# Patient Record
Sex: Female | Born: 1960
Health system: Southern US, Community
[De-identification: ages and names within clinical notes are randomized; demographics above are authoritative.]

## PROBLEM LIST (undated history)

## (undated) DIAGNOSIS — Z923 Personal history of irradiation: Secondary | ICD-10-CM

## (undated) DIAGNOSIS — Z853 Personal history of malignant neoplasm of breast: Secondary | ICD-10-CM

## (undated) DIAGNOSIS — R350 Frequency of micturition: Secondary | ICD-10-CM

## (undated) DIAGNOSIS — Z973 Presence of spectacles and contact lenses: Secondary | ICD-10-CM

## (undated) DIAGNOSIS — C50919 Malignant neoplasm of unspecified site of unspecified female breast: Secondary | ICD-10-CM

## (undated) DIAGNOSIS — D649 Anemia, unspecified: Secondary | ICD-10-CM

## (undated) DIAGNOSIS — Z9221 Personal history of antineoplastic chemotherapy: Secondary | ICD-10-CM

## (undated) DIAGNOSIS — N3281 Overactive bladder: Secondary | ICD-10-CM

## (undated) DIAGNOSIS — R319 Hematuria, unspecified: Secondary | ICD-10-CM

## (undated) DIAGNOSIS — D494 Neoplasm of unspecified behavior of bladder: Secondary | ICD-10-CM

## (undated) HISTORY — PX: BREAST LUMPECTOMY WITH AXILLARY LYMPH NODE DISSECTION: SHX5756

---

## 1985-04-10 HISTORY — PX: APPENDECTOMY: SHX54

## 2008-06-08 HISTORY — PX: NASAL SINUS SURGERY: SHX719

## 2009-08-16 ENCOUNTER — Encounter: Admission: RE | Admit: 2009-08-16 | Discharge: 2009-08-16 | Payer: Self-pay | Admitting: Family Medicine

## 2010-04-10 DIAGNOSIS — C50919 Malignant neoplasm of unspecified site of unspecified female breast: Secondary | ICD-10-CM

## 2010-04-10 DIAGNOSIS — Z923 Personal history of irradiation: Secondary | ICD-10-CM

## 2010-04-10 HISTORY — DX: Personal history of irradiation: Z92.3

## 2010-04-10 HISTORY — DX: Malignant neoplasm of unspecified site of unspecified female breast: C50.919

## 2010-04-10 HISTORY — PX: BREAST LUMPECTOMY: SHX2

## 2010-08-11 ENCOUNTER — Other Ambulatory Visit: Payer: Self-pay | Admitting: Family Medicine

## 2010-08-11 DIAGNOSIS — Z1231 Encounter for screening mammogram for malignant neoplasm of breast: Secondary | ICD-10-CM

## 2010-08-22 ENCOUNTER — Ambulatory Visit
Admission: RE | Admit: 2010-08-22 | Discharge: 2010-08-22 | Disposition: A | Discharge status: Home / Self Care | Payer: BC Managed Care – PPO | Source: Ambulatory Visit | Attending: Family Medicine | Admitting: Family Medicine

## 2010-08-22 DIAGNOSIS — Z1231 Encounter for screening mammogram for malignant neoplasm of breast: Secondary | ICD-10-CM

## 2010-08-24 ENCOUNTER — Other Ambulatory Visit: Payer: Self-pay | Admitting: Family Medicine

## 2010-08-24 DIAGNOSIS — R928 Other abnormal and inconclusive findings on diagnostic imaging of breast: Secondary | ICD-10-CM

## 2010-09-07 ENCOUNTER — Other Ambulatory Visit: Payer: Self-pay | Admitting: Family Medicine

## 2010-09-07 ENCOUNTER — Ambulatory Visit
Admission: RE | Admit: 2010-09-07 | Discharge: 2010-09-07 | Disposition: A | Discharge status: Home / Self Care | Payer: BC Managed Care – PPO | Source: Ambulatory Visit | Attending: Family Medicine | Admitting: Family Medicine

## 2010-09-07 ENCOUNTER — Other Ambulatory Visit: Payer: Self-pay | Admitting: Diagnostic Radiology

## 2010-09-07 DIAGNOSIS — R928 Other abnormal and inconclusive findings on diagnostic imaging of breast: Secondary | ICD-10-CM

## 2010-09-08 ENCOUNTER — Ambulatory Visit
Admission: RE | Admit: 2010-09-08 | Discharge: 2010-09-08 | Disposition: A | Discharge status: Home / Self Care | Payer: BC Managed Care – PPO | Source: Ambulatory Visit | Attending: Family Medicine | Admitting: Family Medicine

## 2010-09-08 ENCOUNTER — Other Ambulatory Visit: Payer: Self-pay | Admitting: Family Medicine

## 2010-09-08 DIAGNOSIS — C50912 Malignant neoplasm of unspecified site of left female breast: Secondary | ICD-10-CM

## 2010-09-08 DIAGNOSIS — R928 Other abnormal and inconclusive findings on diagnostic imaging of breast: Secondary | ICD-10-CM

## 2010-09-12 ENCOUNTER — Ambulatory Visit
Admission: RE | Admit: 2010-09-12 | Discharge: 2010-09-12 | Disposition: A | Discharge status: Home / Self Care | Payer: BC Managed Care – PPO | Source: Ambulatory Visit | Attending: Family Medicine | Admitting: Family Medicine

## 2010-09-12 DIAGNOSIS — C50912 Malignant neoplasm of unspecified site of left female breast: Secondary | ICD-10-CM

## 2010-09-12 MED ORDER — GADOBENATE DIMEGLUMINE 529 MG/ML IV SOLN
15.0000 mL | Freq: Once | INTRAVENOUS | Status: AC | PRN
  Administered 2010-09-12: 15 mL via INTRAVENOUS

## 2010-09-14 ENCOUNTER — Other Ambulatory Visit: Payer: Self-pay | Admitting: Oncology

## 2010-09-14 ENCOUNTER — Other Ambulatory Visit (HOSPITAL_COMMUNITY): Payer: Self-pay | Admitting: Surgery

## 2010-09-14 ENCOUNTER — Encounter (HOSPITAL_BASED_OUTPATIENT_CLINIC_OR_DEPARTMENT_OTHER): Payer: BC Managed Care – PPO | Admitting: Oncology

## 2010-09-14 ENCOUNTER — Other Ambulatory Visit: Payer: Self-pay | Admitting: Surgery

## 2010-09-14 DIAGNOSIS — Z853 Personal history of malignant neoplasm of breast: Secondary | ICD-10-CM

## 2010-09-14 DIAGNOSIS — C50219 Malignant neoplasm of upper-inner quadrant of unspecified female breast: Secondary | ICD-10-CM

## 2010-09-14 DIAGNOSIS — C50912 Malignant neoplasm of unspecified site of left female breast: Secondary | ICD-10-CM

## 2010-09-14 LAB — COMPREHENSIVE METABOLIC PANEL
AST: 14 U/L (ref 0–37)
Albumin: 3.5 g/dL (ref 3.5–5.2)
Alkaline Phosphatase: 57 U/L (ref 39–117)
BUN: 22 mg/dL (ref 6–23)
CO2: 29 mEq/L (ref 19–32)
Creatinine, Ser: 0.98 mg/dL (ref 0.50–1.10)
Glucose, Bld: 84 mg/dL (ref 70–99)
Potassium: 4.4 mEq/L (ref 3.5–5.3)
Sodium: 141 mEq/L (ref 135–145)
Total Bilirubin: 1 mg/dL (ref 0.3–1.2)

## 2010-09-14 LAB — CBC WITH DIFFERENTIAL/PLATELET
Eosinophils Absolute: 0.1 10*3/uL (ref 0.0–0.5)
MCH: 33 pg (ref 25.1–34.0)
MCV: 95.5 fL (ref 79.5–101.0)
Platelets: 252 10*3/uL (ref 145–400)
RDW: 12.7 % (ref 11.2–14.5)

## 2010-09-19 ENCOUNTER — Other Ambulatory Visit: Payer: Self-pay | Admitting: Surgery

## 2010-09-19 ENCOUNTER — Encounter (HOSPITAL_BASED_OUTPATIENT_CLINIC_OR_DEPARTMENT_OTHER)
Admission: RE | Admit: 2010-09-19 | Discharge: 2010-09-19 | Disposition: A | Discharge status: Home / Self Care | Payer: BC Managed Care – PPO | Source: Ambulatory Visit | Attending: Surgery | Admitting: Surgery

## 2010-09-19 ENCOUNTER — Ambulatory Visit
Admission: RE | Admit: 2010-09-19 | Discharge: 2010-09-19 | Disposition: A | Discharge status: Home / Self Care | Payer: BC Managed Care – PPO | Source: Ambulatory Visit | Attending: Surgery | Admitting: Surgery

## 2010-09-19 DIAGNOSIS — Z01811 Encounter for preprocedural respiratory examination: Secondary | ICD-10-CM

## 2010-09-21 ENCOUNTER — Ambulatory Visit (HOSPITAL_COMMUNITY)
Admission: RE | Admit: 2010-09-21 | Discharge: 2010-09-21 | Disposition: A | Discharge status: Home / Self Care | Payer: BC Managed Care – PPO | Source: Ambulatory Visit | Attending: Surgery | Admitting: Surgery

## 2010-09-21 ENCOUNTER — Ambulatory Visit
Admission: RE | Admit: 2010-09-21 | Discharge: 2010-09-21 | Disposition: A | Discharge status: Home / Self Care | Payer: BC Managed Care – PPO | Source: Ambulatory Visit | Attending: Surgery | Admitting: Surgery

## 2010-09-21 ENCOUNTER — Other Ambulatory Visit (INDEPENDENT_AMBULATORY_CARE_PROVIDER_SITE_OTHER): Payer: Self-pay | Admitting: Surgery

## 2010-09-21 ENCOUNTER — Ambulatory Visit (HOSPITAL_BASED_OUTPATIENT_CLINIC_OR_DEPARTMENT_OTHER)
Admission: RE | Admit: 2010-09-21 | Discharge: 2010-09-21 | Disposition: A | Discharge status: Home / Self Care | Payer: BC Managed Care – PPO | Source: Ambulatory Visit | Attending: Surgery | Admitting: Surgery

## 2010-09-21 DIAGNOSIS — Z01812 Encounter for preprocedural laboratory examination: Secondary | ICD-10-CM | POA: Insufficient documentation

## 2010-09-21 DIAGNOSIS — C50919 Malignant neoplasm of unspecified site of unspecified female breast: Secondary | ICD-10-CM | POA: Insufficient documentation

## 2010-09-21 DIAGNOSIS — C50912 Malignant neoplasm of unspecified site of left female breast: Secondary | ICD-10-CM

## 2010-09-21 DIAGNOSIS — D0590 Unspecified type of carcinoma in situ of unspecified breast: Secondary | ICD-10-CM | POA: Insufficient documentation

## 2010-09-21 DIAGNOSIS — Z01810 Encounter for preprocedural cardiovascular examination: Secondary | ICD-10-CM | POA: Insufficient documentation

## 2010-09-21 DIAGNOSIS — Z01818 Encounter for other preprocedural examination: Secondary | ICD-10-CM | POA: Insufficient documentation

## 2010-09-21 LAB — POCT HEMOGLOBIN-HEMACUE: Hemoglobin: 13.3 g/dL (ref 12.0–15.0)

## 2010-09-21 MED ORDER — TECHNETIUM TC 99M SULFUR COLLOID FILTERED
1.0000 | Freq: Once | INTRAVENOUS | Status: AC | PRN
  Administered 2010-09-21: 1 via INTRADERMAL

## 2010-09-23 NOTE — Op Note (Addendum)
Julie Villa, Julie Villa                  ACCOUNT NO.:  [id number]  MEDICAL RECORD NO.:  [id number]  LOCATION:  [facility]                          FACILITY:  [facility]  PHYSICIAN:  Currie Paris, M.D.DATE OF BIRTH:  Nov 24, 1960  DATE OF PROCEDURE:  09/21/2010 DATE OF DISCHARGE:                              OPERATIVE REPORT   PREOPERATIVE DIAGNOSIS:  Carcinoma left breast central to upper inner quadrant, clinical stage I.  POSTOPERATIVE DIAGNOSIS:  Carcinoma left breast central to upper inner quadrant, clinical stage I.  PROCEDURE:  Left needle local lumpectomy with blue dye injection and axillary sentinel lymph node biopsy (two lymph nodes).  SURGEON:  Currie Paris, MD  ANESTHESIA:  General.  CLINICAL HISTORY:  This is a 50 year old lady recently found to have a small left breast mass, which seemed to be just into the upper inner quadrant, but somewhat centrally located.  Clinically, this was a stage I.  After consultation with medicine and radiation oncologist, we elected to proceed to a needle-guided lumpectomy and sentinel node evaluation.  DESCRIPTION OF PROCEDURE:  I saw the patient in the holding area and she had no further questions.  We confirmed the left side as the operative side and I initialed that.  I reviewed the localizing films and the guidewire entered in the 12 o'clock position on and on ML view appeared to track inferiorly.  With the patient supine, it appeared to track more laterally.  The patient was taken to the operating room and after satisfactory general anesthesia had been obtained, the time-out was done.  I injected about 5 mL of dilute methylene blue subareolarly on the left.  This was massaged in.  Full prep and drape was done.  I made a curvilinear incision just above the areolar margin and elevated a thin skin flap until I could manipulate the guidewire into the wound. I tracked the guidewire through the breast tissue as it appeared to  be several centimeters before we got to the actual lesion and then grasped the tract of the guidewire and the wire with an Allis clamp and elevated it.  I then took what appeared as a cylinder of tissue around the guidewire until I was beyond the tip.  I could palpate what I thought was the tumor, which was very small perhaps only a centimeter in the mass and it appeared to be along the more medial edge.  However, by palpation and visually I appeared to be around it.  The specimen was inked and a specimen mammogram was done showing the clip and what appeared to be a lesion in this tissue.  I went back and took some extra medial margin from the area that I thought was where the tumor was palpably close.  We then made sure everything was dry and I irrigated.  I put in 20 mL of 0.25% plain Marcaine to help with postop pain relief.  I put some clips and marked the margins of the excision.  I irrigated again and closed in layers with 3-0 Vicryl, 4-0 Monocryl, subcuticular, and Dermabond.  Attention was turned to the axilla and the Neoprobe identified a hot area.  I made a  transverse incision there, divided subcutaneous tissues, and entered the axillary fat pad.  Almost immediately I saw blue lymphatic and traced that superiorly and a little bit anteriorly and saw a large blue lymph node that appeared soft and this was excised.  It had counts of about 1200.  There was a second lymphatic that I had noticed and I traced that to a very small second node that was removed and was also blue and had counts about 400.  The second node removed, all counts that were in a background range of about 0 to 10.  There was no palpable adenopathy and I did not see any other blue lymph nodes or blue lymphatics.  Again I irrigated, put in Marcaine, and closed in layers with 3-0 Vicryl, 4-0 Monocryl, subcuticular plus Dermabond.  The patient tolerated the procedure well.  There were no complications. All  counts were correct.     Currie Paris, M.D.     CJS/MEDQ  D:  09/21/2010  T:  09/22/2010  Job:  [id number]  cc:   Molly Maduro L. Foy [place], M.D. Lurline Hare, M.D. Pierce Crane, M.D., F.R.C.P.C.  Electronically Signed by Cyndia Bent M.D. on 09/26/2010 05:51:22 PM

## 2010-09-30 ENCOUNTER — Encounter (INDEPENDENT_AMBULATORY_CARE_PROVIDER_SITE_OTHER): Payer: Self-pay | Admitting: Surgery

## 2010-09-30 DIAGNOSIS — C50919 Malignant neoplasm of unspecified site of unspecified female breast: Secondary | ICD-10-CM

## 2010-09-30 DIAGNOSIS — Z853 Personal history of malignant neoplasm of breast: Secondary | ICD-10-CM | POA: Insufficient documentation

## 2010-10-06 ENCOUNTER — Encounter (INDEPENDENT_AMBULATORY_CARE_PROVIDER_SITE_OTHER): Payer: Self-pay | Admitting: Surgery

## 2010-10-13 ENCOUNTER — Encounter (INDEPENDENT_AMBULATORY_CARE_PROVIDER_SITE_OTHER): Payer: Self-pay | Admitting: Surgery

## 2010-10-13 ENCOUNTER — Ambulatory Visit (INDEPENDENT_AMBULATORY_CARE_PROVIDER_SITE_OTHER): Payer: BC Managed Care – PPO | Admitting: Surgery

## 2010-10-13 VITALS — Temp 97.8°F | Ht 65.0 in | Wt 163.6 lb

## 2010-10-13 DIAGNOSIS — C50919 Malignant neoplasm of unspecified site of unspecified female breast: Secondary | ICD-10-CM

## 2010-10-13 NOTE — Patient Instructions (Signed)
I will need to see you in a month or so. You need to see Dr Michell Heinrich.

## 2010-10-13 NOTE — Progress Notes (Signed)
CC: Post left lumpectomy and sentinel node  HPI: This patient comes in for post op follow-up. She underwent needle guided lumpectomy and sentinel node evaluation of the left breast on 09/21/10. She feels that she is doing well.  PE: General: The patient appears to be healthy, NAD Breast: Both the lumpectomy site at the 12:00 position of the left breast and the axillary incision are healing nicely. There is no evidence of infection or other problems.   IMPRESSION: The patient is doing well S/P lumpectomy and sentinel node.  DATA REVIWED: Pathology report showed a 6 mm I. D. C. and a 3 mm area of ILC. There was some associated LCIS. Margins are negative nodes are negative. I reviewed the pathology report with the patient and gave her a copy.  PLAN: I will see her back in about a month. She needs to followup with Dr. Michell Heinrich to make plans for radiation therapy.

## 2010-10-31 ENCOUNTER — Ambulatory Visit
Admission: RE | Admit: 2010-10-31 | Discharge: 2010-10-31 | Disposition: A | Discharge status: Home / Self Care | Payer: BC Managed Care – PPO | Source: Ambulatory Visit | Attending: Oncology | Admitting: Oncology

## 2010-10-31 DIAGNOSIS — Z853 Personal history of malignant neoplasm of breast: Secondary | ICD-10-CM

## 2010-11-01 ENCOUNTER — Encounter (HOSPITAL_BASED_OUTPATIENT_CLINIC_OR_DEPARTMENT_OTHER): Payer: BC Managed Care – PPO | Admitting: Oncology

## 2010-11-01 DIAGNOSIS — C50219 Malignant neoplasm of upper-inner quadrant of unspecified female breast: Secondary | ICD-10-CM

## 2010-11-09 ENCOUNTER — Ambulatory Visit
Admission: RE | Admit: 2010-11-09 | Discharge: 2010-11-09 | Disposition: A | Discharge status: Home / Self Care | Payer: BC Managed Care – PPO | Source: Ambulatory Visit | Attending: Radiation Oncology | Admitting: Radiation Oncology

## 2010-11-09 DIAGNOSIS — N644 Mastodynia: Secondary | ICD-10-CM | POA: Insufficient documentation

## 2010-11-09 DIAGNOSIS — C50219 Malignant neoplasm of upper-inner quadrant of unspecified female breast: Secondary | ICD-10-CM | POA: Insufficient documentation

## 2010-11-09 DIAGNOSIS — Z510 Encounter for antineoplastic radiation therapy: Secondary | ICD-10-CM | POA: Insufficient documentation

## 2010-11-09 DIAGNOSIS — L538 Other specified erythematous conditions: Secondary | ICD-10-CM | POA: Insufficient documentation

## 2010-11-30 ENCOUNTER — Ambulatory Visit (INDEPENDENT_AMBULATORY_CARE_PROVIDER_SITE_OTHER): Payer: BC Managed Care – PPO | Admitting: Surgery

## 2010-11-30 ENCOUNTER — Encounter (INDEPENDENT_AMBULATORY_CARE_PROVIDER_SITE_OTHER): Payer: Self-pay | Admitting: Surgery

## 2010-11-30 VITALS — BP 122/74 | HR 80

## 2010-11-30 DIAGNOSIS — Z9889 Other specified postprocedural states: Secondary | ICD-10-CM

## 2010-11-30 DIAGNOSIS — C50919 Malignant neoplasm of unspecified site of unspecified female breast: Secondary | ICD-10-CM

## 2010-11-30 NOTE — Patient Instructions (Signed)
Come to see me two to four weeks after completion of radiation.

## 2010-11-30 NOTE — Progress Notes (Signed)
CC: Post left lumpectomy and sentinel node  HPI: This patient comes in for post op follow-up. She underwent needle guided lumpectomy and sentinel node evaluation of the left breast on 09/21/10. She feels that she is doing well. She has started radiation  PE: General: The patient appears to be healthy, NAD Breast: Both the lumpectomy site at the 12:00 position of the left breast and the axillary incision are healing nicely. There is no evidence of infection or other problems.   IMPRESSION: The patient is doing well S/P lumpectomy and sentinel node.  DATA REVIWED: No new data  PLAN: I will see her back in about two months, after radiation is done

## 2011-01-24 ENCOUNTER — Other Ambulatory Visit: Payer: Self-pay | Admitting: Oncology

## 2011-01-24 ENCOUNTER — Encounter (HOSPITAL_BASED_OUTPATIENT_CLINIC_OR_DEPARTMENT_OTHER): Payer: BC Managed Care – PPO | Admitting: Oncology

## 2011-01-24 DIAGNOSIS — C50219 Malignant neoplasm of upper-inner quadrant of unspecified female breast: Secondary | ICD-10-CM

## 2011-01-24 LAB — CBC WITH DIFFERENTIAL/PLATELET
BASO%: 1.2 % (ref 0.0–2.0)
Basophils Absolute: 0 10*3/uL (ref 0.0–0.1)
EOS%: 7.1 % — ABNORMAL HIGH (ref 0.0–7.0)
HGB: 12.9 g/dL (ref 11.6–15.9)
MCH: 33 pg (ref 25.1–34.0)
MCV: 94.9 fL (ref 79.5–101.0)
MONO%: 7.8 % (ref 0.0–14.0)
RBC: 3.93 10*6/uL (ref 3.70–5.45)
RDW: 12.4 % (ref 11.2–14.5)
lymph#: 0.8 10*3/uL — ABNORMAL LOW (ref 0.9–3.3)

## 2011-01-24 LAB — COMPREHENSIVE METABOLIC PANEL
ALT: 11 U/L (ref 0–35)
AST: 13 U/L (ref 0–37)
Albumin: 4.1 g/dL (ref 3.5–5.2)
Alkaline Phosphatase: 62 U/L (ref 39–117)
BUN: 18 mg/dL (ref 6–23)
Calcium: 9.4 mg/dL (ref 8.4–10.5)
Chloride: 105 mEq/L (ref 96–112)
Potassium: 4.4 mEq/L (ref 3.5–5.3)
Sodium: 140 mEq/L (ref 135–145)
Total Protein: 6.4 g/dL (ref 6.0–8.3)

## 2011-01-26 ENCOUNTER — Ambulatory Visit
Admission: RE | Admit: 2011-01-26 | Discharge: 2011-01-26 | Disposition: A | Discharge status: Home / Self Care | Payer: BC Managed Care – PPO | Source: Ambulatory Visit | Attending: Radiation Oncology | Admitting: Radiation Oncology

## 2011-01-26 ENCOUNTER — Other Ambulatory Visit: Payer: Self-pay | Admitting: Radiation Oncology

## 2011-01-26 DIAGNOSIS — C50919 Malignant neoplasm of unspecified site of unspecified female breast: Secondary | ICD-10-CM

## 2011-02-03 ENCOUNTER — Ambulatory Visit (INDEPENDENT_AMBULATORY_CARE_PROVIDER_SITE_OTHER): Payer: BC Managed Care – PPO | Admitting: Surgery

## 2011-02-03 ENCOUNTER — Encounter (INDEPENDENT_AMBULATORY_CARE_PROVIDER_SITE_OTHER): Payer: Self-pay | Admitting: Surgery

## 2011-02-03 VITALS — BP 116/68 | HR 71 | Temp 97.5°F | Ht 65.5 in | Wt 172.4 lb

## 2011-02-03 DIAGNOSIS — C50919 Malignant neoplasm of unspecified site of unspecified female breast: Secondary | ICD-10-CM

## 2011-02-03 NOTE — Progress Notes (Signed)
NAME: [name]       DOB: Dec 30, 1960           DATE: 02/03/2011       MRN: [id number]   Julie Villa is a 50 y.o.Marland Kitchenfemale who presents for routine followup of her Left breast cancerdiagnosed in mid 2012 and treated with lumpectomy, SLN and radiation follwed by Tamoxifen. She has no problems or concerns on either side.  PFSH: She has had no significant changes since the last visit here.  ROS: There have been no significant changes since the last visit here  EXAM: General: The patient is alert, oriented, generally healty appearing, NAD. Mood and affect are normal.  Breasts:  the left breast has some edema and increased pigmentation from the radiation and is mildly tender. No other problems noted  Lymphatics: She has no axillary or supraclavicular adenopathy on either side.  Extremities: Full ROM of the surgical side with no lymphedema noted.  Data Reviewed: Notes from oncologists  Impression: Doing well, with no evidence of recurrent cancer or new cancer  Plan: Will continue to follow up on an annual basis here, next visit in 9months

## 2011-03-15 ENCOUNTER — Telehealth: Payer: Self-pay | Admitting: *Deleted

## 2011-03-15 NOTE — Telephone Encounter (Signed)
patient confirmed over the phone the new date and time on 05-16-2011 starting at 9:30am

## 2011-04-17 ENCOUNTER — Ambulatory Visit
Admission: RE | Admit: 2011-04-17 | Discharge: 2011-04-17 | Disposition: A | Discharge status: Home / Self Care | Payer: BC Managed Care – PPO | Source: Ambulatory Visit | Attending: Radiation Oncology | Admitting: Radiation Oncology

## 2011-04-17 DIAGNOSIS — C50919 Malignant neoplasm of unspecified site of unspecified female breast: Secondary | ICD-10-CM

## 2011-04-20 ENCOUNTER — Ambulatory Visit
Admission: RE | Admit: 2011-04-20 | Discharge: 2011-04-20 | Disposition: A | Discharge status: Home / Self Care | Payer: BC Managed Care – PPO | Source: Ambulatory Visit | Attending: Radiation Oncology | Admitting: Radiation Oncology

## 2011-04-20 DIAGNOSIS — C50219 Malignant neoplasm of upper-inner quadrant of unspecified female breast: Secondary | ICD-10-CM

## 2011-04-20 NOTE — Progress Notes (Signed)
CC:   Robert A. Nicholos Johns, M.D. Currie Paris, M.D. Pierce Crane, M.D., F.R.C.P.C.  DIAGNOSIS:  Left breast cancer.  PREVIOUS RADIATION:  Radiation to a total dose of 61 Gy completed 01/03/2011.  INTERVAL SINCE TREATMENT:  4 months.  INTERVAL HISTORY:  Julie Villa reports for followup today.  I just saw back in October and wanted to see her back in 3 months to make sure she was doing okay.  She had negative mammograms done on the 7th of January. She is very excited about that.  They had a difficult holiday with all of their losses, but they have a new physician for their daughter and they are pretty excited about that.  From a breast related standpoint, she has no complaints.  PHYSICAL EXAMINATION:  She is a pleasant female in no distress sitting comfortably on the exam room table.  Temperature 98, pulse 60, blood pressure 106/70, weight 178 pounds.  The left breast is darker than the right.  It is slightly swollen as well.  Her areola is dry.  Nothing palpable of concern in the axilla.  No palpable supraclavicular adenopathy.  She has postoperative change around her scar.  No other palpable abnormalities of the left breast.  IMPRESSION:  Left breast cancer with resolving acute effects of treatment, possible permanent hyperpigmentation.  RECOMMENDATION:  Ariely looks great.  She is tolerating her tamoxifen well.  I have released her from followup with me.  I told her she can call with any questions or concerns in the future.  I discussed letting 6 months pass before she pursued any intervention for the darkening in her breast.  I told her to followup with dermatologist if she was interested in any skin lightening creams.  She agreed to do so.    ______________________________ Lurline Hare, M.D. SW/MEDQ  D:  04/20/2011  T:  04/20/2011  Job:  [id number]

## 2011-04-20 NOTE — Progress Notes (Signed)
Here for  Follow up post completion of radiation to left breast in October 2012. Tolerating tamoxifen fine. No changes in health history. Denies pain or any other problems.

## 2011-04-21 ENCOUNTER — Encounter (INDEPENDENT_AMBULATORY_CARE_PROVIDER_SITE_OTHER): Payer: Self-pay | Admitting: Surgery

## 2011-05-15 ENCOUNTER — Other Ambulatory Visit: Payer: Self-pay

## 2011-05-15 DIAGNOSIS — Z853 Personal history of malignant neoplasm of breast: Secondary | ICD-10-CM

## 2011-05-16 ENCOUNTER — Ambulatory Visit (HOSPITAL_BASED_OUTPATIENT_CLINIC_OR_DEPARTMENT_OTHER): Payer: BC Managed Care – PPO | Admitting: Oncology

## 2011-05-16 ENCOUNTER — Other Ambulatory Visit (HOSPITAL_BASED_OUTPATIENT_CLINIC_OR_DEPARTMENT_OTHER): Payer: BC Managed Care – PPO | Admitting: Lab

## 2011-05-16 VITALS — BP 120/80 | HR 65 | Temp 97.9°F | Ht 65.5 in | Wt 177.8 lb

## 2011-05-16 DIAGNOSIS — Z79810 Long term (current) use of selective estrogen receptor modulators (SERMs): Secondary | ICD-10-CM

## 2011-05-16 DIAGNOSIS — Z853 Personal history of malignant neoplasm of breast: Secondary | ICD-10-CM

## 2011-05-16 DIAGNOSIS — C50219 Malignant neoplasm of upper-inner quadrant of unspecified female breast: Secondary | ICD-10-CM

## 2011-05-16 LAB — COMPREHENSIVE METABOLIC PANEL
ALT: 11 U/L (ref 0–35)
Alkaline Phosphatase: 41 U/L (ref 39–117)
CO2: 26 mEq/L (ref 19–32)
Creatinine, Ser: 0.84 mg/dL (ref 0.50–1.10)
Sodium: 141 mEq/L (ref 135–145)
Total Bilirubin: 0.9 mg/dL (ref 0.3–1.2)

## 2011-05-16 LAB — CBC WITH DIFFERENTIAL/PLATELET
BASO%: 0.7 % (ref 0.0–2.0)
HCT: 35.5 % (ref 34.8–46.6)
LYMPH%: 28.8 % (ref 14.0–49.7)
MCH: 32.8 pg (ref 25.1–34.0)
MCHC: 34.8 g/dL (ref 31.5–36.0)
MCV: 94.1 fL (ref 79.5–101.0)
MONO#: 0.2 10*3/uL (ref 0.1–0.9)
MONO%: 7.8 % (ref 0.0–14.0)
NEUT%: 58.9 % (ref 38.4–76.8)
Platelets: 213 10*3/uL (ref 145–400)
RBC: 3.78 10*6/uL (ref 3.70–5.45)
WBC: 3.2 10*3/uL — ABNORMAL LOW (ref 3.9–10.3)

## 2011-05-16 LAB — FOLLICLE STIMULATING HORMONE: FSH: 22.4 m[IU]/mL

## 2011-05-16 NOTE — Progress Notes (Signed)
Hematology and Oncology Follow Up Visit  Julie Villa [id number] January 13, 1961 50 y.o. 05/16/2011 11:08 AM PCP Dr Lurline Hare;  Principle Diagnosis: 51 yo with hx of T1b N) er/pr+ breast cancer s/p lumpectomy and xrt completed 09/21/10, on tamoxifen  Interim History:  There have been no intercurrent illness, hospitalizations or medication changes.She is tolerating tam well without menses. She has had no other intercurrent problems.  Medications: I have reviewed the patient's current medications.  Allergies:  Allergies  Allergen Reactions  . Penicillins Rash  . Sulfa Antibiotics Rash    Past Medical History, Surgical history, Social history, and Family History were reviewed and updated.  Review of Systems: Constitutional:  Negative for fever, chills, night sweats, anorexia, weight loss, pain. Cardiovascular: no chest pain or dyspnea on exertion Respiratory: no cough, shortness of breath, or wheezing Neurological: negative Dermatological: negative ENT: negative Skin Gastrointestinal: negative Genito-Urinary: negative Hematological and Lymphatic: negative Breast: negative Musculoskeletal: negative Remaining ROS negative.  Physical Exam: Blood pressure 120/80, pulse 65, temperature 97.9 F (36.6 C), height 5' 5.5" (1.664 m), weight 177 lb 12.8 oz (80.65 kg), last menstrual period 04/06/2011. ECOG: 0 \\General  appearance: alert, cooperative and appears stated age Head: Normocephalic, without obvious abnormality, atraumatic Neck: no adenopathy, no carotid bruit, no JVD, supple, symmetrical, trachea midline and thyroid not enlarged, symmetric, no tenderness/mass/nodules Lymph nodes: Cervical, supraclavicular, and axillary nodes normal. Cardiac : regular rate and rhythm, no murmurs or gallops Pulmonary:clear to auscultation bilaterally and normal percussion bilaterally Breasts: inspection negative, no nipple discharge or bleeding, no masses or nodularity palpable Abdomen:soft,  non-tender; bowel sounds normal; no masses,  no organomegaly Extremities negative Neuro: alert, oriented, normal speech, no focal findings or movement disorder noted  Lab Results: Lab Results  Component Value Date   WBC 3.2* 05/16/2011   HGB 12.4 05/16/2011   HCT 35.5 05/16/2011   MCV 94.1 05/16/2011   PLT 213 05/16/2011     Chemistry      Component Value Date/Time   NA 140 01/24/2011 0909   NA 140 01/24/2011 0909   K 4.4 01/24/2011 0909   K 4.4 01/24/2011 0909   CL 105 01/24/2011 0909   CL 105 01/24/2011 0909   CO2 23 01/24/2011 0909   CO2 23 01/24/2011 0909   BUN 18 01/24/2011 0909   BUN 18 01/24/2011 0909   CREATININE 0.78 01/24/2011 0909   CREATININE 0.78 01/24/2011 0909      Component Value Date/Time   CALCIUM 9.4 01/24/2011 0909   CALCIUM 9.4 01/24/2011 0909   ALKPHOS 62 01/24/2011 0909   ALKPHOS 62 01/24/2011 0909   AST 13 01/24/2011 0909   AST 13 01/24/2011 0909   ALT 11 01/24/2011 0909   ALT 11 01/24/2011 0909   BILITOT 1.2 01/24/2011 0909   BILITOT 1.2 01/24/2011 0909      .pathology. Radiological Studies: chest X-ray n/a Mammogram 04/27/11-wnl Bone density n/a  Impression and Plan: Julie Villa is doing well, I will see he rin 6 months for f/u. If hormone levels are likely to show a premenopausal state , she will continue on tamoxifen.   More than 50% of the visit was spent in patient-related counselling   Pierce Crane, MD 2/5/201311:08 AM

## 2011-05-24 LAB — ESTRADIOL, ULTRA SENS: Estradiol, Ultra Sensitive: 291 pg/mL

## 2011-09-11 ENCOUNTER — Encounter (INDEPENDENT_AMBULATORY_CARE_PROVIDER_SITE_OTHER): Payer: Self-pay | Admitting: Surgery

## 2011-10-31 ENCOUNTER — Ambulatory Visit (INDEPENDENT_AMBULATORY_CARE_PROVIDER_SITE_OTHER): Payer: BC Managed Care – PPO | Admitting: Surgery

## 2011-10-31 ENCOUNTER — Encounter (INDEPENDENT_AMBULATORY_CARE_PROVIDER_SITE_OTHER): Payer: Self-pay | Admitting: Surgery

## 2011-10-31 VITALS — BP 120/70 | HR 72 | Temp 97.4°F | Resp 16 | Ht 65.25 in | Wt 183.5 lb

## 2011-10-31 DIAGNOSIS — Z853 Personal history of malignant neoplasm of breast: Secondary | ICD-10-CM

## 2011-10-31 NOTE — Patient Instructions (Signed)
Continue annual follow-up with [place]

## 2011-10-31 NOTE — Progress Notes (Signed)
NAME: [name]       DOB: 25-Aug-1960           DATE: 10/31/2011       MRN: [id number]   Julie Villa is a 51 y.o.Marland Kitchenfemale who presents for routine followup of her Left breast cancer diagnosed in mid 2012 and treated with lumpectomy, SLN and radiation follwed by Tamoxifen. She has no problems or concerns on either side.  PFSH: She has had no significant changes since the last visit here.  ROS: There have been no significant changes since the last visit here  EXAM: General: The patient is alert, oriented, generally healty appearing, NAD. Mood and affect are normal.  Breasts:  the left breast has minimal edema and increased pigmentation from the radiation and is mildly tender. No other problems noted  Lymphatics: She has no axillary or supraclavicular adenopathy on either side.  Extremities: Full ROM of the surgical side with no lymphedema noted.  Data Reviewed: Notes from oncologists  Impression: Doing well, with no evidence of recurrent cancer or new cancer  Plan: Will continue to follow up on an annual basis here.

## 2011-11-13 ENCOUNTER — Telehealth: Payer: Self-pay | Admitting: Oncology

## 2011-11-13 NOTE — Telephone Encounter (Signed)
S/w the pt and she is aware of the change of appt time on her schedule for tomorrow

## 2011-11-14 ENCOUNTER — Telehealth: Payer: Self-pay | Admitting: Oncology

## 2011-11-14 ENCOUNTER — Ambulatory Visit (HOSPITAL_BASED_OUTPATIENT_CLINIC_OR_DEPARTMENT_OTHER): Payer: BC Managed Care – PPO | Admitting: Family

## 2011-11-14 ENCOUNTER — Other Ambulatory Visit: Payer: BC Managed Care – PPO | Admitting: Lab

## 2011-11-14 ENCOUNTER — Ambulatory Visit: Payer: BC Managed Care – PPO | Admitting: Oncology

## 2011-11-14 ENCOUNTER — Encounter: Payer: Self-pay | Admitting: Family

## 2011-11-14 ENCOUNTER — Other Ambulatory Visit (HOSPITAL_BASED_OUTPATIENT_CLINIC_OR_DEPARTMENT_OTHER): Payer: BC Managed Care – PPO | Admitting: Lab

## 2011-11-14 VITALS — BP 108/70 | HR 60 | Temp 97.6°F | Resp 20 | Ht 65.25 in | Wt 180.7 lb

## 2011-11-14 DIAGNOSIS — C50219 Malignant neoplasm of upper-inner quadrant of unspecified female breast: Secondary | ICD-10-CM

## 2011-11-14 DIAGNOSIS — Z853 Personal history of malignant neoplasm of breast: Secondary | ICD-10-CM

## 2011-11-14 DIAGNOSIS — Z171 Estrogen receptor negative status [ER-]: Secondary | ICD-10-CM

## 2011-11-14 LAB — CBC WITH DIFFERENTIAL/PLATELET
Basophils Absolute: 0 10*3/uL (ref 0.0–0.1)
Eosinophils Absolute: 0.1 10*3/uL (ref 0.0–0.5)
HCT: 36.1 % (ref 34.8–46.6)
HGB: 12.4 g/dL (ref 11.6–15.9)
MCV: 94.5 fL (ref 79.5–101.0)
NEUT#: 2.3 10*3/uL (ref 1.5–6.5)
NEUT%: 54.8 % (ref 38.4–76.8)
RDW: 12.1 % (ref 11.2–14.5)
lymph#: 1.4 10*3/uL (ref 0.9–3.3)

## 2011-11-14 LAB — COMPREHENSIVE METABOLIC PANEL
Albumin: 3.8 g/dL (ref 3.5–5.2)
BUN: 26 mg/dL — ABNORMAL HIGH (ref 6–23)
CO2: 28 mEq/L (ref 19–32)
Calcium: 9.5 mg/dL (ref 8.4–10.5)
Chloride: 105 mEq/L (ref 96–112)
Glucose, Bld: 80 mg/dL (ref 70–99)
Potassium: 4.2 mEq/L (ref 3.5–5.3)

## 2011-11-14 LAB — FOLLICLE STIMULATING HORMONE: FSH: 51.8 m[IU]/mL

## 2011-11-14 NOTE — Patient Instructions (Signed)
Remain on Tamoxifen. Return in 6 months.

## 2011-11-14 NOTE — Progress Notes (Signed)
Hematology and Oncology Follow Up Visit  Julie Villa [id number] 05-20-1960 51 y.o. 11/14/2011 10:36 AM  Principle Diagnosis: 50 yo with hx of T1b N0 er/pr+ breast cancer s/p lumpectomy and xrt completed 09/21/10, on tamoxifen since October 2012.  Interim History:  There have been no intercurrent illness, hospitalizations or medication changes.She is tolerating tamoxifen well. No appreciable side effects from tamoxifen, no hot flashes or joint aches. Does notice mild vaginal dryness and we discuss several options for management.   Mammogram was January 2013, normal by her report. No headache or blurred vision. No cough or shortness of breath. No abdominal pain or new bone pain. Bowel and bladder function are normal. Appetite is good, with adequate fluid intake. Remainder of the 10 point  review of systems is negative.  Medications: I have reviewed the patient's current medications.  Allergies:  Allergies  Allergen Reactions  . Penicillins Rash  . Sulfa Antibiotics Rash    Past Medical History, Surgical history, Social history, and Family History were reviewed and updated.  Physical Exam: Blood pressure 108/70, pulse 60, temperature 97.6 F (36.4 C), resp. rate 20, height 5' 5.25" (1.657 m), weight 180 lb 11.2 oz (81.965 kg). ECOG: 0 \\General  appearance: alert, cooperative and appears stated age Head: Normocephalic, without obvious abnormality, atraumatic Neck: no adenopathy, no carotid bruit, no JVD, supple, symmetrical, trachea midline and thyroid not enlarged, symmetric, no tenderness/mass/nodules Lymph nodes: Cervical, supraclavicular, and axillary nodes normal. Cardiac : regular rate and rhythm, no murmurs or gallops Pulmonary:clear to auscultation bilaterally and normal percussion bilaterally BREAST EXAM: In the supine position, with the right arm over the head, the right nipple is everted. No periareolar edema or nipple discharge. No mass in any quadrant or subareolar region. No  redness of the skin. No right axillary adenopathy. With the left arm over the head, the left nipple is everted. No periareolar edema or nipple discharge. No mass in any quadrant or subareolar region.At the 12 o'clock position, a 5 cm well healed curved incision.  Glandular prominence at the 3 o'clock position, no discrete mass. Faint redness of the skin due to radiation changes. No left axillary adenopathy. Contour of the breasts is symmetrical. Abdomen:soft, non-tender; bowel sounds normal; no masses,  no organomegaly Extremities negative Neuro: alert, oriented, normal speech, no focal findings or movement disorder noted  Lab Results: Lab Results  Component Value Date   WBC 4.2 11/14/2011   HGB 12.4 11/14/2011   HCT 36.1 11/14/2011   MCV 94.5 11/14/2011   PLT 198 11/14/2011     Chemistry      Component Value Date/Time   NA 141 05/16/2011 0957   K 4.6 05/16/2011 0957   CL 106 05/16/2011 0957   CO2 26 05/16/2011 0957   BUN 16 05/16/2011 0957   CREATININE 0.84 05/16/2011 0957      Component Value Date/Time   CALCIUM 9.3 05/16/2011 0957   ALKPHOS 41 05/16/2011 0957   AST 14 05/16/2011 0957   ALT 11 05/16/2011 0957   BILITOT 0.9 05/16/2011 0957     Impression: 1. Continue Tamoxifen for a total of 5 years. When she becomes postmenopausal, will likely switch to aromatase inhibitor.  2. Mammogram January 2014.   Colman Cater, FNP

## 2011-11-14 NOTE — Telephone Encounter (Signed)
gve the pt her feb 2014 appt calendar °

## 2011-11-21 LAB — ESTRADIOL, ULTRA SENS: Estradiol, Ultra Sensitive: 5 pg/mL

## 2012-02-09 ENCOUNTER — Other Ambulatory Visit: Payer: Self-pay | Admitting: Oncology

## 2012-02-09 DIAGNOSIS — Z853 Personal history of malignant neoplasm of breast: Secondary | ICD-10-CM

## 2012-04-15 ENCOUNTER — Other Ambulatory Visit: Payer: Self-pay | Admitting: Radiation Oncology

## 2012-04-15 DIAGNOSIS — Z853 Personal history of malignant neoplasm of breast: Secondary | ICD-10-CM

## 2012-04-22 ENCOUNTER — Ambulatory Visit
Admission: RE | Admit: 2012-04-22 | Discharge: 2012-04-22 | Disposition: A | Discharge status: Home / Self Care | Payer: BC Managed Care – PPO | Source: Ambulatory Visit | Attending: Radiation Oncology | Admitting: Radiation Oncology

## 2012-04-22 DIAGNOSIS — Z853 Personal history of malignant neoplasm of breast: Secondary | ICD-10-CM

## 2012-05-09 ENCOUNTER — Telehealth: Payer: Self-pay | Admitting: *Deleted

## 2012-05-09 NOTE — Telephone Encounter (Signed)
Called and spoke with patient about rescheduling her appt. Confirmed appt. On 05/21/12 at 1015 with Ramond Craver.  Patient will become a Dr. Welton Flakes patient.

## 2012-05-10 ENCOUNTER — Encounter: Payer: Self-pay | Admitting: Oncology

## 2012-05-20 ENCOUNTER — Other Ambulatory Visit: Payer: Self-pay | Admitting: Family

## 2012-05-20 DIAGNOSIS — Z853 Personal history of malignant neoplasm of breast: Secondary | ICD-10-CM

## 2012-05-21 ENCOUNTER — Ambulatory Visit: Payer: BC Managed Care – PPO | Admitting: Oncology

## 2012-05-21 ENCOUNTER — Encounter: Payer: Self-pay | Admitting: Nurse Practitioner

## 2012-05-21 ENCOUNTER — Telehealth: Payer: Self-pay | Admitting: Oncology

## 2012-05-21 ENCOUNTER — Ambulatory Visit (HOSPITAL_BASED_OUTPATIENT_CLINIC_OR_DEPARTMENT_OTHER): Payer: BC Managed Care – PPO | Admitting: Nurse Practitioner

## 2012-05-21 ENCOUNTER — Other Ambulatory Visit (HOSPITAL_BASED_OUTPATIENT_CLINIC_OR_DEPARTMENT_OTHER): Payer: BC Managed Care – PPO | Admitting: Lab

## 2012-05-21 VITALS — BP 105/63 | HR 64 | Temp 98.5°F | Resp 20 | Ht 65.25 in | Wt 186.3 lb

## 2012-05-21 DIAGNOSIS — Z170 Estrogen receptor positive status [ER+]: Secondary | ICD-10-CM

## 2012-05-21 DIAGNOSIS — C50919 Malignant neoplasm of unspecified site of unspecified female breast: Secondary | ICD-10-CM

## 2012-05-21 DIAGNOSIS — C50219 Malignant neoplasm of upper-inner quadrant of unspecified female breast: Secondary | ICD-10-CM

## 2012-05-21 DIAGNOSIS — Z853 Personal history of malignant neoplasm of breast: Secondary | ICD-10-CM

## 2012-05-21 LAB — CBC WITH DIFFERENTIAL/PLATELET
Basophils Absolute: 0 10*3/uL (ref 0.0–0.1)
Eosinophils Absolute: 0.3 10*3/uL (ref 0.0–0.5)
HCT: 34.9 % (ref 34.8–46.6)
HGB: 12.3 g/dL (ref 11.6–15.9)
LYMPH%: 31.9 % (ref 14.0–49.7)
MONO#: 0.3 10*3/uL (ref 0.1–0.9)
NEUT#: 2 10*3/uL (ref 1.5–6.5)
NEUT%: 52.3 % (ref 38.4–76.8)
Platelets: 191 10*3/uL (ref 145–400)
WBC: 3.8 10*3/uL — ABNORMAL LOW (ref 3.9–10.3)

## 2012-05-21 LAB — COMPREHENSIVE METABOLIC PANEL (CC13)
AST: 15 U/L (ref 5–34)
Albumin: 3.2 g/dL — ABNORMAL LOW (ref 3.5–5.0)
BUN: 20.7 mg/dL (ref 7.0–26.0)
Calcium: 9.1 mg/dL (ref 8.4–10.4)
Chloride: 107 mEq/L (ref 98–107)
Glucose: 66 mg/dl — ABNORMAL LOW (ref 70–99)
Potassium: 4.2 mEq/L (ref 3.5–5.1)

## 2012-05-21 LAB — FOLLICLE STIMULATING HORMONE: FSH: 21.9 m[IU]/mL

## 2012-05-21 NOTE — Progress Notes (Signed)
[facility] OFFICE PROGRESS NOTE  Villa, Julie Cheadle, MD  DIAGNOSIS: 52 yo with hx of T1b N0 er/pr+ breast cancer s/p lumpectomy and xrt completed, on tamoxifen since October 2012   PAST THERAPY:  LEFT lumpectomy on 09/21/2010 with invasive low grade (1) ductal ca spanning 0.6cm , low grade DCIS, invasive lobular ca spanning 0.3cm (grade1), and low grade LCIS, with 2 negative sentinel nodes.  CURRENT THERAPY: Tamoxifen 20 mg since October 2012; anticipate switching to aromatase inhibitor once patient is menopausal (she is currently having periods)  INTERVAL HISTORY: Julie Villa 52 y.o. female returns for routine 6 month follow up with clinical breast exam, labs, and system review of her LEFT breast cancer. She is doing very well overall. She is tolerating her Tamoxifen without difficulty. She denies any concerns. She states occasionally she has some mild vaginal itching but no problems. She does have heavy periods though somewhat irregularly.  MEDICAL HISTORY: Past Medical History  Diagnosis Date  . Allergy sulfa and pcn  . Cancer     breast -lft    SURGICAL HISTORY:  Past Surgical History  Procedure Laterality Date  . Appendectomy  in 2007  . 3 child births qnd  admission for dehydration in apri 2007    . Breast lumpectomy  2012    left  . Nasal sinus surgery      MEDICATIONS: Current Outpatient Prescriptions  Medication Sig Dispense Refill  . budesonide (ENTOCORT EC) 3 MG 24 hr capsule Take 6 mg by mouth every 3 (three) days.       . multivitamin (THERAGRAN) per tablet Take 1 tablet by mouth daily.        . solifenacin (VESICARE) 10 MG tablet Take 5 mg by mouth daily.        . tamoxifen (NOLVADEX) 20 MG tablet TAKE 1 TABLET BY MOUTH EVERY DAY  90 tablet  1   No current facility-administered medications for this visit.    ALLERGIES:  is allergic to penicillins and sulfa antibiotics.  REVIEW OF SYSTEMS: General: denies unexplained weight loss, fatigue, night  sweats, fevers, chills HEENT: denies headaches, blurred vision, dizziness, loss of balance Cardiac: denies chest pain, pressure or palpitations Lungs: denies wheezing, shortness of breath or productive cough Abd: denies nausea, vomiting, constipation, diarrhea, indigestion, blood in stool or urine Extremities: denies numbness, tingling, swelling or pain  The rest of the 14-point review of system was negative.   Filed Vitals:   05/21/12 1014  BP: 105/63  Pulse: 64  Temp: 98.5 F (36.9 C)  Resp: 20   Wt Readings from Last 3 Encounters:  05/21/12 186 lb 4.8 oz (84.505 kg)  11/14/11 180 lb 11.2 oz (81.965 kg)  10/31/11 183 lb 8 oz (83.235 kg)   ECOG Performance status: 0  PHYSICAL EXAMINATION: 52 year old Caucasian female who appears her stated age.  General:  well-nourished in no acute distress.   Eyes:  no scleral icterus.   ENT:  There were no oropharyngeal lesions.  Neck was without thyromegaly.  Lymphatics:  Negative cervical, supraclavicular, axillary, or inguinal adenopathy.  Respiratory: lungs were clear bilaterally without wheezing or crackles.  Cardiovascular:  Regular rate and rhythm, S1/S2, without murmur, rub or gallop.  There was no pedal edema.   Breast: no nipple retraction, no nipple discharge, no unusual masses or thickening, LEFT  Lumpectomy scar noted with no fibrotic tissue and no thickening GI:  abdomen was soft, flat, nontender, nondistended, without organomegaly.  Musculoskeletal:  no spinal  tenderness of palpation of vertebral spine.   Skin exam was without echymosis, petichae.   Neuro exam was nonfocal.  Cranial nerves II- XII grossly intact Patient was able to get on and off exam table without assistance.  Gait was normal.  Patient was alerted and oriented.  Attention was good.   Language was appropriate.  Mood was normal without depression.  Speech was not pressured.  Thought content was not tangential.         LABORATORY/RADIOLOGY DATA:  Lab Results   Component Value Date   WBC 3.8* 05/21/2012   HGB 12.3 05/21/2012   HCT 34.9 05/21/2012   PLT 191 05/21/2012   GLUCOSE 80 11/14/2011   ALT 12 11/14/2011   AST 15 11/14/2011   NA 141 11/14/2011   K 4.2 11/14/2011   CL 105 11/14/2011   CREATININE 0.93 11/14/2011   BUN 26* 11/14/2011   CO2 28 11/14/2011    Mm Digital Diagnostic Bilat  04/22/2012  *RADIOLOGY REPORT*  Clinical Data:  The patient underwent left lumpectomy and radiation therapy for breast cancer in 2012.  DIGITAL DIAGNOSTIC BILATERAL MAMMOGRAM WITH CAD  Comparison: 04/17/2011, 08/22/2010, 08/16/2009  Findings:  ACR Breast Density Category 1: The breast tissue is almost entirely fatty.  Left lumpectomy and radiation therapy changes are present.  There is no suspicious dominant mass, nonsurgical architectural distortion or calcification to suggest malignancy.  Mammographic images were processed with CAD.  IMPRESSION: No mammographic evidence of malignancy.  RECOMMENDATION: Yearly diagnostic mammography is suggested.  I have discussed the findings and recommendations with the patient. Results were also provided in writing at the conclusion of the visit.  BI-RADS CATEGORY 2:  Benign finding(s).   Original Report Authenticated By: Cain Saupe, M.D.        ASSESSMENT AND PLAN:  1.LEFT lumpectomy T1b N0 M0 ER+, PR+  on 09/21/2010 with   -invasive low grade (1) ductal ca spanning 0.6cm ,  -low grade DCIS,   -invasive lobular ca spanning 0.3cm (grade1)  - low grade LCIS, with 2 negative sentinel nodes. Currently on Tamoxifen 20 mg, since October 2012. She is still perimenopausal with heavy periods every 4 to 6 weeks. We will see her back in 6 months. She will follow up with Dr. Welton Flakes. All of her questions were answered to her satisfaction.  Last mammogram  Jan 2014 was unremarkable.    Bobbe Medico, AOCNP, NP-C

## 2012-05-21 NOTE — Telephone Encounter (Signed)
gv pt appt schedule for August.  °

## 2012-08-20 ENCOUNTER — Other Ambulatory Visit: Payer: Self-pay | Admitting: Oncology

## 2012-08-20 DIAGNOSIS — Z853 Personal history of malignant neoplasm of breast: Secondary | ICD-10-CM

## 2012-08-20 DIAGNOSIS — C50912 Malignant neoplasm of unspecified site of left female breast: Secondary | ICD-10-CM

## 2012-09-04 ENCOUNTER — Other Ambulatory Visit: Payer: Self-pay | Admitting: Family Medicine

## 2012-09-04 DIAGNOSIS — M5430 Sciatica, unspecified side: Secondary | ICD-10-CM

## 2012-09-07 ENCOUNTER — Other Ambulatory Visit: Payer: BC Managed Care – PPO

## 2012-09-10 ENCOUNTER — Ambulatory Visit
Admission: RE | Admit: 2012-09-10 | Discharge: 2012-09-10 | Disposition: A | Discharge status: Home / Self Care | Payer: BC Managed Care – PPO | Source: Ambulatory Visit | Attending: Family Medicine | Admitting: Family Medicine

## 2012-09-10 DIAGNOSIS — M5430 Sciatica, unspecified side: Secondary | ICD-10-CM

## 2012-10-16 ENCOUNTER — Encounter (INDEPENDENT_AMBULATORY_CARE_PROVIDER_SITE_OTHER): Payer: Self-pay | Admitting: Surgery

## 2012-10-16 ENCOUNTER — Ambulatory Visit (INDEPENDENT_AMBULATORY_CARE_PROVIDER_SITE_OTHER): Payer: BC Managed Care – PPO | Admitting: Surgery

## 2012-10-16 VITALS — BP 116/68 | HR 66 | Temp 98.8°F | Resp 15 | Ht 66.0 in | Wt 183.2 lb

## 2012-10-16 DIAGNOSIS — Z853 Personal history of malignant neoplasm of breast: Secondary | ICD-10-CM

## 2012-10-16 NOTE — Patient Instructions (Signed)
Continue annual mammograms and follow ups 

## 2012-10-16 NOTE — Progress Notes (Signed)
NAME: Julie Villa       DOB: [birth year]           DATE: 10/16/2012       MRN: [id number]   Julie Villa is a 52 y.o.Marland Kitchenfemale who presents for routine followup of her Left breast cancer, invasive ductal with associated DCIS, receptor positive, stage I, diagnosed in mid 2012 and treated with lumpectomy, SLN and radiation follwed by Tamoxifen. She has no problems or concerns on either side.  PFSH: She has had no significant changes since the last visit here.  ROS: There have been no significant changes since the last visit here  EXAM: General: The patient is alert, oriented, generally healty appearing, NAD. Mood and affect are normal.  Breasts:  the left breast has minimal edema and increased pigmentation from the radiation and is mildly tender. No other problems noted. She has mild nodularity on both sides  Lymphatics: She has no axillary or supraclavicular adenopathy on either side.  Extremities: Full ROM of the surgical side with no lymphedema noted.  Data Reviewed: Mammogram in January: Clinical Data: The patient underwent left lumpectomy and radiation  therapy for breast cancer in 2012.  DIGITAL DIAGNOSTIC BILATERAL MAMMOGRAM WITH CAD  Comparison: 04/17/2011, 08/22/2010, 08/16/2009  Findings:  ACR Breast Density Category 1: The breast tissue is almost entirely  fatty.  Left lumpectomy and radiation therapy changes are present. There  is no suspicious dominant mass, nonsurgical architectural  distortion or calcification to suggest malignancy.  Mammographic images were processed with CAD.  IMPRESSION:  No mammographic evidence of malignancy.  RECOMMENDATION:  Yearly diagnostic mammography is suggested.  I have discussed the findings and recommendations with the patient.  Results were also provided in writing at the conclusion of the  visit.  BI-RADS CATEGORY 2: Benign finding(s).  Original Report Authenticated By: Cain Saupe, M.D.   Impression: Doing well, with no  evidence of recurrent cancer or new cancer  Plan: Will continue to follow up on an annual basis here.

## 2012-11-18 ENCOUNTER — Encounter: Payer: Self-pay | Admitting: Oncology

## 2012-11-18 ENCOUNTER — Telehealth: Payer: Self-pay | Admitting: Oncology

## 2012-11-18 ENCOUNTER — Other Ambulatory Visit (HOSPITAL_BASED_OUTPATIENT_CLINIC_OR_DEPARTMENT_OTHER): Payer: BC Managed Care – PPO | Admitting: Lab

## 2012-11-18 ENCOUNTER — Ambulatory Visit (HOSPITAL_BASED_OUTPATIENT_CLINIC_OR_DEPARTMENT_OTHER): Payer: BC Managed Care – PPO | Admitting: Oncology

## 2012-11-18 VITALS — BP 121/76 | HR 65 | Temp 98.0°F | Resp 18 | Ht 66.0 in | Wt 185.4 lb

## 2012-11-18 DIAGNOSIS — C50919 Malignant neoplasm of unspecified site of unspecified female breast: Secondary | ICD-10-CM

## 2012-11-18 DIAGNOSIS — Z853 Personal history of malignant neoplasm of breast: Secondary | ICD-10-CM

## 2012-11-18 DIAGNOSIS — Z170 Estrogen receptor positive status [ER+]: Secondary | ICD-10-CM

## 2012-11-18 DIAGNOSIS — C50219 Malignant neoplasm of upper-inner quadrant of unspecified female breast: Secondary | ICD-10-CM

## 2012-11-18 LAB — COMPREHENSIVE METABOLIC PANEL (CC13)
ALT: 10 U/L (ref 0–55)
AST: 14 U/L (ref 5–34)
Albumin: 3.4 g/dL — ABNORMAL LOW (ref 3.5–5.0)
BUN: 17.6 mg/dL (ref 7.0–26.0)
Calcium: 9.4 mg/dL (ref 8.4–10.4)
Chloride: 109 mEq/L (ref 98–109)
Potassium: 4.3 mEq/L (ref 3.5–5.1)
Total Protein: 6.5 g/dL (ref 6.4–8.3)

## 2012-11-18 LAB — CBC WITH DIFFERENTIAL/PLATELET
BASO%: 1.2 % (ref 0.0–2.0)
Basophils Absolute: 0.1 10*3/uL (ref 0.0–0.1)
EOS%: 5.8 % (ref 0.0–7.0)
HGB: 12.4 g/dL (ref 11.6–15.9)
MCH: 32.3 pg (ref 25.1–34.0)
RDW: 12.1 % (ref 11.2–14.5)
lymph#: 1.5 10*3/uL (ref 0.9–3.3)

## 2012-11-18 NOTE — Progress Notes (Signed)
[facility] OFFICE PROGRESS NOTE  FRIED, Doris Cheadle, MD  DIAGNOSIS: 52 yo with hx of T1b N0 er/pr+ breast cancer s/p lumpectomy and xrt completed, on tamoxifen since October 2012   PAST THERAPY:  LEFT lumpectomy on 09/21/2010 with invasive low grade (1) ductal ca spanning 0.6cm , low grade DCIS, invasive lobular ca spanning 0.3cm (grade1), and low grade LCIS, with 2 negative sentinel nodes.  CURRENT THERAPY: Tamoxifen 20 mg since October 2012; anticipate switching to aromatase inhibitor once patient is menopausal   INTERVAL HISTORY: Julie Villa 52 y.o. female returns for routine 6 month follow up. Her periods have stopped. She's tolerating tamoxifen well. She has no fevers chills night sweats headaches shortness of breath chest pains palpitations no myalgias and arthralgias. Remainder of the 10 point review of systems is negative  MEDICAL HISTORY: Past Medical History  Diagnosis Date  . Allergy sulfa and pcn  . Cancer     breast -lft    SURGICAL HISTORY:  Past Surgical History  Procedure Laterality Date  . Appendectomy  in 2007  . 3 child births qnd  admission for dehydration in apri 2007    . Breast lumpectomy  2012    left  . Nasal sinus surgery      MEDICATIONS: Current Outpatient Prescriptions  Medication Sig Dispense Refill  . solifenacin (VESICARE) 10 MG tablet Take 5 mg by mouth daily.        . tamoxifen (NOLVADEX) 20 MG tablet TAKE 1 TABLET BY MOUTH EVERY DAY  90 tablet  1   No current facility-administered medications for this visit.    ALLERGIES:  is allergic to penicillins and sulfa antibiotics.  REVIEW OF SYSTEMS: General: denies unexplained weight loss, fatigue, night sweats, fevers, chills HEENT: denies headaches, blurred vision, dizziness, loss of balance Cardiac: denies chest pain, pressure or palpitations Lungs: denies wheezing, shortness of breath or productive cough Abd: denies nausea, vomiting, constipation, diarrhea, indigestion, blood  in stool or urine Extremities: denies numbness, tingling, swelling or pain  The rest of the 14-point review of system was negative.   Filed Vitals:   11/18/12 1005  BP: 121/76  Pulse: 65  Temp: 98 F (36.7 C)  Resp: 18   Wt Readings from Last 3 Encounters:  11/18/12 185 lb 6.4 oz (84.097 kg)  10/16/12 183 lb 3.2 oz (83.099 kg)  05/21/12 186 lb 4.8 oz (84.505 kg)   ECOG Performance status: 0  PHYSICAL EXAMINATION: 53 year old Caucasian female who appears her stated age.  General:  well-nourished in no acute distress.   Eyes:  no scleral icterus.   ENT:  There were no oropharyngeal lesions.  Neck was without thyromegaly.  Lymphatics:  Negative cervical, supraclavicular, axillary, or inguinal adenopathy.  Respiratory: lungs were clear bilaterally without wheezing or crackles.  Cardiovascular:  Regular rate and rhythm, S1/S2, without murmur, rub or gallop.  There was no pedal edema.   Breast: no nipple retraction, no nipple discharge, no unusual masses or thickening, LEFT  Lumpectomy scar noted with no fibrotic tissue and no thickening GI:  abdomen was soft, flat, nontender, nondistended, without organomegaly.  Musculoskeletal:  no spinal tenderness of palpation of vertebral spine.   Skin exam was without echymosis, petichae.   Neuro exam was nonfocal.  Cranial nerves II- XII grossly intact Patient was able to get on and off exam table without assistance.  Gait was normal.  Patient was alerted and oriented.  Attention was good.   Language was appropriate.  Mood  was normal without depression.  Speech was not pressured.  Thought content was not tangential.         LABORATORY/RADIOLOGY DATA:  Lab Results  Component Value Date   WBC 4.7 11/18/2012   HGB 12.4 11/18/2012   HCT 35.9 11/18/2012   PLT 198 11/18/2012   GLUCOSE 85 11/18/2012   ALT 10 11/18/2012   AST 14 11/18/2012   NA 144 11/18/2012   K 4.3 11/18/2012   CL 107 05/21/2012   CREATININE 0.8 11/18/2012   BUN 17.6 11/18/2012    CO2 27 11/18/2012    Mm Digital Diagnostic Bilat  04/22/2012  *RADIOLOGY REPORT*  Clinical Data:  The patient underwent left lumpectomy and radiation therapy for breast cancer in 2012.  DIGITAL DIAGNOSTIC BILATERAL MAMMOGRAM WITH CAD  Comparison: 04/17/2011, 08/22/2010, 08/16/2009  Findings:  ACR Breast Density Category 1: The breast tissue is almost entirely fatty.  Left lumpectomy and radiation therapy changes are present.  There is no suspicious dominant mass, nonsurgical architectural distortion or calcification to suggest malignancy.  Mammographic images were processed with CAD.  IMPRESSION: No mammographic evidence of malignancy.  RECOMMENDATION: Yearly diagnostic mammography is suggested.  I have discussed the findings and recommendations with the patient. Results were also provided in writing at the conclusion of the visit.  BI-RADS CATEGORY 2:  Benign finding(s).   Original Report Authenticated By: Cain Saupe, M.D.        ASSESSMENT AND PLAN:  1.LEFT lumpectomy T1b N0 M0 ER+, PR+  on 09/21/2010 with   -invasive low grade (1) ductal ca spanning 0.6cm ,  -low grade DCIS,   -invasive lobular ca spanning 0.3cm (grade1)  - low grade LCIS, with 2 negative sentinel nodes. Currently on Tamoxifen 20 mg, since October 2012. She is still perimenopausal with heavy periods every 4 to 6 weeks.   We will see her back in 6 months.   Drue Second, MD Medical/Oncology [facility] [phone] (beeper) [phone] (Office)  11/18/2012, 11:36 AM

## 2012-11-18 NOTE — Patient Instructions (Addendum)
Continue taking tamoxifen 20 mg daily.  I have recommended taking calcium with vitamin D twice a day, he can take it in the form of Os-Cal.  I will plan on seeing you back in 6 months time for followup

## 2013-02-12 ENCOUNTER — Other Ambulatory Visit: Payer: Self-pay | Admitting: *Deleted

## 2013-02-12 DIAGNOSIS — Z853 Personal history of malignant neoplasm of breast: Secondary | ICD-10-CM

## 2013-02-12 DIAGNOSIS — C50912 Malignant neoplasm of unspecified site of left female breast: Secondary | ICD-10-CM

## 2013-02-12 MED ORDER — TAMOXIFEN CITRATE 20 MG PO TABS: ORAL_TABLET | ORAL | Status: DC

## 2013-02-13 ENCOUNTER — Other Ambulatory Visit: Payer: Self-pay

## 2013-05-06 ENCOUNTER — Other Ambulatory Visit: Payer: Self-pay | Admitting: Radiation Oncology

## 2013-05-06 DIAGNOSIS — Z9889 Other specified postprocedural states: Secondary | ICD-10-CM

## 2013-05-06 DIAGNOSIS — Z853 Personal history of malignant neoplasm of breast: Secondary | ICD-10-CM

## 2013-05-13 ENCOUNTER — Ambulatory Visit
Admission: RE | Admit: 2013-05-13 | Discharge: 2013-05-13 | Disposition: A | Discharge status: Home / Self Care | Payer: BC Managed Care – PPO | Source: Ambulatory Visit | Attending: Radiation Oncology | Admitting: Radiation Oncology

## 2013-05-13 DIAGNOSIS — Z9889 Other specified postprocedural states: Secondary | ICD-10-CM

## 2013-05-13 DIAGNOSIS — Z853 Personal history of malignant neoplasm of breast: Secondary | ICD-10-CM

## 2013-05-23 ENCOUNTER — Ambulatory Visit (HOSPITAL_BASED_OUTPATIENT_CLINIC_OR_DEPARTMENT_OTHER): Payer: BC Managed Care – PPO | Admitting: Oncology

## 2013-05-23 ENCOUNTER — Encounter: Payer: Self-pay | Admitting: Oncology

## 2013-05-23 ENCOUNTER — Other Ambulatory Visit (HOSPITAL_BASED_OUTPATIENT_CLINIC_OR_DEPARTMENT_OTHER): Payer: BC Managed Care – PPO

## 2013-05-23 VITALS — BP 107/64 | HR 65 | Temp 97.2°F | Resp 18 | Ht 66.0 in | Wt 173.5 lb

## 2013-05-23 DIAGNOSIS — C50219 Malignant neoplasm of upper-inner quadrant of unspecified female breast: Secondary | ICD-10-CM

## 2013-05-23 DIAGNOSIS — Z853 Personal history of malignant neoplasm of breast: Secondary | ICD-10-CM

## 2013-05-23 DIAGNOSIS — Z170 Estrogen receptor positive status [ER+]: Secondary | ICD-10-CM

## 2013-05-23 LAB — COMPREHENSIVE METABOLIC PANEL (CC13)
ALBUMIN: 3.9 g/dL (ref 3.5–5.0)
ALK PHOS: 46 U/L (ref 40–150)
ALT: 12 U/L (ref 0–55)
ANION GAP: 9 meq/L (ref 3–11)
AST: 16 U/L (ref 5–34)
BUN: 16.6 mg/dL (ref 7.0–26.0)
CALCIUM: 9.7 mg/dL (ref 8.4–10.4)
CHLORIDE: 107 meq/L (ref 98–109)
CO2: 27 meq/L (ref 22–29)
Creatinine: 0.9 mg/dL (ref 0.6–1.1)
GLUCOSE: 60 mg/dL — AB (ref 70–140)
POTASSIUM: 3.9 meq/L (ref 3.5–5.1)
SODIUM: 143 meq/L (ref 136–145)
TOTAL PROTEIN: 6.7 g/dL (ref 6.4–8.3)
Total Bilirubin: 0.6 mg/dL (ref 0.20–1.20)

## 2013-05-23 LAB — CBC WITH DIFFERENTIAL/PLATELET
BASO%: 1.3 % (ref 0.0–2.0)
Basophils Absolute: 0.1 10*3/uL (ref 0.0–0.1)
EOS%: 4.7 % (ref 0.0–7.0)
Eosinophils Absolute: 0.2 10*3/uL (ref 0.0–0.5)
HCT: 34.6 % — ABNORMAL LOW (ref 34.8–46.6)
HGB: 11.6 g/dL (ref 11.6–15.9)
LYMPH%: 38.2 % (ref 14.0–49.7)
MCH: 31.8 pg (ref 25.1–34.0)
MCHC: 33.6 g/dL (ref 31.5–36.0)
MCV: 94.6 fL (ref 79.5–101.0)
MONO#: 0.3 10*3/uL (ref 0.1–0.9)
MONO%: 6.9 % (ref 0.0–14.0)
NEUT%: 48.9 % (ref 38.4–76.8)
NEUTROS ABS: 1.9 10*3/uL (ref 1.5–6.5)
PLATELETS: 232 10*3/uL (ref 145–400)
RBC: 3.65 10*6/uL — AB (ref 3.70–5.45)
RDW: 12.6 % (ref 11.2–14.5)
WBC: 3.9 10*3/uL (ref 3.9–10.3)
lymph#: 1.5 10*3/uL (ref 0.9–3.3)

## 2013-05-23 NOTE — Progress Notes (Signed)
[place] OFFICE PROGRESS NOTE  Villa, Julie Graff, MD  DIAGNOSIS: 53 yo with hx of T1b N0 er/pr+ breast cancer s/p lumpectomy and xrt completed, on tamoxifen since October 2012   PAST THERAPY:  LEFT lumpectomy on 09/21/2010 with invasive low grade (1) ductal ca spanning 0.6cm , low grade DCIS, invasive lobular ca spanning 0.3cm (grade1), and low grade LCIS, with 2 negative sentinel nodes.  CURRENT THERAPY: Tamoxifen 20 mg since October 2012; anticipate switching to aromatase inhibitor once patient is menopausal   INTERVAL HISTORY: Julie Villa 53 y.o. female returns for routine 6 month follow up. Her periods have stopped. She's tolerating tamoxifen well. She has no fevers chills night sweats headaches shortness of breath chest pains palpitations no myalgias and arthralgias. Remainder of the 10 point review of systems is negative  MEDICAL HISTORY: Past Medical History  Diagnosis Date  . Allergy sulfa and pcn  . Cancer     breast -lft    SURGICAL HISTORY:  Past Surgical History  Procedure Laterality Date  . Appendectomy  in 2007  . 3 child births qnd  admission for dehydration in apri 2007    . Breast lumpectomy  2012    left  . Nasal sinus surgery      MEDICATIONS: Current Outpatient Prescriptions  Medication Sig Dispense Refill  . Calcium Carbonate-Vitamin D (CALCIUM 600+D3 PO) Take 1 tablet by mouth 2 (two) times daily.      . solifenacin (VESICARE) 10 MG tablet Take 10 mg by mouth daily.       . tamoxifen (NOLVADEX) 20 MG tablet TAKE 1 TABLET BY MOUTH EVERY DAY  90 tablet  3  . fluticasone (FLONASE) 50 MCG/ACT nasal spray        No current facility-administered medications for this visit.    ALLERGIES:  is allergic to penicillins and sulfa antibiotics.  REVIEW OF SYSTEMS: General: denies unexplained weight loss, fatigue, night sweats, fevers, chills HEENT: denies headaches, blurred vision, dizziness, loss of balance Cardiac: denies chest pain, pressure or  palpitations Lungs: denies wheezing, shortness of breath or productive cough Abd: denies nausea, vomiting, constipation, diarrhea, indigestion, blood in stool or urine Extremities: denies numbness, tingling, swelling or pain  The rest of the 14-point review of system was negative.   Filed Vitals:   05/23/13 1204  BP: 107/64  Pulse: 65  Temp: 97.2 F (36.2 C)  Resp: 18   Wt Readings from Last 3 Encounters:  05/23/13 173 lb 8 oz (78.699 kg)  11/18/12 185 lb 6.4 oz (84.097 kg)  10/16/12 183 lb 3.2 oz (83.099 kg)   ECOG Performance status: 0  PHYSICAL EXAMINATION: 53 year old Caucasian female who appears her stated age.  General:  well-nourished in no acute distress.   Eyes:  no scleral icterus.   ENT:  There were no oropharyngeal lesions.  Neck was without thyromegaly.  Lymphatics:  Negative cervical, supraclavicular, axillary, or inguinal adenopathy.  Respiratory: lungs were clear bilaterally without wheezing or crackles.  Cardiovascular:  Regular rate and rhythm, S1/S2, without murmur, rub or gallop.  There was no pedal edema.   Breast: no nipple retraction, no nipple discharge, no unusual masses or thickening, LEFT  Lumpectomy scar noted with no fibrotic tissue and no thickening GI:  abdomen was soft, flat, nontender, nondistended, without organomegaly.  Musculoskeletal:  no spinal tenderness of palpation of vertebral spine.   Skin exam was without echymosis, petichae.   Neuro exam was nonfocal.  Cranial nerves II- XII grossly intact  Patient was able to get on and off exam table without assistance.  Gait was normal.  Patient was alerted and oriented.  Attention was good.   Language was appropriate.  Mood was normal without depression.  Speech was not pressured.  Thought content was not tangential.         LABORATORY/RADIOLOGY DATA:  Lab Results  Component Value Date   WBC 3.9 05/23/2013   HGB 11.6 05/23/2013   HCT 34.6* 05/23/2013   PLT 232 05/23/2013   GLUCOSE 85 11/18/2012    ALT 10 11/18/2012   AST 14 11/18/2012   NA 144 11/18/2012   K 4.3 11/18/2012   CL 107 05/21/2012   CREATININE 0.8 11/18/2012   BUN 17.6 11/18/2012   CO2 27 11/18/2012    Mm Digital Diagnostic Bilat  04/22/2012  *RADIOLOGY REPORT*  Clinical Data:  The patient underwent left lumpectomy and radiation therapy for breast cancer in 2012.  DIGITAL DIAGNOSTIC BILATERAL MAMMOGRAM WITH CAD  Comparison: 04/17/2011, 08/22/2010, 08/16/2009  Findings:  ACR Breast Density Category 1: The breast tissue is almost entirely fatty.  Left lumpectomy and radiation therapy changes are present.  There is no suspicious dominant mass, nonsurgical architectural distortion or calcification to suggest malignancy.  Mammographic images were processed with CAD.  IMPRESSION: No mammographic evidence of malignancy.  RECOMMENDATION: Yearly diagnostic mammography is suggested.  I have discussed the findings and recommendations with the patient. Results were also provided in writing at the conclusion of the visit.  BI-RADS CATEGORY 2:  Benign finding(s).   Original Report Authenticated By: Ulyess Blossom, M.D.        ASSESSMENT AND PLAN:  1.LEFT lumpectomy T1b N0 M0 ER+, PR+  on 09/21/2010 with   -invasive low grade (1) ductal ca spanning 0.6cm ,  -low grade DCIS,   -invasive lobular ca spanning 0.3cm (grade1)  - low grade LCIS, with 2 negative sentinel nodes. Currently on Tamoxifen 20 mg, since October 2012. She is still perimenopausal with heavy periods every 4 to 6 weeks.   We will see her back in 6 months.   Marcy Panning, MD Medical/Oncology [facility] [phone] (beeper) [phone] (Office)  05/23/2013, 12:22 PM

## 2013-09-22 ENCOUNTER — Telehealth: Payer: Self-pay | Admitting: Hematology and Oncology

## 2013-09-22 NOTE — Telephone Encounter (Signed)
, °

## 2013-12-01 ENCOUNTER — Other Ambulatory Visit (HOSPITAL_BASED_OUTPATIENT_CLINIC_OR_DEPARTMENT_OTHER): Payer: BC Managed Care – PPO

## 2013-12-01 ENCOUNTER — Ambulatory Visit (HOSPITAL_BASED_OUTPATIENT_CLINIC_OR_DEPARTMENT_OTHER): Payer: BC Managed Care – PPO | Admitting: Hematology and Oncology

## 2013-12-01 ENCOUNTER — Telehealth: Payer: Self-pay | Admitting: Hematology and Oncology

## 2013-12-01 ENCOUNTER — Encounter: Payer: Self-pay | Admitting: Hematology and Oncology

## 2013-12-01 VITALS — BP 111/67 | HR 57 | Temp 98.1°F | Resp 18 | Ht 66.0 in | Wt 167.1 lb

## 2013-12-01 DIAGNOSIS — C50219 Malignant neoplasm of upper-inner quadrant of unspecified female breast: Secondary | ICD-10-CM

## 2013-12-01 DIAGNOSIS — Z170 Estrogen receptor positive status [ER+]: Secondary | ICD-10-CM

## 2013-12-01 DIAGNOSIS — Z853 Personal history of malignant neoplasm of breast: Secondary | ICD-10-CM

## 2013-12-01 DIAGNOSIS — C50912 Malignant neoplasm of unspecified site of left female breast: Secondary | ICD-10-CM

## 2013-12-01 DIAGNOSIS — C50212 Malignant neoplasm of upper-inner quadrant of left female breast: Secondary | ICD-10-CM | POA: Insufficient documentation

## 2013-12-01 NOTE — Progress Notes (Signed)
Patient Care Team: Abigail Miyamoto, MD as PCP - General (Family Medicine) Thea Silversmith, MD (Radiation Oncology) Eston Esters, MD (Hematology and Oncology)  DIAGNOSIS: Carcinoma of left female breast   Primary site: Breast (Left)   Staging method: AJCC 7th Edition   Pathologic: Stage IA (T1b, N0, cM0) signed by Rulon Eisenmenger, MD on 12/01/2013  4:39 PM   Summary: Stage IA (T1b, N0, cM0)   SUMMARY OF ONCOLOGIC HISTORY:   Carcinoma of left female breast   09/21/2010 Surgery Left breast lumpectomy invasive low grade ductal carcinoma 0.6 cm with low-grade DCIS invasive lobular cancer spent 0.3 cm grade 1; 2 sentinel nodes negative   02/01/2011 -  Anti-estrogen oral therapy Tamoxifen 20 mg by mouth daily    CHIEF COMPLIANT: 6 month followup with history of breast cancer  INTERVAL HISTORY: Mrs. Verne is a 53 year old Caucasian lady with above-mentioned history of invasive ductal and invasive lobular cancer this with low-grade LCIS and DCIS. She is currently on adjuvant hormonal therapy with tamoxifen tolerating it very well she denies any major problems taking it. Does not have any hot flashes or muscle aches or pains. She has not had any vaginal bleeding or spotting.   REVIEW OF SYSTEMS:   Constitutional: Denies fevers, chills or abnormal weight loss Eyes: Denies blurriness of vision Ears, nose, mouth, throat, and face: Denies mucositis or sore throat Respiratory: Denies cough, dyspnea or wheezes Cardiovascular: Denies palpitation, chest discomfort or lower extremity swelling Gastrointestinal:  Denies nausea, heartburn or change in bowel habits Skin: Denies abnormal skin rashes Lymphatics: Denies new lymphadenopathy or easy bruising Neurological:Denies numbness, tingling or new weaknesses Behavioral/Psych: Mood is stable, no new changes  Breast: denies any pain or lumps or nodules in either breasts All other systems were reviewed with the patient and are negative.  I have reviewed the  past medical history, past surgical history, social history and family history with the patient and they are unchanged from previous note.  ALLERGIES:  is allergic to penicillins and sulfa antibiotics.  MEDICATIONS:  Current Outpatient Prescriptions  Medication Sig Dispense Refill  . Calcium Carbonate-Vitamin D (CALCIUM 600+D3 PO) Take 1 tablet by mouth 2 (two) times daily.      . fluticasone (FLONASE) 50 MCG/ACT nasal spray       . magnesium gluconate (MAGONATE) 500 MG tablet Take 500 mg by mouth daily.      . solifenacin (VESICARE) 10 MG tablet Take 10 mg by mouth daily.       . tamoxifen (NOLVADEX) 20 MG tablet TAKE 1 TABLET BY MOUTH EVERY DAY  90 tablet  3   No current facility-administered medications for this visit.    PHYSICAL EXAMINATION: ECOG PERFORMANCE STATUS: 0 - Asymptomatic  Filed Vitals:   12/01/13 1541  BP: 111/67  Pulse: 57  Temp: 98.1 F (36.7 C)  Resp: 18   Filed Weights   12/01/13 1541  Weight: 167 lb 1.6 oz (75.796 kg)    GENERAL:alert, no distress and comfortable SKIN: skin color, texture, turgor are normal, no rashes or significant lesions EYES: normal, Conjunctiva are pink and non-injected, sclera clear OROPHARYNX:no exudate, no erythema and lips, buccal mucosa, and tongue normal  NECK: supple, thyroid normal size, non-tender, without nodularity LYMPH:  no palpable lymphadenopathy in the cervical, axillary or inguinal LUNGS: clear to auscultation and percussion with normal breathing effort HEART: regular rate & rhythm and no murmurs and no lower extremity edema ABDOMEN:abdomen soft, non-tender and normal bowel sounds Musculoskeletal:no cyanosis of digits and  no clubbing  NEURO: alert & oriented x 3 with fluent speech, no focal motor/sensory deficits BREAST:No palpable masses lungs or nodules in either right or left breasts. No palpable axillary supraclavicular or infraclavicular adenopathy no breast tenderness or nipple discharge.   LABORATORY  DATA:  I have reviewed the data as listed No visits with results within 1 Month(s) from this visit. Latest known visit with results is:  Appointment on 05/23/2013  Component Date Value Ref Range Status  . Sodium 05/23/2013 143  136 - 145 mEq/L Final  . Potassium 05/23/2013 3.9  3.5 - 5.1 mEq/L Final  . Chloride 05/23/2013 107  98 - 109 mEq/L Final  . CO2 05/23/2013 27  22 - 29 mEq/L Final  . Glucose 05/23/2013 60* 70 - 140 mg/dl Final  . BUN 05/23/2013 16.6  7.0 - 26.0 mg/dL Final  . Creatinine 05/23/2013 0.9  0.6 - 1.1 mg/dL Final  . Total Bilirubin 05/23/2013 0.60  0.20 - 1.20 mg/dL Final  . Alkaline Phosphatase 05/23/2013 46  40 - 150 U/L Final  . AST 05/23/2013 16  5 - 34 U/L Final  . ALT 05/23/2013 12  0 - 55 U/L Final  . Total Protein 05/23/2013 6.7  6.4 - 8.3 g/dL Final  . Albumin 05/23/2013 3.9  3.5 - 5.0 g/dL Final  . Calcium 05/23/2013 9.7  8.4 - 10.4 mg/dL Final  . Anion Gap 05/23/2013 9  3 - 11 mEq/L Final  . WBC 05/23/2013 3.9  3.9 - 10.3 10e3/uL Final  . NEUT# 05/23/2013 1.9  1.5 - 6.5 10e3/uL Final  . HGB 05/23/2013 11.6  11.6 - 15.9 g/dL Final  . HCT 05/23/2013 34.6* 34.8 - 46.6 % Final  . Platelets 05/23/2013 232  145 - 400 10e3/uL Final  . MCV 05/23/2013 94.6  79.5 - 101.0 fL Final  . MCH 05/23/2013 31.8  25.1 - 34.0 pg Final  . MCHC 05/23/2013 33.6  31.5 - 36.0 g/dL Final  . RBC 05/23/2013 3.65* 3.70 - 5.45 10e6/uL Final  . RDW 05/23/2013 12.6  11.2 - 14.5 % Final  . lymph# 05/23/2013 1.5  0.9 - 3.3 10e3/uL Final  . MONO# 05/23/2013 0.3  0.1 - 0.9 10e3/uL Final  . Eosinophils Absolute 05/23/2013 0.2  0.0 - 0.5 10e3/uL Final  . Basophils Absolute 05/23/2013 0.1  0.0 - 0.1 10e3/uL Final  . NEUT% 05/23/2013 48.9  38.4 - 76.8 % Final  . LYMPH% 05/23/2013 38.2  14.0 - 49.7 % Final  . MONO% 05/23/2013 6.9  0.0 - 14.0 % Final  . EOS% 05/23/2013 4.7  0.0 - 7.0 % Final  . BASO% 05/23/2013 1.3  0.0 - 2.0 % Final    RADIOGRAPHIC STUDIES: I have personally reviewed  the radiology reports and agreed with their findings.  Mammograms done in February 2015 the reviewed there were no abnormalities noted 1 year followup recommended   ASSESSMENT & PLAN:  Hx Breast cancer, Left, UIQ, IDC and ILC with LCIS, Stage I Left breast invasive low grade ductal carcinoma grade 1 spanning 0.6 cm and an invasive lobular cancer spanning 0.3 cm grade 1 with low-grade LCIS and low-grade DCIS 2 sentinel nodes negative T1 B. N0 M0 stage IA ER/PR positive breast cancer status post lumpectomy and radiation. Patient is currently taking adjuvant tamoxifen and tolerating it very well without any major problems. She denies any major problems or concerns today she had mammograms done in February which were normal. Breast exam done today did not reveal any lumps or  nodules. I would like to send for [facility] and estradiol levels within next lab followup solution abdomen and she is menopausal so that we can change her therapy to aromatase inhibitors.  Surveillance: mammograms and breast exams will be done in February 2016. Patient continues to have surveillance Pap smears. Survivorship: Recommended continuing exercise regularly and eating more fruits and vegetables and less red meat.   Orders Placed This Encounter  Procedures  . MM Digital Diagnostic Bilat    Standing Status: Future     Number of Occurrences:      Standing Expiration Date: 12/01/2014    Order Specific Question:  Reason for Exam (SYMPTOM  OR DIAGNOSIS REQUIRED)    Answer:  H/O left breast cancer annual imaging breasts    Order Specific Question:  Is the patient pregnant?    Answer:  No    Order Specific Question:  Preferred imaging location?    Answer:  [facility]  . CBC with Differential    Standing Status: Future     Number of Occurrences:      Standing Expiration Date: 12/01/2014  . Comprehensive metabolic panel (Cmet) - [facility]    Standing Status: Future     Number of Occurrences:      Standing Expiration Date:  12/01/2014   The patient has a good understanding of the overall plan. she agrees with it. She will call with any problems that may develop before her next visit here.  I spent 25 minutes counseling the patient face to face. The total time spent in the appointment was 30 minutes and more than 50% was on counseling and review of test results    Rulon Eisenmenger, MD 12/01/2013 4:44 PM

## 2013-12-01 NOTE — Assessment & Plan Note (Signed)
Left breast invasive low grade ductal carcinoma grade 1 spanning 0.6 cm and an invasive lobular cancer spanning 0.3 cm grade 1 with low-grade LCIS and low-grade DCIS 2 sentinel nodes negative T1 B. N0 M0 stage IA ER/PR positive breast cancer status post lumpectomy and radiation. Patient is currently taking adjuvant tamoxifen and tolerating it very well without any major problems. She denies any major problems or concerns today she had mammograms done in February which were normal. Breast exam done today did not reveal any lumps or nodules.  Surveillance: 10 mammograms and breast exams will be done. Patient continues to have surveillance Pap smears. Survivorship: Recommended continuing exercise regularly and eating more fruits and vegetables and less red meat.

## 2013-12-01 NOTE — Telephone Encounter (Signed)
per pof to sch appt-schpt appt-pt stated will call and sch mamma herself-adv to sch in Feb prior to Feb appt-gave pt copy of sch

## 2013-12-03 ENCOUNTER — Telehealth: Payer: Self-pay | Admitting: *Deleted

## 2013-12-03 NOTE — Telephone Encounter (Signed)
Order faxed to [place] for mammogram, sent to scan

## 2014-01-28 ENCOUNTER — Other Ambulatory Visit: Payer: Self-pay | Admitting: *Deleted

## 2014-01-28 DIAGNOSIS — C50912 Malignant neoplasm of unspecified site of left female breast: Secondary | ICD-10-CM

## 2014-01-28 DIAGNOSIS — Z853 Personal history of malignant neoplasm of breast: Secondary | ICD-10-CM

## 2014-01-28 MED ORDER — TAMOXIFEN CITRATE 20 MG PO TABS: ORAL_TABLET | ORAL | Status: DC

## 2014-05-14 ENCOUNTER — Ambulatory Visit
Admission: RE | Admit: 2014-05-14 | Discharge: 2014-05-14 | Disposition: A | Discharge status: Home / Self Care | Payer: BLUE CROSS/BLUE SHIELD | Source: Ambulatory Visit | Attending: Hematology and Oncology | Admitting: Hematology and Oncology

## 2014-05-14 DIAGNOSIS — Z853 Personal history of malignant neoplasm of breast: Secondary | ICD-10-CM

## 2014-06-05 ENCOUNTER — Ambulatory Visit (HOSPITAL_BASED_OUTPATIENT_CLINIC_OR_DEPARTMENT_OTHER): Payer: BLUE CROSS/BLUE SHIELD | Admitting: Hematology and Oncology

## 2014-06-05 ENCOUNTER — Other Ambulatory Visit (HOSPITAL_BASED_OUTPATIENT_CLINIC_OR_DEPARTMENT_OTHER): Payer: BLUE CROSS/BLUE SHIELD

## 2014-06-05 ENCOUNTER — Telehealth: Payer: Self-pay | Admitting: Hematology and Oncology

## 2014-06-05 DIAGNOSIS — Z853 Personal history of malignant neoplasm of breast: Secondary | ICD-10-CM

## 2014-06-05 DIAGNOSIS — Z79818 Long term (current) use of other agents affecting estrogen receptors and estrogen levels: Secondary | ICD-10-CM

## 2014-06-05 DIAGNOSIS — C50912 Malignant neoplasm of unspecified site of left female breast: Secondary | ICD-10-CM

## 2014-06-05 LAB — CBC WITH DIFFERENTIAL/PLATELET
BASO%: 1 % (ref 0.0–2.0)
BASOS ABS: 0.1 10*3/uL (ref 0.0–0.1)
EOS ABS: 0.3 10*3/uL (ref 0.0–0.5)
EOS%: 6.1 % (ref 0.0–7.0)
HCT: 33.7 % — ABNORMAL LOW (ref 34.8–46.6)
HEMOGLOBIN: 11.3 g/dL — AB (ref 11.6–15.9)
LYMPH%: 31.4 % (ref 14.0–49.7)
MCH: 31.7 pg (ref 25.1–34.0)
MCHC: 33.5 g/dL (ref 31.5–36.0)
MCV: 94.4 fL (ref 79.5–101.0)
MONO#: 0.4 10*3/uL (ref 0.1–0.9)
MONO%: 7.3 % (ref 0.0–14.0)
NEUT%: 54.2 % (ref 38.4–76.8)
NEUTROS ABS: 2.6 10*3/uL (ref 1.5–6.5)
Platelets: 209 10*3/uL (ref 145–400)
RBC: 3.57 10*6/uL — AB (ref 3.70–5.45)
RDW: 12.2 % (ref 11.2–14.5)
WBC: 4.8 10*3/uL (ref 3.9–10.3)
lymph#: 1.5 10*3/uL (ref 0.9–3.3)
nRBC: 0 % (ref 0–0)

## 2014-06-05 LAB — COMPREHENSIVE METABOLIC PANEL (CC13)
ALBUMIN: 3.7 g/dL (ref 3.5–5.0)
ALT: 9 U/L (ref 0–55)
AST: 17 U/L (ref 5–34)
Alkaline Phosphatase: 47 U/L (ref 40–150)
Anion Gap: 7 mEq/L (ref 3–11)
BUN: 20.1 mg/dL (ref 7.0–26.0)
CHLORIDE: 108 meq/L (ref 98–109)
CO2: 26 meq/L (ref 22–29)
Calcium: 9.4 mg/dL (ref 8.4–10.4)
Creatinine: 0.8 mg/dL (ref 0.6–1.1)
EGFR: 79 mL/min/{1.73_m2} — ABNORMAL LOW (ref >90)
GLUCOSE: 88 mg/dL (ref 70–140)
POTASSIUM: 4.4 meq/L (ref 3.5–5.1)
SODIUM: 141 meq/L (ref 136–145)
TOTAL PROTEIN: 6.1 g/dL — AB (ref 6.4–8.3)
Total Bilirubin: 0.66 mg/dL (ref 0.20–1.20)

## 2014-06-05 LAB — FOLLICLE STIMULATING HORMONE: FSH: 42.7 m[IU]/mL

## 2014-06-05 NOTE — Assessment & Plan Note (Signed)
Left breast invasive low grade ductal carcinoma grade 1 spanning 0.6 cm and an invasive lobular cancer spanning 0.3 cm grade 1 with low-grade LCIS and low-grade DCIS 2 sentinel nodes negative T1 B. N0 M0 stage IA ER/PR positive breast cancer status post lumpectomy and radiation. Patient is currently taking adjuvant tamoxifen  Tamoxifen toxicities: No major side effects of tamoxifen Breast cancer surveillance: 1. Breast exam glucoses 2016 is normal 2. Mammogram 05/14/2014 is normal  Anemia: Hemoglobin 11.4. Next time she comes we will repeat CBC along with iron studies I-43 and folic acid. I will call the patient with the results of [place] and estradiol levels to see whether she is menopausal. If she is menopausal we have to consider whether or not to switch her from tamoxifen to anastrozole.  Return to clinic in 6 months.

## 2014-06-05 NOTE — Telephone Encounter (Signed)
per pof to sch pt appt-gave pt copy of sch °

## 2014-06-05 NOTE — Progress Notes (Signed)
Patient Care Team: Abigail Miyamoto, MD as PCP - General (Family Medicine) Thea Silversmith, MD (Radiation Oncology) Eston Esters, MD (Hematology and Oncology)  DIAGNOSIS: Carcinoma of left female breast   Staging form: Breast, AJCC 7th Edition     Clinical: No stage assigned - Unsigned     Pathologic: Stage IA (T1b, N0, cM0) - Signed by Rulon Eisenmenger, MD on 12/01/2013   SUMMARY OF ONCOLOGIC HISTORY:   Carcinoma of left female breast   09/21/2010 Surgery Left breast lumpectomy invasive low grade ductal carcinoma 0.6 cm with low-grade DCIS invasive lobular cancer spent 0.3 cm grade 1; 2 sentinel nodes negative   02/01/2011 -  Anti-estrogen oral therapy Tamoxifen 20 mg by mouth daily    CHIEF COMPLIANT: Six-month follow-up on tamoxifen therapy  INTERVAL HISTORY: Julie Villa is a 54 year old lady with above-mentioned history of left breast cancer treated with lumpectomy followed by tamoxifen 20 mg daily since October 2012. She has been tolerating it fairly well without any major problems or concerns. Denies any lumps or nodules in the breasts.  REVIEW OF SYSTEMS:   Constitutional: Denies fevers, chills or abnormal weight loss Eyes: Denies blurriness of vision Ears, nose, mouth, throat, and face: Denies mucositis or sore throat Respiratory: Denies cough, dyspnea or wheezes Cardiovascular: Denies palpitation, chest discomfort or lower extremity swelling Gastrointestinal:  Denies nausea, heartburn or change in bowel habits Skin: Denies abnormal skin rashes Lymphatics: Denies new lymphadenopathy or easy bruising Neurological:Denies numbness, tingling or new weaknesses Behavioral/Psych: Mood is stable, no new changes  Breast:  denies any pain or lumps or nodules in either breasts All other systems were reviewed with the patient and are negative.  I have reviewed the past medical history, past surgical history, social history and family history with the patient and they are unchanged from  previous note.  ALLERGIES:  is allergic to penicillins and sulfa antibiotics.  MEDICATIONS:  Current Outpatient Prescriptions  Medication Sig Dispense Refill  . Calcium Carbonate-Vitamin D (CALCIUM 600+D3 PO) Take 1 tablet by mouth 2 (two) times daily.    . fluticasone (FLONASE) 50 MCG/ACT nasal spray     . magnesium gluconate (MAGONATE) 500 MG tablet Take 500 mg by mouth daily.    . solifenacin (VESICARE) 10 MG tablet Take 10 mg by mouth daily.     . tamoxifen (NOLVADEX) 20 MG tablet TAKE 1 TABLET BY MOUTH EVERY DAY 90 tablet 1   No current facility-administered medications for this visit.    PHYSICAL EXAMINATION: ECOG PERFORMANCE STATUS: 0 - Asymptomatic  Filed Vitals:   06/05/14 1128  BP: 102/71  Pulse: 78  Temp: 98 F (36.7 C)  Resp: 18   Filed Weights   06/05/14 1128  Weight: 162 lb 12.8 oz (73.846 kg)    GENERAL:alert, no distress and comfortable SKIN: skin color, texture, turgor are normal, no rashes or significant lesions EYES: normal, Conjunctiva are pink and non-injected, sclera clear OROPHARYNX:no exudate, no erythema and lips, buccal mucosa, and tongue normal  NECK: supple, thyroid normal size, non-tender, without nodularity LYMPH:  no palpable lymphadenopathy in the cervical, axillary or inguinal LUNGS: clear to auscultation and percussion with normal breathing effort HEART: regular rate & rhythm and no murmurs and no lower extremity edema ABDOMEN:abdomen soft, non-tender and normal bowel sounds Musculoskeletal:no cyanosis of digits and no clubbing  NEURO: alert & oriented x 3 with fluent speech, no focal motor/sensory deficits BREAST: No palpable masses or nodules in either right or left breasts. No palpable axillary supraclavicular or  infraclavicular adenopathy no breast tenderness or nipple discharge. (exam performed in the presence of a chaperone)  LABORATORY DATA:  I have reviewed the data as listed   Chemistry      Component Value Date/Time   NA  143 05/23/2013 1141   NA 141 11/14/2011 0927   K 3.9 05/23/2013 1141   K 4.2 11/14/2011 0927   CL 107 05/21/2012 0936   CL 105 11/14/2011 0927   CO2 27 05/23/2013 1141   CO2 28 11/14/2011 0927   BUN 16.6 05/23/2013 1141   BUN 26* 11/14/2011 0927   CREATININE 0.9 05/23/2013 1141   CREATININE 0.93 11/14/2011 0927      Component Value Date/Time   CALCIUM 9.7 05/23/2013 1141   CALCIUM 9.5 11/14/2011 0927   ALKPHOS 46 05/23/2013 1141   ALKPHOS 43 11/14/2011 0927   AST 16 05/23/2013 1141   AST 15 11/14/2011 0927   ALT 12 05/23/2013 1141   ALT 12 11/14/2011 0927   BILITOT 0.60 05/23/2013 1141   BILITOT 1.0 11/14/2011 0927       Lab Results  Component Value Date   WBC 4.8 06/05/2014   HGB 11.3* 06/05/2014   HCT 33.7* 06/05/2014   MCV 94.4 06/05/2014   PLT 209 06/05/2014   NEUTROABS 2.6 06/05/2014     RADIOGRAPHIC STUDIES: I have personally reviewed the radiology reports and agreed with their findings. Mammogram February 2016 is normal   ASSESSMENT & PLAN:  Carcinoma of left female breast Left breast invasive low grade ductal carcinoma grade 1 spanning 0.6 cm and an invasive lobular cancer spanning 0.3 cm grade 1 with low-grade LCIS and low-grade DCIS 2 sentinel nodes negative T1 B. N0 M0 stage IA ER/PR positive breast cancer status post lumpectomy and radiation. Patient is currently taking adjuvant tamoxifen  Tamoxifen toxicities: No major side effects of tamoxifen Breast cancer surveillance: 1. Breast exam glucoses 2016 is normal 2. Mammogram 05/14/2014 is normal  Anemia: Hemoglobin 11.4. Next time she comes we will repeat CBC along with iron studies D-74 and folic acid. I will call the patient with the results of [place] and estradiol levels to see whether she is menopausal. If she is menopausal we have to consider whether or not to switch her from tamoxifen to anastrozole.  Return to clinic in 6 months.    Orders Placed This Encounter  Procedures  . CBC with  Differential    Standing Status: Future     Number of Occurrences:      Standing Expiration Date: 06/05/2015  . Comprehensive metabolic panel (Cmet) - [facility]    Standing Status: Future     Number of Occurrences:      Standing Expiration Date: 06/05/2015  . Iron and TIBC [facility]    Standing Status: Future     Number of Occurrences:      Standing Expiration Date: 06/05/2015  . Ferritin    Standing Status: Future     Number of Occurrences:      Standing Expiration Date: 06/05/2015  . Folate, Serum    Standing Status: Future     Number of Occurrences:      Standing Expiration Date: 06/05/2015  . Vitamin B12    Standing Status: Future     Number of Occurrences:      Standing Expiration Date: 06/05/2015   The patient has a good understanding of the overall plan. she agrees with it. She will call with any problems that may develop before her next visit here.  Rulon Eisenmenger, MD

## 2014-06-10 LAB — ESTRADIOL, ULTRA SENS: Estradiol, Ultra Sensitive: <2 pg/mL

## 2014-06-18 ENCOUNTER — Other Ambulatory Visit: Payer: Self-pay | Admitting: Hematology and Oncology

## 2014-06-18 DIAGNOSIS — C50912 Malignant neoplasm of unspecified site of left female breast: Secondary | ICD-10-CM

## 2014-06-18 MED ORDER — ANASTROZOLE 1 MG PO TABS: 1.0000 mg | ORAL_TABLET | Freq: Every day | ORAL | Status: DC

## 2014-09-10 ENCOUNTER — Encounter: Payer: Self-pay | Admitting: Podiatry

## 2014-09-10 ENCOUNTER — Ambulatory Visit (INDEPENDENT_AMBULATORY_CARE_PROVIDER_SITE_OTHER): Payer: BLUE CROSS/BLUE SHIELD | Admitting: Podiatry

## 2014-09-10 VITALS — BP 114/68 | HR 61 | Resp 12

## 2014-09-10 DIAGNOSIS — B351 Tinea unguium: Secondary | ICD-10-CM | POA: Diagnosis not present

## 2014-09-10 DIAGNOSIS — L600 Ingrowing nail: Secondary | ICD-10-CM | POA: Diagnosis not present

## 2014-09-10 NOTE — Progress Notes (Signed)
Subjective:     Patient ID: Julie Villa, female   DOB: Jan 25, 1961, 54 y.o.   MRN: [id number]  HPI patient states I've had trouble with my left second nail for the last year or 2 and now my right second nail has become discolored and is been painful even though the pain has receded recently   Review of Systems  All other systems reviewed and are negative.      Objective:   Physical Exam  Constitutional: She is oriented to person, place, and time.  Cardiovascular: Intact distal pulses.   Musculoskeletal: Normal range of motion.  Neurological: She is oriented to person, place, and time.  Skin: Skin is warm.  Nursing note and vitals reviewed.  neurovascular status intact muscle strength adequate with range of motion subtalar midtarsal joint within normal limits. Patient's noted to have a thickened left hallux nail with damage noted to it and is noted to have on the right second nail discoloration and the indications it we'll also thickened. She has elongated second toes and does have good digital perfusion and is well oriented 3     Assessment:     Damaged second toenail left over right with thickness more due to trauma to the nailbeds versus fungal infiltration    Plan:     H&P and conditions reviewed with patient. I have recommended no treatment currently if she is not and pain and I discussed nail removal if they become painful and if she so chooses we could consider more aggressive anti-fungal therapy with laser and topical even though I am not convinced it would help her in any way. Patient is to be seen back as she decides

## 2014-09-10 NOTE — Progress Notes (Signed)
   Subjective:    Patient ID: Julie Villa, female    DOB: November 14, 1960, 54 y.o.   MRN: [id number]  HPI  BIL 2nd toe, Left nail has had fungus for at least a year, may want to consider laser treatment. Right started to be painful less than a month ago, possibly ingrown.   Review of Systems  Skin: Positive for color change.       Objective:   Physical Exam        Assessment & Plan:

## 2014-12-07 ENCOUNTER — Telehealth: Payer: Self-pay | Admitting: Hematology and Oncology

## 2014-12-07 ENCOUNTER — Encounter: Payer: Self-pay | Admitting: Hematology and Oncology

## 2014-12-07 ENCOUNTER — Ambulatory Visit (HOSPITAL_BASED_OUTPATIENT_CLINIC_OR_DEPARTMENT_OTHER): Payer: BLUE CROSS/BLUE SHIELD | Admitting: Hematology and Oncology

## 2014-12-07 ENCOUNTER — Other Ambulatory Visit (HOSPITAL_BASED_OUTPATIENT_CLINIC_OR_DEPARTMENT_OTHER): Payer: BLUE CROSS/BLUE SHIELD

## 2014-12-07 VITALS — BP 118/67 | HR 57 | Temp 98.1°F | Resp 18 | Ht 66.0 in | Wt 156.8 lb

## 2014-12-07 DIAGNOSIS — C50212 Malignant neoplasm of upper-inner quadrant of left female breast: Secondary | ICD-10-CM | POA: Diagnosis not present

## 2014-12-07 DIAGNOSIS — C50912 Malignant neoplasm of unspecified site of left female breast: Secondary | ICD-10-CM

## 2014-12-07 DIAGNOSIS — D649 Anemia, unspecified: Secondary | ICD-10-CM | POA: Diagnosis not present

## 2014-12-07 DIAGNOSIS — Z79811 Long term (current) use of aromatase inhibitors: Secondary | ICD-10-CM | POA: Diagnosis not present

## 2014-12-07 LAB — CBC WITH DIFFERENTIAL/PLATELET
BASO%: 1.2 % (ref 0.0–2.0)
Basophils Absolute: 0.1 10*3/uL (ref 0.0–0.1)
EOS%: 3.3 % (ref 0.0–7.0)
Eosinophils Absolute: 0.2 10*3/uL (ref 0.0–0.5)
HCT: 38.7 % (ref 34.8–46.6)
HGB: 13.2 g/dL (ref 11.6–15.9)
LYMPH%: 31 % (ref 14.0–49.7)
MCH: 31.9 pg (ref 25.1–34.0)
MCHC: 34 g/dL (ref 31.5–36.0)
MCV: 93.9 fL (ref 79.5–101.0)
MONO#: 0.3 10*3/uL (ref 0.1–0.9)
MONO%: 6.7 % (ref 0.0–14.0)
NEUT%: 57.8 % (ref 38.4–76.8)
NEUTROS ABS: 3 10*3/uL (ref 1.5–6.5)
Platelets: 237 10*3/uL (ref 145–400)
RBC: 4.12 10*6/uL (ref 3.70–5.45)
RDW: 12.4 % (ref 11.2–14.5)
WBC: 5.2 10*3/uL (ref 3.9–10.3)
lymph#: 1.6 10*3/uL (ref 0.9–3.3)

## 2014-12-07 LAB — COMPREHENSIVE METABOLIC PANEL (CC13)
ALT: 12 U/L (ref 0–55)
AST: 16 U/L (ref 5–34)
Albumin: 4 g/dL (ref 3.5–5.0)
Alkaline Phosphatase: 72 U/L (ref 40–150)
Anion Gap: 7 mEq/L (ref 3–11)
BUN: 16.3 mg/dL (ref 7.0–26.0)
CHLORIDE: 107 meq/L (ref 98–109)
CO2: 30 meq/L — AB (ref 22–29)
CREATININE: 0.9 mg/dL (ref 0.6–1.1)
Calcium: 9.8 mg/dL (ref 8.4–10.4)
EGFR: 75 mL/min/{1.73_m2} — ABNORMAL LOW (ref >90)
GLUCOSE: 86 mg/dL (ref 70–140)
Potassium: 4.3 mEq/L (ref 3.5–5.1)
SODIUM: 143 meq/L (ref 136–145)
TOTAL PROTEIN: 6.8 g/dL (ref 6.4–8.3)
Total Bilirubin: 0.94 mg/dL (ref 0.20–1.20)

## 2014-12-07 LAB — IRON AND TIBC CHCC
%SAT: 23 % (ref 21–57)
Iron: 74 ug/dL (ref 41–142)
TIBC: 321 ug/dL (ref 236–444)
UIBC: 247 ug/dL (ref 120–384)

## 2014-12-07 LAB — VITAMIN B12: VITAMIN B 12: 661 pg/mL (ref 211–911)

## 2014-12-07 LAB — FERRITIN CHCC: Ferritin: 22 ng/ml (ref 9–269)

## 2014-12-07 LAB — FOLATE: FOLATE: 11.4 ng/mL

## 2014-12-07 NOTE — Progress Notes (Signed)
Patient Care Team: Briscoe Deutscher, MD as PCP - General (Family Medicine) Thea Silversmith, MD (Radiation Oncology) Eston Esters, MD (Hematology and Oncology)  DIAGNOSIS: Carcinoma of upper-inner quadrant of left female breast   Staging form: Breast, AJCC 7th Edition     Clinical: No stage assigned - Unsigned     Pathologic: Stage IA (T1b, N0, cM0) - Signed by Rulon Eisenmenger, MD on 12/01/2013   SUMMARY OF ONCOLOGIC HISTORY:   Carcinoma of upper-inner quadrant of left female breast   09/21/2010 Surgery Left breast lumpectomy invasive low grade ductal carcinoma 0.6 cm with low-grade DCIS invasive lobular cancer spent 0.3 cm grade 1; 2 sentinel nodes negative   02/01/2011 -  Anti-estrogen oral therapy Tamoxifen 20 mg by mouth daily switched to anastrozole 1 mg daily February 2016 (after she became postmenopausal)    CHIEF COMPLIANT: Follow-up on anastrozole  INTERVAL HISTORY: Julie Villa is a 54 year old with above-mentioned history of left breast cancer currently on anastrozole therapy and she is tolerating it very well without any major problems or concerns. She denies any hot flashes or myalgias. Denies any lumps or nodules in the breasts.  REVIEW OF SYSTEMS:   Constitutional: Denies fevers, chills or abnormal weight loss Eyes: Denies blurriness of vision Ears, nose, mouth, throat, and face: Denies mucositis or sore throat Respiratory: Denies cough, dyspnea or wheezes Cardiovascular: Denies palpitation, chest discomfort or lower extremity swelling Gastrointestinal:  Denies nausea, heartburn or change in bowel habits Skin: Denies abnormal skin rashes Lymphatics: Denies new lymphadenopathy or easy bruising Neurological:Denies numbness, tingling or new weaknesses Behavioral/Psych: Mood is stable, no new changes  Breast:  denies any pain or lumps or nodules in either breasts All other systems were reviewed with the patient and are negative.  I have reviewed the past medical history, past  surgical history, social history and family history with the patient and they are unchanged from previous note.  ALLERGIES:  is allergic to penicillins and sulfa antibiotics.  MEDICATIONS:  Current Outpatient Prescriptions  Medication Sig Dispense Refill  . anastrozole (ARIMIDEX) 1 MG tablet Take 1 tablet (1 mg total) by mouth daily. 90 tablet 3  . Calcium Carbonate-Vitamin D (CALCIUM 600+D3 PO) Take 1 tablet by mouth 2 (two) times daily.    . magnesium gluconate (MAGONATE) 500 MG tablet Take 500 mg by mouth daily.    . solifenacin (VESICARE) 10 MG tablet Take 10 mg by mouth daily.      No current facility-administered medications for this visit.    PHYSICAL EXAMINATION: ECOG PERFORMANCE STATUS: 0 - Asymptomatic  Filed Vitals:   12/07/14 1342  BP: 118/67  Pulse: 57  Temp: 98.1 F (36.7 C)  Resp: 18   Filed Weights   12/07/14 1342  Weight: 156 lb 12.8 oz (71.124 kg)    GENERAL:alert, no distress and comfortable SKIN: skin color, texture, turgor are normal, no rashes or significant lesions EYES: normal, Conjunctiva are pink and non-injected, sclera clear OROPHARYNX:no exudate, no erythema and lips, buccal mucosa, and tongue normal  NECK: supple, thyroid normal size, non-tender, without nodularity LYMPH:  no palpable lymphadenopathy in the cervical, axillary or inguinal LUNGS: clear to auscultation and percussion with normal breathing effort HEART: regular rate & rhythm and no murmurs and no lower extremity edema ABDOMEN:abdomen soft, non-tender and normal bowel sounds Musculoskeletal:no cyanosis of digits and no clubbing  NEURO: alert & oriented x 3 with fluent speech, no focal motor/sensory deficits BREAST: No palpable masses or nodules in either right or left breasts. No  palpable axillary supraclavicular or infraclavicular adenopathy no breast tenderness or nipple discharge. (exam performed in the presence of a chaperone)  LABORATORY DATA:  I have reviewed the data as  listed   Chemistry      Component Value Date/Time   NA 141 06/05/2014 1112   NA 141 11/14/2011 0927   K 4.4 06/05/2014 1112   K 4.2 11/14/2011 0927   CL 107 05/21/2012 0936   CL 105 11/14/2011 0927   CO2 26 06/05/2014 1112   CO2 28 11/14/2011 0927   BUN 20.1 06/05/2014 1112   BUN 26* 11/14/2011 0927   CREATININE 0.8 06/05/2014 1112   CREATININE 0.93 11/14/2011 0927      Component Value Date/Time   CALCIUM 9.4 06/05/2014 1112   CALCIUM 9.5 11/14/2011 0927   ALKPHOS 47 06/05/2014 1112   ALKPHOS 43 11/14/2011 0927   AST 17 06/05/2014 1112   AST 15 11/14/2011 0927   ALT 9 06/05/2014 1112   ALT 12 11/14/2011 0927   BILITOT 0.66 06/05/2014 1112   BILITOT 1.0 11/14/2011 0927       Lab Results  Component Value Date   WBC 5.2 12/07/2014   HGB 13.2 12/07/2014   HCT 38.7 12/07/2014   MCV 93.9 12/07/2014   PLT 237 12/07/2014   NEUTROABS 3.0 12/07/2014   ASSESSMENT & PLAN:  Carcinoma of upper-inner quadrant of left female breast Left breast invasive low grade ductal carcinoma grade 1 spanning 0.6 cm and an invasive lobular cancer spanning 0.3 cm grade 1 with low-grade LCIS and low-grade DCIS 2 sentinel nodes negative T1 B. N0 M0 stage IA ER/PR positive breast cancer status post lumpectomy and radiation. Started tamoxifen 02/01/2011, switched to anastrozole February 2016 (when she became menopausal)  Anastrozole  toxicities: No major side effects ofanastrozole  I discussed with her that the current standard is to use anastrozole for 5 more years however because of her low risk of recurrence for breast cancer, I recommended sending for breast cancer index to determine if she would benefit from extended adjuvant therapy.  Breast cancer surveillance: 1. Breast exam08/29/2016  is normal 2. Mammogram 05/14/2014 is normal  Patient is postmenopausal based on [facility] of 42 and estradiol of less than 2: We switched her from tamoxifen to anastrozole therapy February 2016  Anemia: Hemoglobin  13.2 Iron studies O84 and Folic acid will be obtained today. Results are pending Return to clinic in 6 months for follow-up  No orders of the defined types were placed in this encounter.   The patient has a good understanding of the overall plan. she agrees with it. she will call with any problems that may develop before the next visit here.   Rulon Eisenmenger, MD

## 2014-12-07 NOTE — Assessment & Plan Note (Signed)
Left breast invasive low grade ductal carcinoma grade 1 spanning 0.6 cm and an invasive lobular cancer spanning 0.3 cm grade 1 with low-grade LCIS and low-grade DCIS 2 sentinel nodes negative T1 B. N0 M0 stage IA ER/PR positive breast cancer status post lumpectomy and radiation. Started tamoxifen 02/01/2011, switched to anastrozole February 2016 (when she became menopausal)  Anastrozole  toxicities: No major side effects ofanastrozole  Breast cancer surveillance: 1. Breast exam08/29/2016  is normal 2. Mammogram 05/14/2014 is normal  Patient is postmenopausal based on [facility] of 42 and estradiol of less than 2: We switched her from tamoxifen to anastrozole therapy February 2016  Anemia: Hemoglobin 11.4 Iron studies N17 and Folic acid will be obtained today. Return to clinic in 6 months for follow-up

## 2014-12-07 NOTE — Telephone Encounter (Signed)
Gave avs & calendar for August 2017 °

## 2014-12-11 ENCOUNTER — Encounter: Payer: Self-pay | Admitting: *Deleted

## 2014-12-11 NOTE — Progress Notes (Signed)
Ordered [facility] per Dr. Lindi Adie order.  Faxed requisition to [facility].

## 2014-12-21 ENCOUNTER — Encounter: Payer: Self-pay | Admitting: *Deleted

## 2014-12-21 NOTE — Progress Notes (Signed)
Received report from [facility] that was insufficient tissue and the test was not performed.  Dr. Lindi Adie and patient notified.

## 2014-12-29 ENCOUNTER — Encounter (HOSPITAL_COMMUNITY): Payer: Self-pay

## 2015-04-14 ENCOUNTER — Other Ambulatory Visit: Payer: Self-pay | Admitting: Hematology and Oncology

## 2015-04-14 DIAGNOSIS — Z853 Personal history of malignant neoplasm of breast: Secondary | ICD-10-CM

## 2015-05-04 ENCOUNTER — Telehealth: Payer: Self-pay | Admitting: *Deleted

## 2015-05-04 DIAGNOSIS — C50912 Malignant neoplasm of unspecified site of left female breast: Secondary | ICD-10-CM

## 2015-05-04 MED ORDER — ANASTROZOLE 1 MG PO TABS: 1.0000 mg | ORAL_TABLET | Freq: Every day | ORAL | Status: DC

## 2015-05-04 NOTE — Telephone Encounter (Signed)
Refill fro anastrozole escriibed.

## 2015-05-17 ENCOUNTER — Other Ambulatory Visit: Payer: Self-pay | Admitting: Hematology and Oncology

## 2015-05-17 ENCOUNTER — Ambulatory Visit
Admission: RE | Admit: 2015-05-17 | Discharge: 2015-05-17 | Disposition: A | Discharge status: Home / Self Care | Payer: BLUE CROSS/BLUE SHIELD | Source: Ambulatory Visit | Attending: Hematology and Oncology | Admitting: Hematology and Oncology

## 2015-05-17 DIAGNOSIS — Z853 Personal history of malignant neoplasm of breast: Secondary | ICD-10-CM

## 2015-06-07 ENCOUNTER — Encounter: Payer: Self-pay | Admitting: Hematology and Oncology

## 2015-06-07 NOTE — Progress Notes (Signed)
Pt called inquiring about a genetic testing bill she is receiving.  She sd she was told she would not be billed for this service.  I forwarded her name and concern to Rhys Martini who will get in touch Aaron Edelman to call the pt back with the result.

## 2015-09-10 DIAGNOSIS — M9903 Segmental and somatic dysfunction of lumbar region: Secondary | ICD-10-CM | POA: Diagnosis not present

## 2015-09-10 DIAGNOSIS — M9904 Segmental and somatic dysfunction of sacral region: Secondary | ICD-10-CM | POA: Diagnosis not present

## 2015-09-10 DIAGNOSIS — M5136 Other intervertebral disc degeneration, lumbar region: Secondary | ICD-10-CM | POA: Diagnosis not present

## 2015-09-10 DIAGNOSIS — M9902 Segmental and somatic dysfunction of thoracic region: Secondary | ICD-10-CM | POA: Diagnosis not present

## 2015-09-17 DIAGNOSIS — M9902 Segmental and somatic dysfunction of thoracic region: Secondary | ICD-10-CM | POA: Diagnosis not present

## 2015-09-17 DIAGNOSIS — M9903 Segmental and somatic dysfunction of lumbar region: Secondary | ICD-10-CM | POA: Diagnosis not present

## 2015-09-17 DIAGNOSIS — M5136 Other intervertebral disc degeneration, lumbar region: Secondary | ICD-10-CM | POA: Diagnosis not present

## 2015-09-17 DIAGNOSIS — M9904 Segmental and somatic dysfunction of sacral region: Secondary | ICD-10-CM | POA: Diagnosis not present

## 2015-09-24 DIAGNOSIS — M9902 Segmental and somatic dysfunction of thoracic region: Secondary | ICD-10-CM | POA: Diagnosis not present

## 2015-09-24 DIAGNOSIS — M5136 Other intervertebral disc degeneration, lumbar region: Secondary | ICD-10-CM | POA: Diagnosis not present

## 2015-09-24 DIAGNOSIS — M9904 Segmental and somatic dysfunction of sacral region: Secondary | ICD-10-CM | POA: Diagnosis not present

## 2015-09-24 DIAGNOSIS — M9903 Segmental and somatic dysfunction of lumbar region: Secondary | ICD-10-CM | POA: Diagnosis not present

## 2015-09-27 DIAGNOSIS — H00025 Hordeolum internum left lower eyelid: Secondary | ICD-10-CM | POA: Diagnosis not present

## 2015-10-01 DIAGNOSIS — M9904 Segmental and somatic dysfunction of sacral region: Secondary | ICD-10-CM | POA: Diagnosis not present

## 2015-10-01 DIAGNOSIS — M5136 Other intervertebral disc degeneration, lumbar region: Secondary | ICD-10-CM | POA: Diagnosis not present

## 2015-10-01 DIAGNOSIS — M9902 Segmental and somatic dysfunction of thoracic region: Secondary | ICD-10-CM | POA: Diagnosis not present

## 2015-10-01 DIAGNOSIS — M9903 Segmental and somatic dysfunction of lumbar region: Secondary | ICD-10-CM | POA: Diagnosis not present

## 2015-10-08 DIAGNOSIS — M9902 Segmental and somatic dysfunction of thoracic region: Secondary | ICD-10-CM | POA: Diagnosis not present

## 2015-10-08 DIAGNOSIS — M9903 Segmental and somatic dysfunction of lumbar region: Secondary | ICD-10-CM | POA: Diagnosis not present

## 2015-10-08 DIAGNOSIS — M5136 Other intervertebral disc degeneration, lumbar region: Secondary | ICD-10-CM | POA: Diagnosis not present

## 2015-10-08 DIAGNOSIS — M9904 Segmental and somatic dysfunction of sacral region: Secondary | ICD-10-CM | POA: Diagnosis not present

## 2015-10-13 DIAGNOSIS — H00025 Hordeolum internum left lower eyelid: Secondary | ICD-10-CM | POA: Diagnosis not present

## 2015-10-15 DIAGNOSIS — M9904 Segmental and somatic dysfunction of sacral region: Secondary | ICD-10-CM | POA: Diagnosis not present

## 2015-10-15 DIAGNOSIS — M9902 Segmental and somatic dysfunction of thoracic region: Secondary | ICD-10-CM | POA: Diagnosis not present

## 2015-10-15 DIAGNOSIS — M5136 Other intervertebral disc degeneration, lumbar region: Secondary | ICD-10-CM | POA: Diagnosis not present

## 2015-10-15 DIAGNOSIS — M9903 Segmental and somatic dysfunction of lumbar region: Secondary | ICD-10-CM | POA: Diagnosis not present

## 2015-10-20 DIAGNOSIS — M9903 Segmental and somatic dysfunction of lumbar region: Secondary | ICD-10-CM | POA: Diagnosis not present

## 2015-10-20 DIAGNOSIS — M5136 Other intervertebral disc degeneration, lumbar region: Secondary | ICD-10-CM | POA: Diagnosis not present

## 2015-10-20 DIAGNOSIS — M9904 Segmental and somatic dysfunction of sacral region: Secondary | ICD-10-CM | POA: Diagnosis not present

## 2015-10-20 DIAGNOSIS — M9902 Segmental and somatic dysfunction of thoracic region: Secondary | ICD-10-CM | POA: Diagnosis not present

## 2015-11-03 DIAGNOSIS — M9902 Segmental and somatic dysfunction of thoracic region: Secondary | ICD-10-CM | POA: Diagnosis not present

## 2015-11-03 DIAGNOSIS — M9904 Segmental and somatic dysfunction of sacral region: Secondary | ICD-10-CM | POA: Diagnosis not present

## 2015-11-03 DIAGNOSIS — M9903 Segmental and somatic dysfunction of lumbar region: Secondary | ICD-10-CM | POA: Diagnosis not present

## 2015-11-03 DIAGNOSIS — M5136 Other intervertebral disc degeneration, lumbar region: Secondary | ICD-10-CM | POA: Diagnosis not present

## 2015-11-17 DIAGNOSIS — M9904 Segmental and somatic dysfunction of sacral region: Secondary | ICD-10-CM | POA: Diagnosis not present

## 2015-11-17 DIAGNOSIS — M9902 Segmental and somatic dysfunction of thoracic region: Secondary | ICD-10-CM | POA: Diagnosis not present

## 2015-11-17 DIAGNOSIS — M5136 Other intervertebral disc degeneration, lumbar region: Secondary | ICD-10-CM | POA: Diagnosis not present

## 2015-11-17 DIAGNOSIS — M9903 Segmental and somatic dysfunction of lumbar region: Secondary | ICD-10-CM | POA: Diagnosis not present

## 2015-12-06 ENCOUNTER — Ambulatory Visit (HOSPITAL_BASED_OUTPATIENT_CLINIC_OR_DEPARTMENT_OTHER): Payer: BLUE CROSS/BLUE SHIELD | Admitting: Hematology and Oncology

## 2015-12-06 ENCOUNTER — Telehealth: Payer: Self-pay | Admitting: Hematology and Oncology

## 2015-12-06 ENCOUNTER — Other Ambulatory Visit (HOSPITAL_BASED_OUTPATIENT_CLINIC_OR_DEPARTMENT_OTHER): Payer: BLUE CROSS/BLUE SHIELD

## 2015-12-06 ENCOUNTER — Encounter: Payer: Self-pay | Admitting: Hematology and Oncology

## 2015-12-06 DIAGNOSIS — Z79811 Long term (current) use of aromatase inhibitors: Secondary | ICD-10-CM

## 2015-12-06 DIAGNOSIS — C50212 Malignant neoplasm of upper-inner quadrant of left female breast: Secondary | ICD-10-CM

## 2015-12-06 DIAGNOSIS — Z170 Estrogen receptor positive status [ER+]: Secondary | ICD-10-CM | POA: Diagnosis not present

## 2015-12-06 LAB — CBC WITH DIFFERENTIAL/PLATELET
BASO%: 0.6 % (ref 0.0–2.0)
BASOS ABS: 0 10*3/uL (ref 0.0–0.1)
EOS%: 3.5 % (ref 0.0–7.0)
Eosinophils Absolute: 0.2 10*3/uL (ref 0.0–0.5)
HEMATOCRIT: 35.5 % (ref 34.8–46.6)
HGB: 12.2 g/dL (ref 11.6–15.9)
LYMPH#: 1.6 10*3/uL (ref 0.9–3.3)
LYMPH%: 33.1 % (ref 14.0–49.7)
MCH: 31.9 pg (ref 25.1–34.0)
MCHC: 34.4 g/dL (ref 31.5–36.0)
MCV: 92.9 fL (ref 79.5–101.0)
MONO#: 0.3 10*3/uL (ref 0.1–0.9)
MONO%: 6.2 % (ref 0.0–14.0)
NEUT#: 2.8 10*3/uL (ref 1.5–6.5)
NEUT%: 56.6 % (ref 38.4–76.8)
PLATELETS: 193 10*3/uL (ref 145–400)
RBC: 3.82 10*6/uL (ref 3.70–5.45)
RDW: 11.9 % (ref 11.2–14.5)
WBC: 4.9 10*3/uL (ref 3.9–10.3)

## 2015-12-06 NOTE — Assessment & Plan Note (Signed)
Left breast invasive low grade ductal carcinoma grade 1 spanning 0.6 cm and an invasive lobular cancer spanning 0.3 cm grade 1 with low-grade LCIS and low-grade DCIS 2 sentinel nodes negative T1 B. N0 M0 stage IA ER/PR positive breast cancer status post lumpectomy and radiation. Started tamoxifen 02/01/2011, switched to anastrozole February 2016 (when she became menopausal)  Anastrozole  toxicities: No major side effects ofanastrozole  Breast cancer index: Low risk of recurrence ear 5 year 10, 2.7%; low likelihood of benefit from extended endocrine therapy. Patient will finish 5 years of October 2017. I discussed with her about stopping therapy at that time.  Breast cancer surveillance: 1. Breast exam 12/06/2015  is normal 2. Mammogram 05/17/2015 is normal, breast density category A  Anemia: Resolved  Return to clinic in 1 year for follow-up

## 2015-12-06 NOTE — Telephone Encounter (Signed)
appt made and avs printed °

## 2015-12-06 NOTE — Progress Notes (Signed)
Patient Care Team: Briscoe Deutscher, MD as PCP - General (Family Medicine) Thea Silversmith, MD (Radiation Oncology) Eston Esters, MD (Hematology and Oncology)  DIAGNOSIS: Carcinoma of upper-inner quadrant of left female breast [facility])   Staging form: Breast, AJCC 7th Edition   - Clinical: No stage assigned - Unsigned   - Pathologic: Stage IA (T1b, N0, cM0) - Signed by Rulon Eisenmenger, MD on 12/01/2013  SUMMARY OF ONCOLOGIC HISTORY:   Carcinoma of upper-inner quadrant of left female breast ([place])   09/21/2010 Surgery    Left breast lumpectomy invasive low grade ductal carcinoma 0.6 cm with low-grade DCIS invasive lobular cancer spent 0.3 cm grade 1; 2 sentinel nodes negative      02/01/2011 -  Anti-estrogen oral therapy    Tamoxifen 20 mg by mouth daily switched to anastrozole 1 mg daily February 2016 (after she became postmenopausal)       CHIEF COMPLIANT: Follow-up on anastrozole  INTERVAL HISTORY: Julie Villa is a 55 year old with above-mentioned history left breast cancer treated with lumpectomy followed by radiation and is currently on antiestrogen therapy with anastrozole. She is tolerating anastrozole fairly well. Denies any hot flashes or myalgias.   REVIEW OF SYSTEMS:   Constitutional: Denies fevers, chills or abnormal weight loss Eyes: Denies blurriness of vision Ears, nose, mouth, throat, and face: Denies mucositis or sore throat Respiratory: Denies cough, dyspnea or wheezes Cardiovascular: Denies palpitation, chest discomfort Gastrointestinal:  Denies nausea, heartburn or change in bowel habits Skin: Denies abnormal skin rashes Lymphatics: Denies new lymphadenopathy or easy bruising Neurological:Denies numbness, tingling or new weaknesses Behavioral/Psych: Mood is stable, no new changes  Extremities: No lower extremity edema Breast:  denies any pain or lumps or nodules in either breasts All other systems were reviewed with the patient and are negative.  I have reviewed  the past medical history, past surgical history, social history and family history with the patient and they are unchanged from previous note.  ALLERGIES:  is allergic to penicillins and sulfa antibiotics.  MEDICATIONS:  Current Outpatient Prescriptions  Medication Sig Dispense Refill  . anastrozole (ARIMIDEX) 1 MG tablet Take 1 tablet (1 mg total) by mouth daily. 90 tablet 3  . Calcium Carbonate-Vitamin D (CALCIUM 600+D3 PO) Take 1 tablet by mouth 2 (two) times daily.    . magnesium gluconate (MAGONATE) 500 MG tablet Take 500 mg by mouth daily.    . solifenacin (VESICARE) 10 MG tablet Take 10 mg by mouth daily.      No current facility-administered medications for this visit.     PHYSICAL EXAMINATION: ECOG PERFORMANCE STATUS: 0 - Asymptomatic  Vitals:   12/06/15 1356  BP: 122/69  Pulse: (!) 58  Resp: 18  Temp: 98 F (36.7 C)   Filed Weights   12/06/15 1356  Weight: 152 lb 6.4 oz (69.1 kg)    GENERAL:alert, no distress and comfortable SKIN: skin color, texture, turgor are normal, no rashes or significant lesions EYES: normal, Conjunctiva are pink and non-injected, sclera clear OROPHARYNX:no exudate, no erythema and lips, buccal mucosa, and tongue normal  NECK: supple, thyroid normal size, non-tender, without nodularity LYMPH:  no palpable lymphadenopathy in the cervical, axillary or inguinal LUNGS: clear to auscultation and percussion with normal breathing effort HEART: regular rate & rhythm and no murmurs and no lower extremity edema ABDOMEN:abdomen soft, non-tender and normal bowel sounds MUSCULOSKELETAL:no cyanosis of digits and no clubbing  NEURO: alert & oriented x 3 with fluent speech, no focal motor/sensory deficits EXTREMITIES: No lower extremity edema  BREAST: No palpable masses or nodules in either right or left breasts. No palpable axillary supraclavicular or infraclavicular adenopathy no breast tenderness or nipple discharge. (exam performed in the presence of a  chaperone)  LABORATORY DATA:  I have reviewed the data as listed   Chemistry      Component Value Date/Time   NA 143 12/07/2014 1329   K 4.3 12/07/2014 1329   CL 107 05/21/2012 0936   CO2 30 (H) 12/07/2014 1329   BUN 16.3 12/07/2014 1329   CREATININE 0.9 12/07/2014 1329      Component Value Date/Time   CALCIUM 9.8 12/07/2014 1329   ALKPHOS 72 12/07/2014 1329   AST 16 12/07/2014 1329   ALT 12 12/07/2014 1329   BILITOT 0.94 12/07/2014 1329       Lab Results  Component Value Date   WBC 4.9 12/06/2015   HGB 12.2 12/06/2015   HCT 35.5 12/06/2015   MCV 92.9 12/06/2015   PLT 193 12/06/2015   NEUTROABS 2.8 12/06/2015     ASSESSMENT & PLAN:  Carcinoma of upper-inner quadrant of left female breast Left breast invasive low grade ductal carcinoma grade 1 spanning 0.6 cm and an invasive lobular cancer spanning 0.3 cm grade 1 with low-grade LCIS and low-grade DCIS 2 sentinel nodes negative T1 B. N0 M0 stage IA ER/PR positive breast cancer status post lumpectomy and radiation. Started tamoxifen 02/01/2011, switched to anastrozole February 2016 (when she became menopausal)  Anastrozole  toxicities: No major side effects ofanastrozole  Breast cancer index: Low risk of recurrence ear 5 year 10, 2.7%; low likelihood of benefit from extended endocrine therapy. Patient will finish 5 years of October 2017. I discussed with her about stopping therapy at that time.  Breast cancer surveillance: 1. Breast exam 12/06/2015  is normal 2. Mammogram 05/17/2015 is normal, breast density category A  Anemia: Resolved  Return to clinic in 1 year for follow-up with survivorship   No orders of the defined types were placed in this encounter.  The patient has a good understanding of the overall plan. she agrees with it. she will call with any problems that may develop before the next visit here.   Rulon Eisenmenger, MD 12/06/15

## 2015-12-08 DIAGNOSIS — M9903 Segmental and somatic dysfunction of lumbar region: Secondary | ICD-10-CM | POA: Diagnosis not present

## 2015-12-08 DIAGNOSIS — M5136 Other intervertebral disc degeneration, lumbar region: Secondary | ICD-10-CM | POA: Diagnosis not present

## 2015-12-08 DIAGNOSIS — M9902 Segmental and somatic dysfunction of thoracic region: Secondary | ICD-10-CM | POA: Diagnosis not present

## 2015-12-08 DIAGNOSIS — M9904 Segmental and somatic dysfunction of sacral region: Secondary | ICD-10-CM | POA: Diagnosis not present

## 2016-01-05 DIAGNOSIS — M9904 Segmental and somatic dysfunction of sacral region: Secondary | ICD-10-CM | POA: Diagnosis not present

## 2016-01-05 DIAGNOSIS — M5136 Other intervertebral disc degeneration, lumbar region: Secondary | ICD-10-CM | POA: Diagnosis not present

## 2016-01-05 DIAGNOSIS — M9902 Segmental and somatic dysfunction of thoracic region: Secondary | ICD-10-CM | POA: Diagnosis not present

## 2016-01-05 DIAGNOSIS — M9903 Segmental and somatic dysfunction of lumbar region: Secondary | ICD-10-CM | POA: Diagnosis not present

## 2016-03-06 ENCOUNTER — Telehealth: Payer: Self-pay

## 2016-03-06 NOTE — Telephone Encounter (Signed)
Received call from pt regarding an insurance letter and bill she received for $5400 for a breast cancer index test done back in 12/2014. Pt states that she thought this would be covered by insurance, or she would not have agreed to proceed with doing this test. Pt states that she would like for someone to contact her insurance company to get this situation resolved and covered with proper documentation. Told pt that will forward this to one of nurse navigators to provide assistance in getting this resolved for pt. Pt appreciative and will await for call tomorrow. Bary Castilla, nurse navigator, made aware and will contact pt with more information.

## 2016-04-24 ENCOUNTER — Other Ambulatory Visit: Payer: Self-pay | Admitting: Hematology and Oncology

## 2016-04-24 DIAGNOSIS — Z853 Personal history of malignant neoplasm of breast: Secondary | ICD-10-CM

## 2016-04-24 DIAGNOSIS — M545 Low back pain: Secondary | ICD-10-CM | POA: Diagnosis not present

## 2016-04-28 DIAGNOSIS — M5136 Other intervertebral disc degeneration, lumbar region: Secondary | ICD-10-CM | POA: Diagnosis not present

## 2016-04-28 DIAGNOSIS — M9902 Segmental and somatic dysfunction of thoracic region: Secondary | ICD-10-CM | POA: Diagnosis not present

## 2016-04-28 DIAGNOSIS — M9904 Segmental and somatic dysfunction of sacral region: Secondary | ICD-10-CM | POA: Diagnosis not present

## 2016-04-28 DIAGNOSIS — M545 Low back pain: Secondary | ICD-10-CM | POA: Diagnosis not present

## 2016-04-28 DIAGNOSIS — N3001 Acute cystitis with hematuria: Secondary | ICD-10-CM | POA: Diagnosis not present

## 2016-04-28 DIAGNOSIS — M9903 Segmental and somatic dysfunction of lumbar region: Secondary | ICD-10-CM | POA: Diagnosis not present

## 2016-05-01 DIAGNOSIS — M545 Low back pain: Secondary | ICD-10-CM | POA: Diagnosis not present

## 2016-05-03 DIAGNOSIS — M545 Low back pain: Secondary | ICD-10-CM | POA: Diagnosis not present

## 2016-05-08 DIAGNOSIS — M545 Low back pain: Secondary | ICD-10-CM | POA: Diagnosis not present

## 2016-05-11 DIAGNOSIS — M545 Low back pain: Secondary | ICD-10-CM | POA: Diagnosis not present

## 2016-05-12 DIAGNOSIS — R319 Hematuria, unspecified: Secondary | ICD-10-CM | POA: Diagnosis not present

## 2016-05-12 DIAGNOSIS — M7060 Trochanteric bursitis, unspecified hip: Secondary | ICD-10-CM | POA: Diagnosis not present

## 2016-05-15 DIAGNOSIS — M545 Low back pain: Secondary | ICD-10-CM | POA: Diagnosis not present

## 2016-05-16 DIAGNOSIS — H2511 Age-related nuclear cataract, right eye: Secondary | ICD-10-CM | POA: Diagnosis not present

## 2016-05-16 DIAGNOSIS — H02839 Dermatochalasis of unspecified eye, unspecified eyelid: Secondary | ICD-10-CM | POA: Diagnosis not present

## 2016-05-16 DIAGNOSIS — H2513 Age-related nuclear cataract, bilateral: Secondary | ICD-10-CM | POA: Diagnosis not present

## 2016-05-16 DIAGNOSIS — H18413 Arcus senilis, bilateral: Secondary | ICD-10-CM | POA: Diagnosis not present

## 2016-05-16 DIAGNOSIS — H40003 Preglaucoma, unspecified, bilateral: Secondary | ICD-10-CM | POA: Diagnosis not present

## 2016-05-18 DIAGNOSIS — M545 Low back pain: Secondary | ICD-10-CM | POA: Diagnosis not present

## 2016-05-22 ENCOUNTER — Ambulatory Visit
Admission: RE | Admit: 2016-05-22 | Discharge: 2016-05-22 | Disposition: A | Discharge status: Home / Self Care | Payer: BLUE CROSS/BLUE SHIELD | Source: Ambulatory Visit | Attending: Hematology and Oncology | Admitting: Hematology and Oncology

## 2016-05-22 DIAGNOSIS — M545 Low back pain: Secondary | ICD-10-CM | POA: Diagnosis not present

## 2016-05-22 DIAGNOSIS — R928 Other abnormal and inconclusive findings on diagnostic imaging of breast: Secondary | ICD-10-CM | POA: Diagnosis not present

## 2016-05-22 DIAGNOSIS — Z853 Personal history of malignant neoplasm of breast: Secondary | ICD-10-CM

## 2016-05-24 DIAGNOSIS — H9201 Otalgia, right ear: Secondary | ICD-10-CM | POA: Diagnosis not present

## 2016-05-24 DIAGNOSIS — R0981 Nasal congestion: Secondary | ICD-10-CM | POA: Diagnosis not present

## 2016-05-25 DIAGNOSIS — M9903 Segmental and somatic dysfunction of lumbar region: Secondary | ICD-10-CM | POA: Diagnosis not present

## 2016-05-25 DIAGNOSIS — M545 Low back pain: Secondary | ICD-10-CM | POA: Diagnosis not present

## 2016-05-25 DIAGNOSIS — M5136 Other intervertebral disc degeneration, lumbar region: Secondary | ICD-10-CM | POA: Diagnosis not present

## 2016-05-25 DIAGNOSIS — M9902 Segmental and somatic dysfunction of thoracic region: Secondary | ICD-10-CM | POA: Diagnosis not present

## 2016-05-25 DIAGNOSIS — M9904 Segmental and somatic dysfunction of sacral region: Secondary | ICD-10-CM | POA: Diagnosis not present

## 2016-05-29 DIAGNOSIS — M545 Low back pain: Secondary | ICD-10-CM | POA: Diagnosis not present

## 2016-05-31 DIAGNOSIS — M545 Low back pain: Secondary | ICD-10-CM | POA: Diagnosis not present

## 2016-06-05 DIAGNOSIS — M545 Low back pain: Secondary | ICD-10-CM | POA: Diagnosis not present

## 2016-06-07 DIAGNOSIS — M545 Low back pain: Secondary | ICD-10-CM | POA: Diagnosis not present

## 2016-06-08 HISTORY — PX: CATARACT EXTRACTION W/ INTRAOCULAR LENS IMPLANT: SHX1309

## 2016-06-12 DIAGNOSIS — M545 Low back pain: Secondary | ICD-10-CM | POA: Diagnosis not present

## 2016-06-14 DIAGNOSIS — M545 Low back pain: Secondary | ICD-10-CM | POA: Diagnosis not present

## 2016-06-14 DIAGNOSIS — N390 Urinary tract infection, site not specified: Secondary | ICD-10-CM | POA: Diagnosis not present

## 2016-06-20 DIAGNOSIS — M545 Low back pain: Secondary | ICD-10-CM | POA: Diagnosis not present

## 2016-06-22 DIAGNOSIS — M545 Low back pain: Secondary | ICD-10-CM | POA: Diagnosis not present

## 2016-06-22 DIAGNOSIS — M9902 Segmental and somatic dysfunction of thoracic region: Secondary | ICD-10-CM | POA: Diagnosis not present

## 2016-06-22 DIAGNOSIS — M9903 Segmental and somatic dysfunction of lumbar region: Secondary | ICD-10-CM | POA: Diagnosis not present

## 2016-06-22 DIAGNOSIS — M5136 Other intervertebral disc degeneration, lumbar region: Secondary | ICD-10-CM | POA: Diagnosis not present

## 2016-06-22 DIAGNOSIS — M9904 Segmental and somatic dysfunction of sacral region: Secondary | ICD-10-CM | POA: Diagnosis not present

## 2016-06-26 DIAGNOSIS — H25041 Posterior subcapsular polar age-related cataract, right eye: Secondary | ICD-10-CM | POA: Diagnosis not present

## 2016-06-26 DIAGNOSIS — H2511 Age-related nuclear cataract, right eye: Secondary | ICD-10-CM | POA: Diagnosis not present

## 2016-06-27 DIAGNOSIS — R350 Frequency of micturition: Secondary | ICD-10-CM | POA: Diagnosis not present

## 2016-06-27 DIAGNOSIS — N390 Urinary tract infection, site not specified: Secondary | ICD-10-CM | POA: Diagnosis not present

## 2016-07-20 DIAGNOSIS — N390 Urinary tract infection, site not specified: Secondary | ICD-10-CM | POA: Diagnosis not present

## 2016-07-20 DIAGNOSIS — R350 Frequency of micturition: Secondary | ICD-10-CM | POA: Diagnosis not present

## 2016-07-20 DIAGNOSIS — M9904 Segmental and somatic dysfunction of sacral region: Secondary | ICD-10-CM | POA: Diagnosis not present

## 2016-07-20 DIAGNOSIS — M9903 Segmental and somatic dysfunction of lumbar region: Secondary | ICD-10-CM | POA: Diagnosis not present

## 2016-07-20 DIAGNOSIS — M5136 Other intervertebral disc degeneration, lumbar region: Secondary | ICD-10-CM | POA: Diagnosis not present

## 2016-07-20 DIAGNOSIS — M9902 Segmental and somatic dysfunction of thoracic region: Secondary | ICD-10-CM | POA: Diagnosis not present

## 2016-07-31 DIAGNOSIS — R300 Dysuria: Secondary | ICD-10-CM | POA: Diagnosis not present

## 2016-08-05 DIAGNOSIS — B373 Candidiasis of vulva and vagina: Secondary | ICD-10-CM | POA: Diagnosis not present

## 2016-08-17 DIAGNOSIS — M5136 Other intervertebral disc degeneration, lumbar region: Secondary | ICD-10-CM | POA: Diagnosis not present

## 2016-08-17 DIAGNOSIS — M9902 Segmental and somatic dysfunction of thoracic region: Secondary | ICD-10-CM | POA: Diagnosis not present

## 2016-08-17 DIAGNOSIS — M9904 Segmental and somatic dysfunction of sacral region: Secondary | ICD-10-CM | POA: Diagnosis not present

## 2016-08-17 DIAGNOSIS — M9903 Segmental and somatic dysfunction of lumbar region: Secondary | ICD-10-CM | POA: Diagnosis not present

## 2016-09-08 DIAGNOSIS — R350 Frequency of micturition: Secondary | ICD-10-CM | POA: Diagnosis not present

## 2016-09-08 DIAGNOSIS — Z0000 Encounter for general adult medical examination without abnormal findings: Secondary | ICD-10-CM | POA: Diagnosis not present

## 2016-09-08 DIAGNOSIS — N3941 Urge incontinence: Secondary | ICD-10-CM | POA: Diagnosis not present

## 2016-09-08 DIAGNOSIS — Z136 Encounter for screening for cardiovascular disorders: Secondary | ICD-10-CM | POA: Diagnosis not present

## 2016-09-08 DIAGNOSIS — N3000 Acute cystitis without hematuria: Secondary | ICD-10-CM | POA: Diagnosis not present

## 2016-09-08 DIAGNOSIS — Z131 Encounter for screening for diabetes mellitus: Secondary | ICD-10-CM | POA: Diagnosis not present

## 2016-09-08 DIAGNOSIS — N952 Postmenopausal atrophic vaginitis: Secondary | ICD-10-CM | POA: Diagnosis not present

## 2016-09-14 DIAGNOSIS — M5136 Other intervertebral disc degeneration, lumbar region: Secondary | ICD-10-CM | POA: Diagnosis not present

## 2016-09-14 DIAGNOSIS — M9902 Segmental and somatic dysfunction of thoracic region: Secondary | ICD-10-CM | POA: Diagnosis not present

## 2016-09-14 DIAGNOSIS — M9903 Segmental and somatic dysfunction of lumbar region: Secondary | ICD-10-CM | POA: Diagnosis not present

## 2016-09-14 DIAGNOSIS — M9904 Segmental and somatic dysfunction of sacral region: Secondary | ICD-10-CM | POA: Diagnosis not present

## 2016-10-02 DIAGNOSIS — N898 Other specified noninflammatory disorders of vagina: Secondary | ICD-10-CM | POA: Diagnosis not present

## 2016-10-02 DIAGNOSIS — R300 Dysuria: Secondary | ICD-10-CM | POA: Diagnosis not present

## 2016-10-17 DIAGNOSIS — M545 Low back pain: Secondary | ICD-10-CM | POA: Diagnosis not present

## 2016-10-17 DIAGNOSIS — M25552 Pain in left hip: Secondary | ICD-10-CM | POA: Diagnosis not present

## 2016-10-17 DIAGNOSIS — M25551 Pain in right hip: Secondary | ICD-10-CM | POA: Diagnosis not present

## 2016-10-17 DIAGNOSIS — M5136 Other intervertebral disc degeneration, lumbar region: Secondary | ICD-10-CM | POA: Diagnosis not present

## 2016-10-17 DIAGNOSIS — G8929 Other chronic pain: Secondary | ICD-10-CM | POA: Diagnosis not present

## 2016-10-18 DIAGNOSIS — M9903 Segmental and somatic dysfunction of lumbar region: Secondary | ICD-10-CM | POA: Diagnosis not present

## 2016-10-18 DIAGNOSIS — M9902 Segmental and somatic dysfunction of thoracic region: Secondary | ICD-10-CM | POA: Diagnosis not present

## 2016-10-18 DIAGNOSIS — M5136 Other intervertebral disc degeneration, lumbar region: Secondary | ICD-10-CM | POA: Diagnosis not present

## 2016-10-18 DIAGNOSIS — M9904 Segmental and somatic dysfunction of sacral region: Secondary | ICD-10-CM | POA: Diagnosis not present

## 2016-10-19 DIAGNOSIS — R350 Frequency of micturition: Secondary | ICD-10-CM | POA: Diagnosis not present

## 2016-10-19 DIAGNOSIS — R8271 Bacteriuria: Secondary | ICD-10-CM | POA: Diagnosis not present

## 2016-10-19 DIAGNOSIS — N390 Urinary tract infection, site not specified: Secondary | ICD-10-CM | POA: Diagnosis not present

## 2016-10-19 DIAGNOSIS — N3946 Mixed incontinence: Secondary | ICD-10-CM | POA: Diagnosis not present

## 2016-11-07 DIAGNOSIS — R350 Frequency of micturition: Secondary | ICD-10-CM | POA: Diagnosis not present

## 2016-11-07 DIAGNOSIS — N390 Urinary tract infection, site not specified: Secondary | ICD-10-CM | POA: Diagnosis not present

## 2016-11-08 DIAGNOSIS — M25551 Pain in right hip: Secondary | ICD-10-CM | POA: Diagnosis not present

## 2016-11-08 DIAGNOSIS — M25552 Pain in left hip: Secondary | ICD-10-CM | POA: Diagnosis not present

## 2016-11-08 DIAGNOSIS — G8929 Other chronic pain: Secondary | ICD-10-CM | POA: Diagnosis not present

## 2016-11-08 DIAGNOSIS — M545 Low back pain: Secondary | ICD-10-CM | POA: Diagnosis not present

## 2016-11-15 DIAGNOSIS — M9904 Segmental and somatic dysfunction of sacral region: Secondary | ICD-10-CM | POA: Diagnosis not present

## 2016-11-15 DIAGNOSIS — M9902 Segmental and somatic dysfunction of thoracic region: Secondary | ICD-10-CM | POA: Diagnosis not present

## 2016-11-15 DIAGNOSIS — M5136 Other intervertebral disc degeneration, lumbar region: Secondary | ICD-10-CM | POA: Diagnosis not present

## 2016-11-15 DIAGNOSIS — M9903 Segmental and somatic dysfunction of lumbar region: Secondary | ICD-10-CM | POA: Diagnosis not present

## 2016-11-27 DIAGNOSIS — M25552 Pain in left hip: Secondary | ICD-10-CM | POA: Diagnosis not present

## 2016-11-27 DIAGNOSIS — M25551 Pain in right hip: Secondary | ICD-10-CM | POA: Diagnosis not present

## 2016-12-01 DIAGNOSIS — M545 Low back pain: Secondary | ICD-10-CM | POA: Diagnosis not present

## 2016-12-01 DIAGNOSIS — M25552 Pain in left hip: Secondary | ICD-10-CM | POA: Diagnosis not present

## 2016-12-01 DIAGNOSIS — M5136 Other intervertebral disc degeneration, lumbar region: Secondary | ICD-10-CM | POA: Diagnosis not present

## 2016-12-01 DIAGNOSIS — M25551 Pain in right hip: Secondary | ICD-10-CM | POA: Diagnosis not present

## 2016-12-04 ENCOUNTER — Encounter: Payer: Self-pay | Admitting: Adult Health

## 2016-12-04 ENCOUNTER — Telehealth: Payer: Self-pay

## 2016-12-04 ENCOUNTER — Encounter: Payer: BLUE CROSS/BLUE SHIELD | Admitting: Adult Health

## 2016-12-04 ENCOUNTER — Ambulatory Visit (HOSPITAL_BASED_OUTPATIENT_CLINIC_OR_DEPARTMENT_OTHER): Payer: BLUE CROSS/BLUE SHIELD | Admitting: Adult Health

## 2016-12-04 VITALS — BP 116/83 | HR 75 | Temp 97.7°F | Resp 20 | Ht 66.0 in | Wt 155.2 lb

## 2016-12-04 DIAGNOSIS — E2839 Other primary ovarian failure: Secondary | ICD-10-CM

## 2016-12-04 DIAGNOSIS — Z853 Personal history of malignant neoplasm of breast: Secondary | ICD-10-CM | POA: Diagnosis not present

## 2016-12-04 DIAGNOSIS — C50212 Malignant neoplasm of upper-inner quadrant of left female breast: Secondary | ICD-10-CM

## 2016-12-04 DIAGNOSIS — Z170 Estrogen receptor positive status [ER+]: Secondary | ICD-10-CM

## 2016-12-04 NOTE — Telephone Encounter (Signed)
Called and left a message with her one year appt and will fax her dexa order to the breast center and they will call her  Tristina Sahagian

## 2016-12-04 NOTE — Progress Notes (Signed)
CLINIC:  Survivorship   REASON FOR VISIT:  Routine follow-up for history of breast cancer.   BRIEF ONCOLOGIC HISTORY:    Carcinoma of upper-inner quadrant of left female breast ([place])   09/21/2010 Surgery    Left breast lumpectomy invasive low grade ductal carcinoma 0.6 cm with low-grade DCIS invasive lobular cancer spent 0.3 cm grade 1; 2 sentinel nodes negative      11/09/2010 - 01/08/2011 Radiation Therapy    Adjuvant radiation (dates were estimated by the patient)      02/01/2011 -  Anti-estrogen oral therapy    Tamoxifen 20 mg by mouth daily switched to anastrozole 1 mg daily February 2016 (after she became postmenopausal)        INTERVAL HISTORY:  Ms. Kasinger presents to the [facility] today for routine follow-up for her history of breast cancer.  Overall, she reports feeling quite well. She stopped taking Anastrozole in October, of 2017.  She is doing well since coming off of the medication.  She is going to see a urologist this week due to vaginal dryness.  She has been on premarin cream, and she is using this twice a week.  Due to her vaginal dryness she has suffered with monthly UTIs.      REVIEW OF SYSTEMS:  Review of Systems  Constitutional: Negative for appetite change, chills, fatigue, fever and unexpected weight change.  HENT:   Negative for hearing loss and lump/mass.   Eyes: Negative for eye problems and icterus.  Respiratory: Negative for chest tightness, cough and shortness of breath.   Cardiovascular: Negative for chest pain, leg swelling and palpitations.  Gastrointestinal: Negative for abdominal distention, abdominal pain, constipation, diarrhea, nausea and vomiting.  Endocrine: Negative for hot flashes.  Genitourinary: Negative for difficulty urinating.   Skin: Negative for itching and rash.  Neurological: Negative for dizziness, extremity weakness, headaches and numbness.  Hematological: Negative for adenopathy. Does not bruise/bleed easily.    Psychiatric/Behavioral: Negative for depression. The patient is not nervous/anxious.   Breast: Denies any new nodularity, masses, tenderness, nipple changes, or nipple discharge.       PAST MEDICAL/SURGICAL HISTORY:  Past Medical History:  Diagnosis Date  . Allergy sulfa and pcn  . Cancer ([place])    breast -lft   Past Surgical History:  Procedure Laterality Date  . 3 child births qnd  admission for dehydration in apri 2007    . APPENDECTOMY  in 2007  . BREAST LUMPECTOMY  2012   left  . NASAL SINUS SURGERY       ALLERGIES:  Allergies  Allergen Reactions  . Penicillins Rash  . Sulfa Antibiotics Rash     CURRENT MEDICATIONS:  Outpatient Encounter Prescriptions as of 12/04/2016  Medication Sig  . Calcium Carbonate-Vitamin D (CALCIUM 600+D3 PO) Take 1 tablet by mouth 2 (two) times daily.  . solifenacin (VESICARE) 10 MG tablet Take 10 mg by mouth daily.   . vitamin C (ASCORBIC ACID) 500 MG tablet Take 500 mg by mouth 2 (two) times daily.  . [DISCONTINUED] magnesium gluconate (MAGONATE) 500 MG tablet Take 500 mg by mouth daily.   No facility-administered encounter medications on file as of 12/04/2016.      ONCOLOGIC FAMILY HISTORY:  Family History  Problem Relation Age of Onset  . Cancer Mother        breast  . Hypertension Mother   . Hyperlipidemia Mother   . Diabetes Father   . Heart disease Father   . Hyperlipidemia Father   .  Hypertension Father   . Diabetes Sister   . Hypertension Sister   . Hyperlipidemia Sister     GENETIC COUNSELING/TESTING: Unsure if she was tested or not  SOCIAL HISTORY:  Daquisha Clermont is married and lives with her husband in [place], [place].  She has 3 children, and they live in [place], [place], and boarding school.  Ms. 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  She denies any current or history of tobacco, alcohol, or illicit drug use.     PHYSICAL EXAMINATION:  Vital Signs: Vitals:   12/04/16 1314   BP: 116/83  Pulse: 75  Resp: 20  Temp: 97.7 F (36.5 C)  SpO2: 100%   Filed Weights   12/04/16 1314  Weight: 155 lb 3.2 oz (70.4 kg)   General: Well-nourished, well-appearing female in no acute distress.  Unaccompanied today.   HEENT: Head is normocephalic.  Pupils equal and reactive to light. Conjunctivae clear without exudate.  Sclerae anicteric. Oral mucosa is pink, moist.  Oropharynx is pink without lesions or erythema.  Lymph: No cervical, supraclavicular, or infraclavicular lymphadenopathy noted on palpation.  Cardiovascular: Regular rate and rhythm.Marland Kitchen Respiratory: Clear to auscultation bilaterally. Chest expansion symmetric; breathing non-labored.  Breast Exam:  -Left breast: No appreciable masses on palpation. No skin redness, thickening, or peau d'orange appearance; no nipple retraction or nipple discharge; mild distortion in symmetry at previous lumpectomy site well healed scar without erythema or nodularity.  -Right breast: No appreciable masses on palpation. No skin redness, thickening, or peau d'orange appearance; no nipple retraction or nipple discharge; -Axilla: No axillary adenopathy bilaterally.  GI: Abdomen soft and round; non-tender, non-distended. Bowel sounds normoactive. No hepatosplenomegaly.   GU: Deferred.  Neuro: No focal deficits. Steady gait.  Psych: Mood and affect normal and appropriate for situation.  MSK: No focal spinal tenderness to palpation, full range of motion in bilateral upper extremities Extremities: No edema. Skin: Warm and dry.  LABORATORY DATA:  None for this visit   DIAGNOSTIC IMAGING:  Most recent mammogram:      ASSESSMENT AND PLAN:  Ms.. Rhude is a pleasant 56 y.o. female with history of Stage IA left breast invasive lobular carcinoma, ER+/PR+/HER2-, diagnosed in 08/2010, treated with lumpectomy, adjuvant radiation therapy, and anti-estrogen therapy with Anastrozole and Tamoxifen x 5 years.  [facility]  for surveillance and routine follow-up.   1. History of breast cancer:  Ms. Ogan is currently clinically and radiographically without evidence of disease or recurrence of breast cancer. She will be due for mammogram in 05/2017.  I encouraged her to call me with any questions or concerns before her next visit at the cancer center, and I would be happy to see her sooner, if needed.    2. Bone health:  Given Ms. Reasor's age, history of breast cancer, and her previous anti estrogen therapy, she is at risk for bone demineralization. Her last DEXA scan was in 2012 and was normal.  I have ordered one to be repeated today.  In the meantime, she was encouraged to increase her consumption of foods rich in calcium, as well as increase her weight-bearing activities.  She was given education on specific food and activities to promote bone health.  3. Cancer screening:  Due to Ms. Gaona's history and her age, she should receive screening for skin cancers, colon cancer, and gynecologic cancers. She was encouraged to follow-up with her PCP for appropriate cancer screenings.   4. Health  maintenance and wellness promotion: Ms. Mahone was encouraged to consume 5-7 servings of fruits and vegetables per day. She was also encouraged to engage in moderate to vigorous exercise for 30 minutes per day most days of the week. She was instructed to limit her alcohol consumption and continue to abstain from tobacco use.    Dispo:  -Return to cancer center in one year for LTS follow up -Mammogram is due in 05/2017 -Bone density at next available appointment at [facility]   A total of (30) minutes of face-to-face time was spent with this patient with greater than 50% of that time in counseling and care-coordination.   Gardenia Phlegm, [place] [phone]   Note: PRIMARY CARE PROVIDER Briscoe Deutscher, [place] [phone]

## 2016-12-07 DIAGNOSIS — R310 Gross hematuria: Secondary | ICD-10-CM | POA: Diagnosis not present

## 2016-12-07 DIAGNOSIS — G894 Chronic pain syndrome: Secondary | ICD-10-CM | POA: Diagnosis not present

## 2016-12-07 DIAGNOSIS — M5136 Other intervertebral disc degeneration, lumbar region: Secondary | ICD-10-CM | POA: Diagnosis not present

## 2016-12-08 DIAGNOSIS — M5136 Other intervertebral disc degeneration, lumbar region: Secondary | ICD-10-CM | POA: Diagnosis not present

## 2016-12-08 DIAGNOSIS — M545 Low back pain: Secondary | ICD-10-CM | POA: Diagnosis not present

## 2016-12-08 DIAGNOSIS — G8929 Other chronic pain: Secondary | ICD-10-CM | POA: Diagnosis not present

## 2016-12-12 ENCOUNTER — Other Ambulatory Visit: Payer: Self-pay | Admitting: Urology

## 2016-12-13 DIAGNOSIS — M9904 Segmental and somatic dysfunction of sacral region: Secondary | ICD-10-CM | POA: Diagnosis not present

## 2016-12-13 DIAGNOSIS — M9903 Segmental and somatic dysfunction of lumbar region: Secondary | ICD-10-CM | POA: Diagnosis not present

## 2016-12-13 DIAGNOSIS — M5136 Other intervertebral disc degeneration, lumbar region: Secondary | ICD-10-CM | POA: Diagnosis not present

## 2016-12-13 DIAGNOSIS — M9902 Segmental and somatic dysfunction of thoracic region: Secondary | ICD-10-CM | POA: Diagnosis not present

## 2016-12-18 DIAGNOSIS — G8929 Other chronic pain: Secondary | ICD-10-CM | POA: Diagnosis not present

## 2016-12-18 DIAGNOSIS — R310 Gross hematuria: Secondary | ICD-10-CM | POA: Diagnosis not present

## 2016-12-18 DIAGNOSIS — M545 Low back pain: Secondary | ICD-10-CM | POA: Diagnosis not present

## 2016-12-20 DIAGNOSIS — M533 Sacrococcygeal disorders, not elsewhere classified: Secondary | ICD-10-CM | POA: Diagnosis not present

## 2016-12-26 DIAGNOSIS — G8929 Other chronic pain: Secondary | ICD-10-CM | POA: Diagnosis not present

## 2016-12-26 DIAGNOSIS — M545 Low back pain: Secondary | ICD-10-CM | POA: Diagnosis not present

## 2016-12-29 DIAGNOSIS — R310 Gross hematuria: Secondary | ICD-10-CM | POA: Diagnosis not present

## 2017-01-04 ENCOUNTER — Ambulatory Visit
Admission: RE | Admit: 2017-01-04 | Discharge: 2017-01-04 | Disposition: A | Discharge status: Home / Self Care | Payer: BLUE CROSS/BLUE SHIELD | Source: Ambulatory Visit | Attending: Adult Health | Admitting: Adult Health

## 2017-01-04 DIAGNOSIS — M85852 Other specified disorders of bone density and structure, left thigh: Secondary | ICD-10-CM | POA: Diagnosis not present

## 2017-01-04 DIAGNOSIS — Z780 Asymptomatic menopausal state: Secondary | ICD-10-CM | POA: Diagnosis not present

## 2017-01-04 DIAGNOSIS — E2839 Other primary ovarian failure: Secondary | ICD-10-CM

## 2017-01-05 ENCOUNTER — Telehealth: Payer: Self-pay

## 2017-01-05 NOTE — Telephone Encounter (Signed)
Left VM to call back to speak to NP's nurse.

## 2017-01-08 ENCOUNTER — Telehealth: Payer: Self-pay

## 2017-01-08 NOTE — Telephone Encounter (Signed)
Patient left VM returning LPN call.  LPN spoke with patient this am and let her know that results of bone density scan show signs of early osteopenia.  Per NP continue calcium, vitamin D and weight bearing exercises.  Repeat test in two years.  Patient voiced understanding and had no questions at this time.

## 2017-01-10 DIAGNOSIS — M9904 Segmental and somatic dysfunction of sacral region: Secondary | ICD-10-CM | POA: Diagnosis not present

## 2017-01-10 DIAGNOSIS — M9902 Segmental and somatic dysfunction of thoracic region: Secondary | ICD-10-CM | POA: Diagnosis not present

## 2017-01-10 DIAGNOSIS — M9903 Segmental and somatic dysfunction of lumbar region: Secondary | ICD-10-CM | POA: Diagnosis not present

## 2017-01-10 DIAGNOSIS — M5136 Other intervertebral disc degeneration, lumbar region: Secondary | ICD-10-CM | POA: Diagnosis not present

## 2017-01-15 ENCOUNTER — Encounter (HOSPITAL_BASED_OUTPATIENT_CLINIC_OR_DEPARTMENT_OTHER): Payer: Self-pay | Admitting: *Deleted

## 2017-01-17 ENCOUNTER — Encounter (HOSPITAL_BASED_OUTPATIENT_CLINIC_OR_DEPARTMENT_OTHER): Payer: Self-pay | Admitting: *Deleted

## 2017-01-17 NOTE — Progress Notes (Signed)
NPO AFTER MN W/ EXCEPTION CLEAR LIQUIDS UNTIL 1030 (NO CREAM/ MILK PRODUCTS).  ARRIVE AT 1030.  NEEDS HG.

## 2017-01-19 ENCOUNTER — Ambulatory Visit (HOSPITAL_BASED_OUTPATIENT_CLINIC_OR_DEPARTMENT_OTHER): Payer: BLUE CROSS/BLUE SHIELD | Admitting: Anesthesiology

## 2017-01-19 ENCOUNTER — Encounter (HOSPITAL_BASED_OUTPATIENT_CLINIC_OR_DEPARTMENT_OTHER): Payer: Self-pay

## 2017-01-19 ENCOUNTER — Ambulatory Visit (HOSPITAL_COMMUNITY): Payer: BLUE CROSS/BLUE SHIELD

## 2017-01-19 ENCOUNTER — Encounter (HOSPITAL_BASED_OUTPATIENT_CLINIC_OR_DEPARTMENT_OTHER)
Admission: RE | Disposition: A | Discharge status: Home / Self Care | Payer: Self-pay | Source: Ambulatory Visit | Attending: Urology

## 2017-01-19 ENCOUNTER — Ambulatory Visit (HOSPITAL_BASED_OUTPATIENT_CLINIC_OR_DEPARTMENT_OTHER)
Admission: RE | Admit: 2017-01-19 | Discharge: 2017-01-19 | Disposition: A | Discharge status: Home / Self Care | Payer: BLUE CROSS/BLUE SHIELD | Source: Ambulatory Visit | Attending: Urology | Admitting: Urology

## 2017-01-19 DIAGNOSIS — Z923 Personal history of irradiation: Secondary | ICD-10-CM | POA: Insufficient documentation

## 2017-01-19 DIAGNOSIS — Z87891 Personal history of nicotine dependence: Secondary | ICD-10-CM | POA: Insufficient documentation

## 2017-01-19 DIAGNOSIS — Z79899 Other long term (current) drug therapy: Secondary | ICD-10-CM | POA: Diagnosis not present

## 2017-01-19 DIAGNOSIS — R310 Gross hematuria: Secondary | ICD-10-CM | POA: Diagnosis not present

## 2017-01-19 DIAGNOSIS — D090 Carcinoma in situ of bladder: Secondary | ICD-10-CM | POA: Insufficient documentation

## 2017-01-19 DIAGNOSIS — Z853 Personal history of malignant neoplasm of breast: Secondary | ICD-10-CM | POA: Diagnosis not present

## 2017-01-19 DIAGNOSIS — Z79890 Hormone replacement therapy (postmenopausal): Secondary | ICD-10-CM | POA: Diagnosis not present

## 2017-01-19 DIAGNOSIS — K219 Gastro-esophageal reflux disease without esophagitis: Secondary | ICD-10-CM | POA: Diagnosis not present

## 2017-01-19 DIAGNOSIS — D494 Neoplasm of unspecified behavior of bladder: Secondary | ICD-10-CM | POA: Diagnosis not present

## 2017-01-19 HISTORY — DX: Frequency of micturition: R35.0

## 2017-01-19 HISTORY — PX: CYSTOSCOPY W/ RETROGRADES: SHX1426

## 2017-01-19 HISTORY — DX: Neoplasm of unspecified behavior of bladder: D49.4

## 2017-01-19 HISTORY — PX: TRANSURETHRAL RESECTION OF BLADDER TUMOR: SHX2575

## 2017-01-19 HISTORY — DX: Hematuria, unspecified: R31.9

## 2017-01-19 HISTORY — DX: Personal history of malignant neoplasm of breast: Z85.3

## 2017-01-19 HISTORY — DX: Presence of spectacles and contact lenses: Z97.3

## 2017-01-19 HISTORY — DX: Personal history of irradiation: Z92.3

## 2017-01-19 LAB — POCT HEMOGLOBIN-HEMACUE: HEMOGLOBIN: 11.1 g/dL — AB (ref 12.0–15.0)

## 2017-01-19 SURGERY — TURBT (TRANSURETHRAL RESECTION OF BLADDER TUMOR)
Anesthesia: General | Site: Bladder

## 2017-01-19 MED ORDER — FENTANYL CITRATE (PF) 100 MCG/2ML IJ SOLN
INTRAMUSCULAR | Status: DC | PRN
Start: 2017-01-19 — End: 2017-01-19
  Administered 2017-01-19: 50 ug via INTRAVENOUS

## 2017-01-19 MED ORDER — PROMETHAZINE HCL 25 MG/ML IJ SOLN
6.2500 mg | INTRAMUSCULAR | Status: DC | PRN
  Filled 2017-01-19: qty 1

## 2017-01-19 MED ORDER — MIDAZOLAM HCL 2 MG/2ML IJ SOLN
0.5000 mg | Freq: Once | INTRAMUSCULAR | Status: DC | PRN
  Filled 2017-01-19: qty 2

## 2017-01-19 MED ORDER — LACTATED RINGERS IV SOLN
INTRAVENOUS | Status: DC
  Administered 2017-01-19: 11:00:00 via INTRAVENOUS
  Filled 2017-01-19: qty 1000

## 2017-01-19 MED ORDER — ROCURONIUM BROMIDE 50 MG/5ML IV SOSY
PREFILLED_SYRINGE | INTRAVENOUS | Status: DC | PRN
  Administered 2017-01-19: 5 mg via INTRAVENOUS
  Administered 2017-01-19: 20 mg via INTRAVENOUS

## 2017-01-19 MED ORDER — MIDAZOLAM HCL 5 MG/5ML IJ SOLN
INTRAMUSCULAR | Status: DC | PRN
  Administered 2017-01-19: 2 mg via INTRAVENOUS

## 2017-01-19 MED ORDER — LIDOCAINE 2% (20 MG/ML) 5 ML SYRINGE
INTRAMUSCULAR | Status: DC | PRN
  Administered 2017-01-19: 80 mg via INTRAVENOUS

## 2017-01-19 MED ORDER — PROPOFOL 10 MG/ML IV BOLUS
INTRAVENOUS | Status: DC | PRN
  Administered 2017-01-19: 130 mg via INTRAVENOUS

## 2017-01-19 MED ORDER — IOHEXOL 300 MG/ML  SOLN
INTRAMUSCULAR | Status: DC | PRN
  Administered 2017-01-19: 10 mL

## 2017-01-19 MED ORDER — SUCCINYLCHOLINE CHLORIDE 200 MG/10ML IV SOSY
PREFILLED_SYRINGE | INTRAVENOUS | Status: DC | PRN
  Administered 2017-01-19: 100 mg via INTRAVENOUS

## 2017-01-19 MED ORDER — CEFAZOLIN SODIUM-DEXTROSE 2-4 GM/100ML-% IV SOLN
INTRAVENOUS | Status: AC
  Filled 2017-01-19: qty 100

## 2017-01-19 MED ORDER — CEFAZOLIN SODIUM-DEXTROSE 2-4 GM/100ML-% IV SOLN
2.0000 g | INTRAVENOUS | Status: AC
  Administered 2017-01-19: 2 g via INTRAVENOUS
  Filled 2017-01-19: qty 100

## 2017-01-19 MED ORDER — FENTANYL CITRATE (PF) 100 MCG/2ML IJ SOLN
25.0000 ug | INTRAMUSCULAR | Status: DC | PRN
  Filled 2017-01-19: qty 1

## 2017-01-19 MED ORDER — SUGAMMADEX SODIUM 200 MG/2ML IV SOLN
INTRAVENOUS | Status: DC | PRN
  Administered 2017-01-19: 50 mg via INTRAVENOUS
  Administered 2017-01-19: 150 mg via INTRAVENOUS

## 2017-01-19 MED ORDER — KETOROLAC TROMETHAMINE 30 MG/ML IJ SOLN
INTRAMUSCULAR | Status: DC | PRN
  Administered 2017-01-19: 30 mg via INTRAVENOUS

## 2017-01-19 MED ORDER — DEXAMETHASONE SODIUM PHOSPHATE 4 MG/ML IJ SOLN
INTRAMUSCULAR | Status: DC | PRN
  Administered 2017-01-19: 10 mg via INTRAVENOUS

## 2017-01-19 MED ORDER — SODIUM CHLORIDE 0.9 % IR SOLN
Status: DC | PRN
  Administered 2017-01-19: 3000 mL via INTRAVESICAL

## 2017-01-19 MED ORDER — MEPERIDINE HCL 25 MG/ML IJ SOLN
6.2500 mg | INTRAMUSCULAR | Status: DC | PRN
  Filled 2017-01-19: qty 1

## 2017-01-19 MED ORDER — ONDANSETRON HCL 4 MG/2ML IJ SOLN
INTRAMUSCULAR | Status: DC | PRN
  Administered 2017-01-19: 4 mg via INTRAVENOUS

## 2017-01-19 MED ORDER — OXYCODONE-ACETAMINOPHEN 5-325 MG PO TABS: 1.0000 | ORAL_TABLET | Freq: Four times a day (QID) | ORAL | 0 refills | Status: DC | PRN

## 2017-01-19 SURGICAL SUPPLY — 22 items
BAG DRAIN URO-CYSTO SKYTR STRL (DRAIN) ×3 IMPLANT
BAG URINE DRAINAGE (UROLOGICAL SUPPLIES) ×3 IMPLANT
CATH FOLEY 3WAY 30CC 22F (CATHETERS) ×3 IMPLANT
CATH INTERMIT  6FR 70CM (CATHETERS) ×3 IMPLANT
CLOTH BEACON ORANGE TIMEOUT ST (SAFETY) ×3 IMPLANT
ELECT REM PT RETURN 9FT ADLT (ELECTROSURGICAL) ×3
ELECTRODE REM PT RTRN 9FT ADLT (ELECTROSURGICAL) ×2 IMPLANT
GLOVE BIO SURGEON STRL SZ8 (GLOVE) ×3 IMPLANT
GOWN STRL REUS W/TWL LRG LVL3 (GOWN DISPOSABLE) ×3 IMPLANT
GOWN STRL REUS W/TWL XL LVL3 (GOWN DISPOSABLE) ×3 IMPLANT
GUIDEWIRE ANG ZIPWIRE 038X150 (WIRE) ×3 IMPLANT
IV NS IRRIG 3000ML ARTHROMATIC (IV SOLUTION) ×3 IMPLANT
KIT RM TURNOVER CYSTO AR (KITS) ×3 IMPLANT
LOOP CUT BIPOLAR 24F LRG (ELECTROSURGICAL) ×3 IMPLANT
MANIFOLD NEPTUNE II (INSTRUMENTS) ×3 IMPLANT
PACK CYSTO (CUSTOM PROCEDURE TRAY) ×3 IMPLANT
PLUG CATH AND CAP STER (CATHETERS) ×3 IMPLANT
SYR 30ML LL (SYRINGE) ×3 IMPLANT
SYRINGE 10CC LL (SYRINGE) ×3 IMPLANT
TUBE CONNECTING 12X1/4 (SUCTIONS) ×3 IMPLANT
WATER STERILE IRR 3000ML UROMA (IV SOLUTION) ×3 IMPLANT
WATER STERILE IRR 500ML POUR (IV SOLUTION) ×3 IMPLANT

## 2017-01-19 NOTE — Transfer of Care (Signed)
  Last Vitals:  Vitals:   01/19/17 1036 01/19/17 1348  BP: 105/60   Pulse: (!) 58   Resp: 16   Temp: 36.9 C   SpO2: 100% (P) 100%    Last Pain:  Vitals:   01/19/17 1036  TempSrc: Oral      Patients Stated Pain Goal: 5 (01/19/17 1052)  Immediate Anesthesia Transfer of Care Note  Patient: Julie Villa  Procedure(s) Performed: Procedure(s) (LRB): TRANSURETHRAL RESECTION OF BLADDER TUMOR (TURBT) (N/A) CYSTOSCOPY WITH RETROGRADE PYELOGRAM (Bilateral)  Patient Location: PACU  Anesthesia Type: General  Level of Consciousness: awake, alert  and oriented  Airway & Oxygen Therapy: Patient Spontanous Breathing and Patient connected to nasal cannula oxygen  Post-op Assessment: Report given to PACU RN and Post -op Vital signs reviewed and stable  Post vital signs: Reviewed and stable  Complications: No apparent anesthesia complications

## 2017-01-19 NOTE — H&P (Signed)
Urology Admission H&P  Chief Complaint: bladder tumor  History of Present Illness: Ms Hight is a 56yo with a bladde tumor found on office cystoscopy  Past Medical History:  Diagnosis Date  . Bladder tumor   . Frequency of urination   . Hematuria   . History of left breast cancer dx 2012  --- oncologist-  dr Lindi Adie ([facility])-- per lov note, no recurrence   Left UIQ - Invasive ductial carcinoma, Invasive Lobular carcinoma w/ LCIS; Stage IA (T1b, N0, M0)--- s/p breast lumpectomy w/ snl dissection and radiation therapy completed 01-03-2011  . History of radiation therapy completed 01-03-2011   total 61Gy  , left breast   . Wears glasses    Past Surgical History:  Procedure Laterality Date  . APPENDECTOMY  1987  . BREAST LUMPECTOMY WITH AXILLARY LYMPH NODE DISSECTION Left 09-21-2010  dr Margot Chimes  [facility]  . CATARACT EXTRACTION W/ INTRAOCULAR LENS IMPLANT Right 06/2016  . NASAL SINUS SURGERY  06/2008    Home Medications:  Prescriptions Prior to Admission  Medication Sig Dispense Refill Last Dose  . Calcium Carbonate-Vitamin D (CALCIUM 600+D3 PO) Take 1 tablet by mouth 2 (two) times daily.   01/18/2017 at Unknown time  . CRANBERRY-VITAMIN C PO Take 500 mg by mouth 2 (two) times daily.   01/18/2017 at Unknown time  . Estrogens, Conjugated (PREMARIN VA) Place 0.5 mg vaginally 2 (two) times a week.   01/16/2017  . Ferrous Sulfate (IRON) 325 (65 Fe) MG TABS Take 1 tablet by mouth every morning.   01/18/2017 at Unknown time  . solifenacin (VESICARE) 10 MG tablet Take 10 mg by mouth every morning.    01/18/2017 at Unknown time  . vitamin C (ASCORBIC ACID) 500 MG tablet Take 500 mg by mouth daily.    01/18/2017 at Unknown time   Allergies:  Allergies  Allergen Reactions  . Penicillins Rash  . Sulfa Antibiotics Rash    Family History  Problem Relation Age of Onset  . Cancer Mother        breast  . Hypertension Mother   . Hyperlipidemia Mother   . Diabetes Father   . Heart  disease Father   . Hyperlipidemia Father   . Hypertension Father   . Diabetes Sister   . Hypertension Sister   . Hyperlipidemia Sister    Social History:  reports that she quit smoking about 35 years ago. Her smoking use included Cigarettes. She quit after 3.00 years of use. She has never used smokeless tobacco. She reports that she drinks about 0.6 oz of alcohol per week . She reports that she does not use drugs.  Review of Systems  All other systems reviewed and are negative.   Physical Exam:  Vital signs in last 24 hours: Temp:  [98.5 F (36.9 C)] 98.5 F (36.9 C) (10/12 1036) Pulse Rate:  [58] 58 (10/12 1036) Resp:  [16] 16 (10/12 1036) BP: (105)/(60) 105/60 (10/12 1036) SpO2:  [100 %] 100 % (10/12 1036) Weight:  [70.8 kg (156 lb)] 70.8 kg (156 lb) (10/12 1036) Physical Exam  Constitutional: She is oriented to person, place, and time. She appears well-developed and well-nourished.  HENT:  Head: Normocephalic and atraumatic.  Eyes: Pupils are equal, round, and reactive to light. EOM are normal.  Neck: Normal range of motion. No thyromegaly present.  Cardiovascular: Normal rate and regular rhythm.   Respiratory: Effort normal. No respiratory distress.  GI: Soft. She exhibits no distension.  Musculoskeletal: Normal range of motion.  She exhibits no edema.  Neurological: She is alert and oriented to person, place, and time.  Skin: Skin is warm and dry.  Psychiatric: She has a normal mood and affect. Her behavior is normal. Judgment and thought content normal.    Laboratory Data:  Results for orders placed or performed during the hospital encounter of 01/19/17 (from the past 24 hour(s))  Hemoglobin-hemacue, POC     Status: Abnormal   Collection Time: 01/19/17 11:08 AM  Result Value Ref Range   Hemoglobin 11.1 (L) 12.0 - 15.0 g/dL   No results found for this or any previous visit (from the past 240 hour(s)). Creatinine: No results for input(s): CREATININE in the last 168  hours. Baseline Creatinine: unknown  Impression/Assessment:  56yo with a bladder tumor  Plan:  The risks/benefits/alternatives to TURBT was explained to the patient and she understands and wishes to proceed with surgery  Nicolette Bang 01/19/2017, 12:44 PM

## 2017-01-19 NOTE — Anesthesia Postprocedure Evaluation (Signed)
Anesthesia Post Note  Patient: Marlowe Shores  Procedure(s) Performed: TRANSURETHRAL RESECTION OF BLADDER TUMOR (TURBT) (N/A Bladder) CYSTOSCOPY WITH RETROGRADE PYELOGRAM (Bilateral Bladder)     Patient location during evaluation: PACU Anesthesia Type: General Level of consciousness: awake and alert Pain management: pain level controlled Vital Signs Assessment: post-procedure vital signs reviewed and stable Respiratory status: spontaneous breathing, nonlabored ventilation and respiratory function stable Cardiovascular status: blood pressure returned to baseline and stable Postop Assessment: no apparent nausea or vomiting Anesthetic complications: no    Last Vitals:  Vitals:   01/19/17 1415 01/19/17 1430  BP: 122/74 118/73  Pulse: (!) 53 (!) 44  Resp: 13 10  Temp:    SpO2: 100% 100%    Last Pain:  Vitals:   01/19/17 1036  TempSrc: Oral                 Carlei Huang,W. EDMOND

## 2017-01-19 NOTE — Op Note (Signed)
.  Preoperative diagnosis: bladder tumor  Postoperative diagnosis: Same  Procedure: 1 cystoscopy 2. bilateral retrograde pyelography 3.  Intraoperative fluoroscopy, under one hour, with interpretation 4. Transurethral resection of bladder tumor, medium  Attending: Rosie Fate  Anesthesia: General  Estimated blood loss: Minimal  Drains: 22 French foley  Specimens: right lateral wall bladder tumor  Antibiotics: ancef  Findings:  3cm papillary right lateral wall tumor.  Ureteral orifices in normal anatomic location. No hydronephrosis or filling defects in either collecting system  Indications: Patient is a 56 year old female with a history of bladder tumor and gross hematuria.  After discussing treatment options, they decided proceed with transurethral resection of a bladder tumor.  Procedure her in detail: The patient was brought to the operating room and a brief timeout was done to ensure correct patient, correct procedure, correct site.  General anesthesia was administered patient was placed in dorsal lithotomy position.  Their genitalia was then prepped and draped in usual sterile fashion.  A rigid 80 French cystoscope was passed in the urethra and the bladder.  Bladder was inspected and we noted a 3cm bladder tumor.  the ureteral orifices were in the normal orthotopic locations.  a 6 french ureteral catheter was then instilled into the left ureteral orifice.  a gentle retrograde was obtained and findings noted above. We then turned our attention to the right side. a 6 french ureteral catheter was then instilled into the right ureteral orifice.  a gentle retrograde was obtained and findings noted above. We then removed the cystoscope and placed a resectoscope into the bladder. We proceeded to remove the large clot burden from the bladder. Once this was complete we turned our attention to the bladder tumor. Using the bipolar resectoscope we removed the bladder tumor down to the base. A  subsequent muscle deep biopsy was then taken. Hemostasis was then obtained with electrocautery. We then removed the bladder tumor chips and sent them for pathology. We then re-inspected the bladder and found no residula bleeding.  the bladder was then drained, a 22 French foley was placed and this concluded the procedure which was well tolerated by patient.  Complications: None  Condition: Stable, extubated, transferred to PACU  Plan: Patient is to be discharged home and followup in 5 days for foley catheter removal and pathology discussion.

## 2017-01-19 NOTE — Anesthesia Preprocedure Evaluation (Addendum)
Anesthesia Evaluation  Patient identified by MRN, date of birth, ID band Patient awake    Reviewed: Allergy & Precautions, NPO status , Patient's Chart, lab work & pertinent test results  History of Anesthesia Complications Negative for: history of anesthetic complications  Airway Mallampati: II  TM Distance: >3 FB Neck ROM: Full    Dental  (+) Dental Advisory Given   Pulmonary former smoker (quit 1983),    breath sounds clear to auscultation       Cardiovascular negative cardio ROS   Rhythm:Regular Rate:Normal     Neuro/Psych negative neurological ROS     GI/Hepatic Neg liver ROS, GERD  ,  Endo/Other  negative endocrine ROS  Renal/GU negative Renal ROS     Musculoskeletal   Abdominal   Peds  Hematology negative hematology ROS (+)   Anesthesia Other Findings S/p breast cancer  Reproductive/Obstetrics                            Anesthesia Physical Anesthesia Plan  ASA: II  Anesthesia Plan: General   Post-op Pain Management:    Induction: Intravenous  PONV Risk Score and Plan: 3 and Ondansetron, Dexamethasone and Midazolam  Airway Management Planned: Oral ETT  Additional Equipment:   Intra-op Plan:   Post-operative Plan: Extubation in OR  Informed Consent: I have reviewed the patients History and Physical, chart, labs and discussed the procedure including the risks, benefits and alternatives for the proposed anesthesia with the patient or authorized representative who has indicated his/her understanding and acceptance.   Dental advisory given  Plan Discussed with: CRNA and Surgeon  Anesthesia Plan Comments: (Plan routine monitors, GETA)        Anesthesia Quick Evaluation

## 2017-01-19 NOTE — Anesthesia Procedure Notes (Signed)
Procedure Name: Intubation Date/Time: 01/19/2017 12:52 PM Performed by: Annye Asa Pre-anesthesia Checklist: Patient identified, Emergency Drugs available, Suction available and Patient being monitored Patient Re-evaluated:Patient Re-evaluated prior to induction Oxygen Delivery Method: Circle system utilized Preoxygenation: Pre-oxygenation with 100% oxygen Induction Type: IV induction and Cricoid Pressure applied Ventilation: Mask ventilation without difficulty Laryngoscope Size: Mac and 3 Grade View: Grade I Tube type: Oral Tube size: 7.0 mm Number of attempts: 1 Airway Equipment and Method: Stylet and Oral airway Placement Confirmation: ETT inserted through vocal cords under direct vision,  positive ETCO2 and breath sounds checked- equal and bilateral Secured at: 21 cm Tube secured with: Tape Dental Injury: Teeth and Oropharynx as per pre-operative assessment

## 2017-01-19 NOTE — Discharge Instructions (Signed)
Indwelling Urinary Catheter Care, Adult Take good care of your catheter to keep it working and to prevent problems. How to wear your catheter Attach your catheter to your leg with tape (adhesive tape) or a leg strap. Make sure it is not too tight. If you use tape, remove any bits of tape that are already on the catheter. How to wear a drainage bag You should have:  A large overnight bag.  A small leg bag.  Overnight Bag You may wear the overnight bag at any time. Always keep the bag below the level of your bladder but off the floor. When you sleep, put a clean plastic bag in a wastebasket. Then hang the bag inside the wastebasket. Leg Bag Never wear the leg bag at night. Always wear the leg bag below your knee. Keep the leg bag secure with a leg strap or tape. How to care for your skin  Clean the skin around the catheter at least once every day.  Shower every day. Do not take baths.  Put creams, lotions, or ointments on your genital area only as told by your doctor.  Do not use powders, sprays, or lotions on your genital area. How to clean your catheter and your skin 1. Wash your hands with soap and water. 2. Wet a washcloth in warm water and gentle (mild) soap. 3. Use the washcloth to clean the skin where the catheter enters your body. Clean downward and wipe away from the catheter in small circles. Do not wipe toward the catheter. 4. Pat the area dry with a clean towel. Make sure to clean off all soap. How to care for your drainage bags Empty your drainage bag when it is ?- full or at least 2-3 times a day. Replace your drainage bag once a month or sooner if it starts to smell bad or look dirty. Do not clean your drainage bag unless told by your doctor. Emptying a drainage bag  Supplies Needed  Rubbing alcohol.  Gauze pad or cotton ball.  Tape or a leg strap.  Steps 1. Wash your hands with soap and water. 2. Separate (detach) the bag from your leg. 3. Hold the bag over  the toilet or a clean container. Keep the bag below your hips and bladder. This stops pee (urine) from going back into the tube. 4. Open the pour spout at the bottom of the bag. 5. Empty the pee into the toilet or container. Do not let the pour spout touch any surface. 6. Put rubbing alcohol on a gauze pad or cotton ball. 7. Use the gauze pad or cotton ball to clean the pour spout. 8. Close the pour spout. 9. Attach the bag to your leg with tape or a leg strap. 10. Wash your hands.  Changing a drainage bag Supplies Needed  Alcohol wipes.  A clean drainage bag.  Adhesive tape or a leg strap.  Steps 1. Wash your hands with soap and water. 2. Separate the dirty bag from your leg. 3. Pinch the rubber catheter with your fingers so that pee does not spill out. 4. Separate the catheter tube from the drainage tube where these tubes connect (at the connection valve). Do not let the tubes touch any surface. 5. Clean the end of the catheter tube with an alcohol wipe. Use a different alcohol wipe to clean the end of the drainage tube. 6. Connect the catheter tube to the drainage tube of the clean bag. 7. Attach the new bag to  the leg with adhesive tape or a leg strap. °8. Wash your hands. ° °How to prevent infection and other problems °· Never pull on your catheter or try to remove it. Pulling can damage tissue in your body. °· Always wash your hands before and after touching your catheter. °· If a leg strap gets wet, replace it with a dry one. °· Drink enough fluids to keep your pee clear or pale yellow, or as told by your doctor. °· Do not let the drainage bag or tubing touch the floor. °· Wear cotton underwear. °· If you are female, wipe from front to back after you poop (have a bowel movement). °· Check on the catheter often to make sure it works and the tubing is not twisted. °Get help if: °· Your pee is cloudy. °· Your pee smells unusually bad. °· Your pee is not draining into the bag. °· Your  tube gets clogged. °· Your catheter starts to leak. °· Your bladder feels full. °Get help right away if: °· You have redness, swelling, or pain where the catheter enters your body. °· You have fluid, pus, or a bad smell coming from the area where the catheter enters your body. °· The area where the catheter enters your body feels warm. °· You have a fever. °· You have pain in your: °? Stomach (abdomen). °? Legs. °? Lower back. °? Bladder. °· You see blood fill the catheter. °· Your pee is pink or red. °· You feel sick to your stomach (nauseous). °· You throw up (vomit). °· You have chills. °· Your catheter gets pulled out. ° ° °Post Anesthesia Home Care Instructions ° °Activity: °Get plenty of rest for the remainder of the day. A responsible individual must stay with you for 24 hours following the procedure.  °For the next 24 hours, DO NOT: °-Drive a car °-Operate machinery °-Drink alcoholic beverages °-Take any medication unless instructed by your physician °-Make any legal decisions or sign important papers. ° °Meals: °Start with liquid foods such as gelatin or soup. Progress to regular foods as tolerated. Avoid greasy, spicy, heavy foods. If nausea and/or vomiting occur, drink only clear liquids until the nausea and/or vomiting subsides. Call your physician if vomiting continues. ° °Special Instructions/Symptoms: °Your throat may feel dry or sore from the anesthesia or the breathing tube placed in your throat during surgery. If this causes discomfort, gargle with warm salt water. The discomfort should disappear within 24 hours. ° °If you had a scopolamine patch placed behind your ear for the management of post- operative nausea and/or vomiting: ° °1. The medication in the patch is effective for 72 hours, after which it should be removed.  Wrap patch in a tissue and discard in the trash. Wash hands thoroughly with soap and water. °2. You may remove the patch earlier than 72 hours if you experience unpleasant side  effects which may include dry mouth, dizziness or visual disturbances. °3. Avoid touching the patch. Wash your hands with soap and water after contact with the patch. °  ° °This information is not intended to replace advice given to you by your health care provider. Make sure you discuss any questions you have with your health care provider. °Document Released: 07/22/2012 Document Revised: 02/23/2016 Document Reviewed: 09/09/2013 °Elsevier Interactive Patient Education © 2018 [employer] ° °

## 2017-01-22 ENCOUNTER — Encounter (HOSPITAL_BASED_OUTPATIENT_CLINIC_OR_DEPARTMENT_OTHER): Payer: Self-pay | Admitting: Urology

## 2017-01-26 DIAGNOSIS — C672 Malignant neoplasm of lateral wall of bladder: Secondary | ICD-10-CM | POA: Diagnosis not present

## 2017-01-30 DIAGNOSIS — R350 Frequency of micturition: Secondary | ICD-10-CM | POA: Diagnosis not present

## 2017-01-30 DIAGNOSIS — R8271 Bacteriuria: Secondary | ICD-10-CM | POA: Diagnosis not present

## 2017-01-30 DIAGNOSIS — C672 Malignant neoplasm of lateral wall of bladder: Secondary | ICD-10-CM | POA: Diagnosis not present

## 2017-01-30 DIAGNOSIS — N390 Urinary tract infection, site not specified: Secondary | ICD-10-CM | POA: Diagnosis not present

## 2017-02-07 DIAGNOSIS — M9902 Segmental and somatic dysfunction of thoracic region: Secondary | ICD-10-CM | POA: Diagnosis not present

## 2017-02-07 DIAGNOSIS — M9903 Segmental and somatic dysfunction of lumbar region: Secondary | ICD-10-CM | POA: Diagnosis not present

## 2017-02-07 DIAGNOSIS — M5136 Other intervertebral disc degeneration, lumbar region: Secondary | ICD-10-CM | POA: Diagnosis not present

## 2017-02-07 DIAGNOSIS — M9904 Segmental and somatic dysfunction of sacral region: Secondary | ICD-10-CM | POA: Diagnosis not present

## 2017-02-14 DIAGNOSIS — N390 Urinary tract infection, site not specified: Secondary | ICD-10-CM | POA: Diagnosis not present

## 2017-02-14 DIAGNOSIS — C672 Malignant neoplasm of lateral wall of bladder: Secondary | ICD-10-CM | POA: Diagnosis not present

## 2017-02-20 DIAGNOSIS — C672 Malignant neoplasm of lateral wall of bladder: Secondary | ICD-10-CM | POA: Diagnosis not present

## 2017-02-20 DIAGNOSIS — R350 Frequency of micturition: Secondary | ICD-10-CM | POA: Diagnosis not present

## 2017-03-07 DIAGNOSIS — M9902 Segmental and somatic dysfunction of thoracic region: Secondary | ICD-10-CM | POA: Diagnosis not present

## 2017-03-07 DIAGNOSIS — M9904 Segmental and somatic dysfunction of sacral region: Secondary | ICD-10-CM | POA: Diagnosis not present

## 2017-03-07 DIAGNOSIS — M5136 Other intervertebral disc degeneration, lumbar region: Secondary | ICD-10-CM | POA: Diagnosis not present

## 2017-03-07 DIAGNOSIS — M9903 Segmental and somatic dysfunction of lumbar region: Secondary | ICD-10-CM | POA: Diagnosis not present

## 2017-03-28 DIAGNOSIS — M9903 Segmental and somatic dysfunction of lumbar region: Secondary | ICD-10-CM | POA: Diagnosis not present

## 2017-03-28 DIAGNOSIS — M9904 Segmental and somatic dysfunction of sacral region: Secondary | ICD-10-CM | POA: Diagnosis not present

## 2017-03-28 DIAGNOSIS — M9902 Segmental and somatic dysfunction of thoracic region: Secondary | ICD-10-CM | POA: Diagnosis not present

## 2017-03-28 DIAGNOSIS — M5136 Other intervertebral disc degeneration, lumbar region: Secondary | ICD-10-CM | POA: Diagnosis not present

## 2017-04-19 DIAGNOSIS — C672 Malignant neoplasm of lateral wall of bladder: Secondary | ICD-10-CM | POA: Diagnosis not present

## 2017-04-30 ENCOUNTER — Other Ambulatory Visit: Payer: Self-pay | Admitting: Hematology and Oncology

## 2017-04-30 DIAGNOSIS — Z9889 Other specified postprocedural states: Secondary | ICD-10-CM

## 2017-05-02 DIAGNOSIS — M5136 Other intervertebral disc degeneration, lumbar region: Secondary | ICD-10-CM | POA: Diagnosis not present

## 2017-05-02 DIAGNOSIS — M9902 Segmental and somatic dysfunction of thoracic region: Secondary | ICD-10-CM | POA: Diagnosis not present

## 2017-05-02 DIAGNOSIS — M9904 Segmental and somatic dysfunction of sacral region: Secondary | ICD-10-CM | POA: Diagnosis not present

## 2017-05-02 DIAGNOSIS — M9903 Segmental and somatic dysfunction of lumbar region: Secondary | ICD-10-CM | POA: Diagnosis not present

## 2017-05-24 ENCOUNTER — Ambulatory Visit
Admission: RE | Admit: 2017-05-24 | Discharge: 2017-05-24 | Disposition: A | Discharge status: Home / Self Care | Payer: BLUE CROSS/BLUE SHIELD | Source: Ambulatory Visit | Attending: Adult Health | Admitting: Adult Health

## 2017-05-24 DIAGNOSIS — R928 Other abnormal and inconclusive findings on diagnostic imaging of breast: Secondary | ICD-10-CM | POA: Diagnosis not present

## 2017-05-24 DIAGNOSIS — Z170 Estrogen receptor positive status [ER+]: Secondary | ICD-10-CM

## 2017-05-24 DIAGNOSIS — C50212 Malignant neoplasm of upper-inner quadrant of left female breast: Secondary | ICD-10-CM

## 2017-05-24 HISTORY — DX: Personal history of irradiation: Z92.3

## 2017-05-30 DIAGNOSIS — M5136 Other intervertebral disc degeneration, lumbar region: Secondary | ICD-10-CM | POA: Diagnosis not present

## 2017-05-30 DIAGNOSIS — M9904 Segmental and somatic dysfunction of sacral region: Secondary | ICD-10-CM | POA: Diagnosis not present

## 2017-05-30 DIAGNOSIS — M9903 Segmental and somatic dysfunction of lumbar region: Secondary | ICD-10-CM | POA: Diagnosis not present

## 2017-05-30 DIAGNOSIS — M9902 Segmental and somatic dysfunction of thoracic region: Secondary | ICD-10-CM | POA: Diagnosis not present

## 2017-06-13 DIAGNOSIS — M533 Sacrococcygeal disorders, not elsewhere classified: Secondary | ICD-10-CM | POA: Diagnosis not present

## 2017-06-27 DIAGNOSIS — M5136 Other intervertebral disc degeneration, lumbar region: Secondary | ICD-10-CM | POA: Diagnosis not present

## 2017-06-27 DIAGNOSIS — M9903 Segmental and somatic dysfunction of lumbar region: Secondary | ICD-10-CM | POA: Diagnosis not present

## 2017-06-27 DIAGNOSIS — M9904 Segmental and somatic dysfunction of sacral region: Secondary | ICD-10-CM | POA: Diagnosis not present

## 2017-06-27 DIAGNOSIS — M9902 Segmental and somatic dysfunction of thoracic region: Secondary | ICD-10-CM | POA: Diagnosis not present

## 2017-07-07 DIAGNOSIS — M419 Scoliosis, unspecified: Secondary | ICD-10-CM | POA: Diagnosis not present

## 2017-07-07 DIAGNOSIS — M5136 Other intervertebral disc degeneration, lumbar region: Secondary | ICD-10-CM | POA: Diagnosis not present

## 2017-07-07 DIAGNOSIS — M533 Sacrococcygeal disorders, not elsewhere classified: Secondary | ICD-10-CM | POA: Diagnosis not present

## 2017-07-25 DIAGNOSIS — M9903 Segmental and somatic dysfunction of lumbar region: Secondary | ICD-10-CM | POA: Diagnosis not present

## 2017-07-25 DIAGNOSIS — M5136 Other intervertebral disc degeneration, lumbar region: Secondary | ICD-10-CM | POA: Diagnosis not present

## 2017-07-25 DIAGNOSIS — M9902 Segmental and somatic dysfunction of thoracic region: Secondary | ICD-10-CM | POA: Diagnosis not present

## 2017-07-25 DIAGNOSIS — M9904 Segmental and somatic dysfunction of sacral region: Secondary | ICD-10-CM | POA: Diagnosis not present

## 2017-07-31 DIAGNOSIS — M5136 Other intervertebral disc degeneration, lumbar region: Secondary | ICD-10-CM | POA: Diagnosis not present

## 2017-08-15 DIAGNOSIS — M533 Sacrococcygeal disorders, not elsewhere classified: Secondary | ICD-10-CM | POA: Diagnosis not present

## 2017-08-15 DIAGNOSIS — M5136 Other intervertebral disc degeneration, lumbar region: Secondary | ICD-10-CM | POA: Diagnosis not present

## 2017-08-22 DIAGNOSIS — M9903 Segmental and somatic dysfunction of lumbar region: Secondary | ICD-10-CM | POA: Diagnosis not present

## 2017-08-22 DIAGNOSIS — M9902 Segmental and somatic dysfunction of thoracic region: Secondary | ICD-10-CM | POA: Diagnosis not present

## 2017-08-22 DIAGNOSIS — M5136 Other intervertebral disc degeneration, lumbar region: Secondary | ICD-10-CM | POA: Diagnosis not present

## 2017-08-22 DIAGNOSIS — M9904 Segmental and somatic dysfunction of sacral region: Secondary | ICD-10-CM | POA: Diagnosis not present

## 2017-08-23 DIAGNOSIS — M542 Cervicalgia: Secondary | ICD-10-CM | POA: Diagnosis not present

## 2017-08-27 DIAGNOSIS — R1031 Right lower quadrant pain: Secondary | ICD-10-CM | POA: Diagnosis not present

## 2017-08-27 DIAGNOSIS — R1032 Left lower quadrant pain: Secondary | ICD-10-CM | POA: Diagnosis not present

## 2017-08-27 DIAGNOSIS — R197 Diarrhea, unspecified: Secondary | ICD-10-CM | POA: Diagnosis not present

## 2017-08-28 DIAGNOSIS — R197 Diarrhea, unspecified: Secondary | ICD-10-CM | POA: Diagnosis not present

## 2017-08-31 DIAGNOSIS — M5136 Other intervertebral disc degeneration, lumbar region: Secondary | ICD-10-CM | POA: Diagnosis not present

## 2017-08-31 DIAGNOSIS — M5412 Radiculopathy, cervical region: Secondary | ICD-10-CM | POA: Diagnosis not present

## 2017-08-31 DIAGNOSIS — M7061 Trochanteric bursitis, right hip: Secondary | ICD-10-CM | POA: Diagnosis not present

## 2017-08-31 DIAGNOSIS — M533 Sacrococcygeal disorders, not elsewhere classified: Secondary | ICD-10-CM | POA: Diagnosis not present

## 2017-08-31 DIAGNOSIS — M419 Scoliosis, unspecified: Secondary | ICD-10-CM | POA: Diagnosis not present

## 2017-08-31 DIAGNOSIS — M7062 Trochanteric bursitis, left hip: Secondary | ICD-10-CM | POA: Diagnosis not present

## 2017-09-10 DIAGNOSIS — K52831 Collagenous colitis: Secondary | ICD-10-CM | POA: Diagnosis not present

## 2017-09-11 DIAGNOSIS — L02413 Cutaneous abscess of right upper limb: Secondary | ICD-10-CM | POA: Diagnosis not present

## 2017-09-11 DIAGNOSIS — L723 Sebaceous cyst: Secondary | ICD-10-CM | POA: Diagnosis not present

## 2017-09-13 DIAGNOSIS — Z124 Encounter for screening for malignant neoplasm of cervix: Secondary | ICD-10-CM | POA: Diagnosis not present

## 2017-09-13 DIAGNOSIS — Z0000 Encounter for general adult medical examination without abnormal findings: Secondary | ICD-10-CM | POA: Diagnosis not present

## 2017-09-13 DIAGNOSIS — Z13220 Encounter for screening for lipoid disorders: Secondary | ICD-10-CM | POA: Diagnosis not present

## 2017-09-19 DIAGNOSIS — M5136 Other intervertebral disc degeneration, lumbar region: Secondary | ICD-10-CM | POA: Diagnosis not present

## 2017-09-19 DIAGNOSIS — M9902 Segmental and somatic dysfunction of thoracic region: Secondary | ICD-10-CM | POA: Diagnosis not present

## 2017-09-19 DIAGNOSIS — M9903 Segmental and somatic dysfunction of lumbar region: Secondary | ICD-10-CM | POA: Diagnosis not present

## 2017-09-19 DIAGNOSIS — M9904 Segmental and somatic dysfunction of sacral region: Secondary | ICD-10-CM | POA: Diagnosis not present

## 2017-10-24 ENCOUNTER — Ambulatory Visit: Payer: BLUE CROSS/BLUE SHIELD | Admitting: Neurology

## 2017-10-24 DIAGNOSIS — M9904 Segmental and somatic dysfunction of sacral region: Secondary | ICD-10-CM | POA: Diagnosis not present

## 2017-10-24 DIAGNOSIS — M9902 Segmental and somatic dysfunction of thoracic region: Secondary | ICD-10-CM | POA: Diagnosis not present

## 2017-10-24 DIAGNOSIS — M5136 Other intervertebral disc degeneration, lumbar region: Secondary | ICD-10-CM | POA: Diagnosis not present

## 2017-10-24 DIAGNOSIS — M9903 Segmental and somatic dysfunction of lumbar region: Secondary | ICD-10-CM | POA: Diagnosis not present

## 2017-10-25 DIAGNOSIS — R300 Dysuria: Secondary | ICD-10-CM | POA: Diagnosis not present

## 2017-10-30 DIAGNOSIS — C672 Malignant neoplasm of lateral wall of bladder: Secondary | ICD-10-CM | POA: Diagnosis not present

## 2017-11-06 ENCOUNTER — Other Ambulatory Visit: Payer: Self-pay | Admitting: Urology

## 2017-11-16 ENCOUNTER — Encounter (HOSPITAL_BASED_OUTPATIENT_CLINIC_OR_DEPARTMENT_OTHER): Payer: Self-pay | Admitting: *Deleted

## 2017-11-16 ENCOUNTER — Other Ambulatory Visit: Payer: Self-pay

## 2017-11-16 NOTE — Progress Notes (Signed)
Spoke with Julie Villa after midnight arrive 1015 am [facility] 11-29-17 Take the following medication s with sip of water am of surgery: bodesonide, vesicare Driver spouse bill will stay for procedure Orders in epic no labs needed

## 2017-11-21 DIAGNOSIS — M9903 Segmental and somatic dysfunction of lumbar region: Secondary | ICD-10-CM | POA: Diagnosis not present

## 2017-11-21 DIAGNOSIS — M5136 Other intervertebral disc degeneration, lumbar region: Secondary | ICD-10-CM | POA: Diagnosis not present

## 2017-11-21 DIAGNOSIS — M9904 Segmental and somatic dysfunction of sacral region: Secondary | ICD-10-CM | POA: Diagnosis not present

## 2017-11-21 DIAGNOSIS — M9902 Segmental and somatic dysfunction of thoracic region: Secondary | ICD-10-CM | POA: Diagnosis not present

## 2017-11-23 ENCOUNTER — Telehealth: Payer: Self-pay | Admitting: Adult Health

## 2017-11-23 NOTE — Telephone Encounter (Signed)
Called patient regarding 9/3

## 2017-11-29 ENCOUNTER — Ambulatory Visit (HOSPITAL_BASED_OUTPATIENT_CLINIC_OR_DEPARTMENT_OTHER): Payer: BLUE CROSS/BLUE SHIELD | Admitting: Anesthesiology

## 2017-11-29 ENCOUNTER — Ambulatory Visit (HOSPITAL_COMMUNITY)
Admission: RE | Admit: 2017-11-29 | Discharge: 2017-11-29 | Disposition: A | Discharge status: Home / Self Care | Payer: BLUE CROSS/BLUE SHIELD | Source: Ambulatory Visit | Attending: Urology | Admitting: Urology

## 2017-11-29 ENCOUNTER — Encounter (HOSPITAL_BASED_OUTPATIENT_CLINIC_OR_DEPARTMENT_OTHER): Payer: Self-pay

## 2017-11-29 ENCOUNTER — Encounter (HOSPITAL_BASED_OUTPATIENT_CLINIC_OR_DEPARTMENT_OTHER)
Admission: RE | Disposition: A | Discharge status: Home / Self Care | Payer: Self-pay | Source: Ambulatory Visit | Attending: Urology

## 2017-11-29 DIAGNOSIS — Z9841 Cataract extraction status, right eye: Secondary | ICD-10-CM | POA: Diagnosis not present

## 2017-11-29 DIAGNOSIS — Z853 Personal history of malignant neoplasm of breast: Secondary | ICD-10-CM | POA: Diagnosis not present

## 2017-11-29 DIAGNOSIS — Z961 Presence of intraocular lens: Secondary | ICD-10-CM | POA: Insufficient documentation

## 2017-11-29 DIAGNOSIS — D414 Neoplasm of uncertain behavior of bladder: Secondary | ICD-10-CM | POA: Diagnosis not present

## 2017-11-29 DIAGNOSIS — Z79890 Hormone replacement therapy (postmenopausal): Secondary | ICD-10-CM | POA: Insufficient documentation

## 2017-11-29 DIAGNOSIS — Z803 Family history of malignant neoplasm of breast: Secondary | ICD-10-CM | POA: Diagnosis not present

## 2017-11-29 DIAGNOSIS — Z882 Allergy status to sulfonamides status: Secondary | ICD-10-CM | POA: Diagnosis not present

## 2017-11-29 DIAGNOSIS — Z79899 Other long term (current) drug therapy: Secondary | ICD-10-CM | POA: Diagnosis not present

## 2017-11-29 DIAGNOSIS — D494 Neoplasm of unspecified behavior of bladder: Secondary | ICD-10-CM | POA: Diagnosis not present

## 2017-11-29 DIAGNOSIS — Z87891 Personal history of nicotine dependence: Secondary | ICD-10-CM | POA: Insufficient documentation

## 2017-11-29 DIAGNOSIS — Z880 Allergy status to penicillin: Secondary | ICD-10-CM | POA: Insufficient documentation

## 2017-11-29 DIAGNOSIS — Z8249 Family history of ischemic heart disease and other diseases of the circulatory system: Secondary | ICD-10-CM | POA: Diagnosis not present

## 2017-11-29 DIAGNOSIS — Z923 Personal history of irradiation: Secondary | ICD-10-CM | POA: Diagnosis not present

## 2017-11-29 HISTORY — DX: Anemia, unspecified: D64.9

## 2017-11-29 HISTORY — PX: TRANSURETHRAL RESECTION OF BLADDER TUMOR: SHX2575

## 2017-11-29 SURGERY — TURBT (TRANSURETHRAL RESECTION OF BLADDER TUMOR)
Anesthesia: General | Site: Bladder

## 2017-11-29 MED ORDER — OXYCODONE-ACETAMINOPHEN 5-325 MG PO TABS: 1.0000 | ORAL_TABLET | Freq: Four times a day (QID) | ORAL | 0 refills | Status: AC | PRN

## 2017-11-29 MED ORDER — PROPOFOL 10 MG/ML IV BOLUS
INTRAVENOUS | Status: AC
  Filled 2017-11-29: qty 20

## 2017-11-29 MED ORDER — CEFAZOLIN SODIUM-DEXTROSE 2-4 GM/100ML-% IV SOLN
INTRAVENOUS | Status: AC
  Filled 2017-11-29: qty 100

## 2017-11-29 MED ORDER — HYDROMORPHONE HCL 1 MG/ML IJ SOLN
0.2500 mg | INTRAMUSCULAR | Status: DC | PRN
  Filled 2017-11-29: qty 0.5

## 2017-11-29 MED ORDER — SUGAMMADEX SODIUM 200 MG/2ML IV SOLN
INTRAVENOUS | Status: DC | PRN
  Administered 2017-11-29: 200 mg via INTRAVENOUS

## 2017-11-29 MED ORDER — FENTANYL CITRATE (PF) 100 MCG/2ML IJ SOLN
INTRAMUSCULAR | Status: DC | PRN
  Administered 2017-11-29: 50 ug via INTRAVENOUS

## 2017-11-29 MED ORDER — SUGAMMADEX SODIUM 200 MG/2ML IV SOLN
INTRAVENOUS | Status: AC
  Filled 2017-11-29: qty 2

## 2017-11-29 MED ORDER — ONDANSETRON HCL 4 MG/2ML IJ SOLN
INTRAMUSCULAR | Status: DC | PRN
  Administered 2017-11-29: 4 mg via INTRAVENOUS

## 2017-11-29 MED ORDER — CEFAZOLIN SODIUM-DEXTROSE 2-4 GM/100ML-% IV SOLN
2.0000 g | INTRAVENOUS | Status: AC
  Administered 2017-11-29: 2 g via INTRAVENOUS
  Filled 2017-11-29: qty 100

## 2017-11-29 MED ORDER — MIDAZOLAM HCL 2 MG/2ML IJ SOLN
INTRAMUSCULAR | Status: AC
  Filled 2017-11-29: qty 2

## 2017-11-29 MED ORDER — SCOPOLAMINE 1 MG/3DAYS TD PT72
1.0000 | MEDICATED_PATCH | TRANSDERMAL | Status: DC
  Filled 2017-11-29: qty 1

## 2017-11-29 MED ORDER — DEXAMETHASONE SODIUM PHOSPHATE 10 MG/ML IJ SOLN
INTRAMUSCULAR | Status: AC
  Filled 2017-11-29: qty 1

## 2017-11-29 MED ORDER — ROCURONIUM BROMIDE 100 MG/10ML IV SOLN
INTRAVENOUS | Status: DC | PRN
  Administered 2017-11-29: 40 mg via INTRAVENOUS

## 2017-11-29 MED ORDER — FENTANYL CITRATE (PF) 100 MCG/2ML IJ SOLN
INTRAMUSCULAR | Status: AC
  Filled 2017-11-29: qty 2

## 2017-11-29 MED ORDER — ONDANSETRON HCL 4 MG/2ML IJ SOLN
INTRAMUSCULAR | Status: AC
  Filled 2017-11-29: qty 2

## 2017-11-29 MED ORDER — DEXAMETHASONE SODIUM PHOSPHATE 4 MG/ML IJ SOLN
INTRAMUSCULAR | Status: DC | PRN
  Administered 2017-11-29: 10 mg via INTRAVENOUS

## 2017-11-29 MED ORDER — SODIUM CHLORIDE 0.9 % IR SOLN
Status: DC | PRN
  Administered 2017-11-29: 3000 mL via INTRAVESICAL

## 2017-11-29 MED ORDER — LIDOCAINE 2% (20 MG/ML) 5 ML SYRINGE
INTRAMUSCULAR | Status: DC | PRN
  Administered 2017-11-29: 60 mg via INTRAVENOUS

## 2017-11-29 MED ORDER — PROMETHAZINE HCL 25 MG/ML IJ SOLN
6.2500 mg | INTRAMUSCULAR | Status: DC | PRN
  Filled 2017-11-29: qty 1

## 2017-11-29 MED ORDER — LACTATED RINGERS IV SOLN
INTRAVENOUS | Status: DC
  Administered 2017-11-29 (×2): via INTRAVENOUS
  Filled 2017-11-29: qty 1000

## 2017-11-29 MED ORDER — SCOPOLAMINE 1 MG/3DAYS TD PT72
MEDICATED_PATCH | TRANSDERMAL | Status: AC
  Filled 2017-11-29: qty 1

## 2017-11-29 MED ORDER — PROPOFOL 10 MG/ML IV BOLUS
INTRAVENOUS | Status: DC | PRN
  Administered 2017-11-29: 150 mg via INTRAVENOUS

## 2017-11-29 MED ORDER — LIDOCAINE 2% (20 MG/ML) 5 ML SYRINGE
INTRAMUSCULAR | Status: AC
  Filled 2017-11-29: qty 5

## 2017-11-29 MED ORDER — SCOPOLAMINE 1 MG/3DAYS TD PT72
1.0000 | MEDICATED_PATCH | TRANSDERMAL | Status: DC
  Administered 2017-11-29: 1.5 mg via TRANSDERMAL
  Filled 2017-11-29: qty 1

## 2017-11-29 MED ORDER — ESMOLOL HCL 100 MG/10ML IV SOLN
INTRAVENOUS | Status: AC
  Filled 2017-11-29: qty 10

## 2017-11-29 MED ORDER — MIDAZOLAM HCL 5 MG/5ML IJ SOLN
INTRAMUSCULAR | Status: DC | PRN
  Administered 2017-11-29: 2 mg via INTRAVENOUS

## 2017-11-29 SURGICAL SUPPLY — 21 items
BAG DRAIN URO-CYSTO SKYTR STRL (DRAIN) ×2 IMPLANT
BAG URINE DRAINAGE (UROLOGICAL SUPPLIES) ×2 IMPLANT
BAG URINE LEG 19OZ MD ST LTX (BAG) IMPLANT
CATH FOLEY 3WAY 30CC 22F (CATHETERS) ×2 IMPLANT
CLOTH BEACON ORANGE TIMEOUT ST (SAFETY) ×2 IMPLANT
ELECT REM PT RETURN 9FT ADLT (ELECTROSURGICAL) ×2
ELECTRODE REM PT RTRN 9FT ADLT (ELECTROSURGICAL) ×1 IMPLANT
EVACUATOR MICROVAS BLADDER (UROLOGICAL SUPPLIES) IMPLANT
GLOVE BIO SURGEON STRL SZ8 (GLOVE) ×4 IMPLANT
GLOVE BIOGEL PI IND STRL 8.5 (GLOVE) ×1 IMPLANT
GLOVE BIOGEL PI INDICATOR 8.5 (GLOVE) ×1
GOWN STRL REUS W/TWL LRG LVL3 (GOWN DISPOSABLE) ×2 IMPLANT
GOWN STRL REUS W/TWL XL LVL3 (GOWN DISPOSABLE) ×2 IMPLANT
IV NS IRRIG 3000ML ARTHROMATIC (IV SOLUTION) ×2 IMPLANT
KIT TURNOVER CYSTO (KITS) ×2 IMPLANT
MANIFOLD NEPTUNE II (INSTRUMENTS) ×2 IMPLANT
PACK CYSTO (CUSTOM PROCEDURE TRAY) ×2 IMPLANT
PLUG CATH AND CAP STER (CATHETERS) ×2 IMPLANT
SYR 30ML LL (SYRINGE) ×2 IMPLANT
SYRINGE IRR TOOMEY STRL 70CC (SYRINGE) IMPLANT
TUBE CONNECTING 12X1/4 (SUCTIONS) ×2 IMPLANT

## 2017-11-29 NOTE — Discharge Instructions (Signed)
Indwelling Urinary Catheter Care, Adult °Take good care of your catheter to keep it working and to prevent problems. °How to wear your catheter °Attach your catheter to your leg with tape (adhesive tape) or a leg strap. Make sure it is not too tight. If you use tape, remove any bits of tape that are already on the catheter. °How to wear a drainage bag °You should have: °· A large overnight bag. °· A small leg bag. ° °Overnight Bag °You may wear the overnight bag at any time. Always keep the bag below the level of your bladder but off the floor. When you sleep, put a clean plastic bag in a wastebasket. Then hang the bag inside the wastebasket. °Leg Bag °Never wear the leg bag at night. Always wear the leg bag below your knee. Keep the leg bag secure with a leg strap or tape. °How to care for your skin °· Clean the skin around the catheter at least once every day. °· Shower every day. Do not take baths. °· Put creams, lotions, or ointments on your genital area only as told by your doctor. °· Do not use powders, sprays, or lotions on your genital area. °How to clean your catheter and your skin °1. Wash your hands with soap and water. °2. Wet a washcloth in warm water and gentle (mild) soap. °3. Use the washcloth to clean the skin where the catheter enters your body. Clean downward and wipe away from the catheter in small circles. Do not wipe toward the catheter. °4. Pat the area dry with a clean towel. Make sure to clean off all soap. °How to care for your drainage bags °Empty your drainage bag when it is ?-½ full or at least 2-3 times a day. Replace your drainage bag once a month or sooner if it starts to smell bad or look dirty. Do not clean your drainage bag unless told by your doctor. °Emptying a drainage bag ° °Supplies Needed °· Rubbing alcohol. °· Gauze pad or cotton ball. °· Tape or a leg strap. ° °Steps °1. Wash your hands with soap and water. °2. Separate (detach) the bag from your leg. °3. Hold the bag over  the toilet or a clean container. Keep the bag below your hips and bladder. This stops pee (urine) from going back into the tube. °4. Open the pour spout at the bottom of the bag. °5. Empty the pee into the toilet or container. Do not let the pour spout touch any surface. °6. Put rubbing alcohol on a gauze pad or cotton ball. °7. Use the gauze pad or cotton ball to clean the pour spout. °8. Close the pour spout. °9. Attach the bag to your leg with tape or a leg strap. °10. Wash your hands. ° °Changing a drainage bag °Supplies Needed °· Alcohol wipes. °· A clean drainage bag. °· Adhesive tape or a leg strap. ° °Steps °1. Wash your hands with soap and water. °2. Separate the dirty bag from your leg. °3. Pinch the rubber catheter with your fingers so that pee does not spill out. °4. Separate the catheter tube from the drainage tube where these tubes connect (at the connection valve). Do not let the tubes touch any surface. °5. Clean the end of the catheter tube with an alcohol wipe. Use a different alcohol wipe to clean the end of the drainage tube. °6. Connect the catheter tube to the drainage tube of the clean bag. °7. Attach the new bag to   the leg with adhesive tape or a leg strap. °8. Wash your hands. ° °How to prevent infection and other problems °· Never pull on your catheter or try to remove it. Pulling can damage tissue in your body. °· Always wash your hands before and after touching your catheter. °· If a leg strap gets wet, replace it with a dry one. °· Drink enough fluids to keep your pee clear or pale yellow, or as told by your doctor. °· Do not let the drainage bag or tubing touch the floor. °· Wear cotton underwear. °· If you are female, wipe from front to back after you poop (have a bowel movement). °· Check on the catheter often to make sure it works and the tubing is not twisted. °Get help if: °· Your pee is cloudy. °· Your pee smells unusually bad. °· Your pee is not draining into the bag. °· Your  tube gets clogged. °· Your catheter starts to leak. °· Your bladder feels full. °Get help right away if: °· You have redness, swelling, or pain where the catheter enters your body. °· You have fluid, pus, or a bad smell coming from the area where the catheter enters your body. °· The area where the catheter enters your body feels warm. °· You have a fever. °· You have pain in your: °? Stomach (abdomen). °? Legs. °? Lower back. °? Bladder. °· You see blood fill the catheter. °· Your pee is pink or red. °· You feel sick to your stomach (nauseous). °· You throw up (vomit). °· You have chills. °· Your catheter gets pulled out. °This information is not intended to replace advice given to you by your health care provider. Make sure you discuss any questions you have with your health care provider. °Document Released: 07/22/2012 Document Revised: 02/23/2016 Document Reviewed: 09/09/2013 °Elsevier Interactive Patient Education © 2018 [employer] ° ° ° ° °Post Anesthesia Home Care Instructions ° °Activity: °Get plenty of rest for the remainder of the day. A responsible individual must stay with you for 24 hours following the procedure.  °For the next 24 hours, DO NOT: °-Drive a car °-Operate machinery °-Drink alcoholic beverages °-Take any medication unless instructed by your physician °-Make any legal decisions or sign important papers. ° °Meals: °Start with liquid foods such as gelatin or soup. Progress to regular foods as tolerated. Avoid greasy, spicy, heavy foods. If nausea and/or vomiting occur, drink only clear liquids until the nausea and/or vomiting subsides. Call your physician if vomiting continues. ° °Special Instructions/Symptoms: °Your throat may feel dry or sore from the anesthesia or the breathing tube placed in your throat during surgery. If this causes discomfort, gargle with warm salt water. The discomfort should disappear within 24 hours. ° °If you had a scopolamine patch placed behind your ear for the  management of post- operative nausea and/or vomiting: ° °1. The medication in the patch is effective for 72 hours, after which it should be removed.  Wrap patch in a tissue and discard in the trash. Wash hands thoroughly with soap and water. °2. You may remove the patch earlier than 72 hours if you experience unpleasant side effects which may include dry mouth, dizziness or visual disturbances. °3. Avoid touching the patch. Wash your hands with soap and water after contact with the patch. °  ° °

## 2017-11-29 NOTE — Transfer of Care (Signed)
Immediate Anesthesia Transfer of Care Note  Patient: Julie Villa  Procedure(s) Performed: TRANSURETHRAL RESECTION OF BLADDER TUMOR (TURBT) (N/A Bladder)  Patient Location: PACU  Anesthesia Type:General  Level of Consciousness: awake  Airway & Oxygen Therapy: Patient Spontanous Breathing and Patient connected to face mask oxygen  Post-op Assessment: Report given to RN and Post -op Vital signs reviewed and stable  Post vital signs: Reviewed and stable  Last Vitals:  Vitals Value Taken Time  BP 114/71 11/29/2017  1:36 PM  Temp    Pulse 70 11/29/2017  1:40 PM  Resp 13 11/29/2017  1:40 PM  SpO2 100 % 11/29/2017  1:40 PM  Vitals shown include unvalidated device data.  Last Pain:  Vitals:   11/29/17 1022  TempSrc:   PainSc: 0-No pain      Patients Stated Pain Goal: 7 (79/21/78 3754)  Complications: No apparent anesthesia complications

## 2017-11-29 NOTE — Anesthesia Preprocedure Evaluation (Signed)
Anesthesia Evaluation  Patient identified by MRN, date of birth, ID band Patient awake    Reviewed: Allergy & Precautions, NPO status , Patient's Chart, lab work & pertinent test results  History of Anesthesia Complications Negative for: history of anesthetic complications  Airway Mallampati: II  TM Distance: >3 FB Neck ROM: Full    Dental  (+) Dental Advisory Given   Pulmonary former smoker,    breath sounds clear to auscultation       Cardiovascular negative cardio ROS   Rhythm:Regular Rate:Normal     Neuro/Psych negative neurological ROS     GI/Hepatic Neg liver ROS, GERD  ,  Endo/Other  negative endocrine ROS  Renal/GU negative Renal ROS     Musculoskeletal   Abdominal   Peds  Hematology negative hematology ROS (+)   Anesthesia Other Findings S/p breast cancer  Reproductive/Obstetrics                             Anesthesia Physical  Anesthesia Plan  ASA: II  Anesthesia Plan: General   Post-op Pain Management:    Induction: Intravenous  PONV Risk Score and Plan: 3 and Ondansetron, Dexamethasone and Midazolam  Airway Management Planned: Oral ETT  Additional Equipment:   Intra-op Plan:   Post-operative Plan: Extubation in OR  Informed Consent: I have reviewed the patients History and Physical, chart, labs and discussed the procedure including the risks, benefits and alternatives for the proposed anesthesia with the patient or authorized representative who has indicated his/her understanding and acceptance.   Dental advisory given  Plan Discussed with: CRNA and Surgeon  Anesthesia Plan Comments:         Anesthesia Quick Evaluation

## 2017-11-29 NOTE — H&P (Signed)
Urology Admission H&P  Chief Complaint: Bladder cancer  History of Present Illness: [name] is a 57yo with a hx of recurrent bladder tumors here for repeat resection. She denies any hematuria. No dysuria  Past Medical History:  Diagnosis Date  . Anemia    hx of several yrs ago  . Bladder tumor   . Frequency of urination   . Hematuria   . History of left breast cancer dx 2012  --- oncologist-  dr Lindi Adie ([facility])-- per lov note, no recurrence   Left UIQ - Invasive ductial carcinoma, Invasive Lobular carcinoma w/ LCIS; Stage IA (T1b, N0, M0)--- s/p breast lumpectomy w/ snl dissection and radiation therapy completed 01-03-2011  . History of radiation therapy completed 01-03-2011   total 61Gy  , left breast   . History of radiation therapy 2012  . Personal history of radiation therapy   . Wears glasses    Past Surgical History:  Procedure Laterality Date  . APPENDECTOMY  1987  . BREAST LUMPECTOMY Left 2012  . BREAST LUMPECTOMY WITH AXILLARY LYMPH NODE DISSECTION Left 09-21-2010  dr Margot Chimes  [facility]  . CATARACT EXTRACTION W/ INTRAOCULAR LENS IMPLANT Right 06/2016  . CYSTOSCOPY W/ RETROGRADES Bilateral 01/19/2017   Procedure: CYSTOSCOPY WITH RETROGRADE PYELOGRAM;  Surgeon: Cleon Gustin, MD;  Location: [facility];  Service: Urology;  Laterality: Bilateral;  . NASAL SINUS SURGERY  06/2008  . TRANSURETHRAL RESECTION OF BLADDER TUMOR N/A 01/19/2017   Procedure: TRANSURETHRAL RESECTION OF BLADDER TUMOR (TURBT);  Surgeon: Cleon Gustin, MD;  Location: [facility];  Service: Urology;  Laterality: N/A;    Home Medications:  Current Facility-Administered Medications  Medication Dose Route Frequency Provider Last Rate Last Dose  . ceFAZolin (ANCEF) IVPB 2g/100 mL premix  2 g Intravenous 30 min Pre-Op Safari Cinque, Candee Furbish, MD      . lactated ringers infusion   Intravenous Continuous Duane Boston, MD 50 mL/hr at 11/29/17 1032    . scopolamine  (TRANSDERM-SCOP) 1 MG/3DAYS 1.5 mg  1 patch Transdermal Q72H Duane Boston, MD   1.5 mg at 11/29/17 1032  . scopolamine (TRANSDERM-SCOP) 1 MG/3DAYS 1.5 mg  1 patch Transdermal Q72H Duane Boston, MD       Allergies:  Allergies  Allergen Reactions  . Penicillins Rash  . Sulfa Antibiotics Rash    Family History  Problem Relation Age of Onset  . Cancer Mother        breast  . Hypertension Mother   . Hyperlipidemia Mother   . Diabetes Father   . Heart disease Father   . Hyperlipidemia Father   . Hypertension Father   . Diabetes Sister   . Hypertension Sister   . Hyperlipidemia Sister    Social History:  reports that she quit smoking about 35 years ago. Her smoking use included cigarettes. She quit after 3.00 years of use. She has never used smokeless tobacco. She reports that she drinks about 1.0 standard drinks of alcohol per week. She reports that she does not use drugs.  Review of Systems  All other systems reviewed and are negative.   Physical Exam:  Vital signs in last 24 hours: Temp:  [98.2 F (36.8 C)] 98.2 F (36.8 C) (08/22 1008) Pulse Rate:  [51] 51 (08/22 1008) Resp:  [16] 16 (08/22 1008) BP: (118)/(76) 118/76 (08/22 1008) SpO2:  [100 %] 100 % (08/22 1008) Weight:  [68.9 kg] 68.9 kg (08/22 1008) Physical Exam  Constitutional: She is oriented to person,  place, and time. She appears well-developed and well-nourished.  HENT:  Head: Normocephalic and atraumatic.  Eyes: Pupils are equal, round, and reactive to light. EOM are normal.  Neck: Normal range of motion. No thyromegaly present.  Cardiovascular: Normal rate and regular rhythm.  Respiratory: Effort normal. No respiratory distress.  GI: Soft. She exhibits no distension.  Musculoskeletal: Normal range of motion. She exhibits no edema.  Neurological: She is alert and oriented to person, place, and time.  Skin: Skin is warm and dry.  Psychiatric: She has a normal mood and affect. Her behavior is normal.  Judgment and thought content normal.    Laboratory Data:  No results found for this or any previous visit (from the past 24 hour(s)). No results found for this or any previous visit (from the past 240 hour(s)). Creatinine: No results for input(s): CREATININE in the last 168 hours. Baseline Creatinine: unknown  Impression/Assessment:  57yo with bladder tumors  Plan:  The risks/benefits/alterantives to TURBT was explained to the patient and she understands and wishes to proceed with surgery  Nicolette Bang 11/29/2017, 12:35 PM

## 2017-11-29 NOTE — Anesthesia Procedure Notes (Signed)
Procedure Name: Intubation Date/Time: 11/29/2017 12:53 PM Performed by: Lieutenant Diego, CRNA Pre-anesthesia Checklist: Patient identified, Emergency Drugs available, Suction available and Patient being monitored Patient Re-evaluated:Patient Re-evaluated prior to induction Oxygen Delivery Method: Circle system utilized Preoxygenation: Pre-oxygenation with 100% oxygen Induction Type: IV induction Ventilation: Mask ventilation without difficulty Laryngoscope Size: Miller and 2 Grade View: Grade I Tube type: Oral Tube size: 7.0 mm Number of attempts: 1 Airway Equipment and Method: Stylet and Oral airway Placement Confirmation: ETT inserted through vocal cords under direct vision,  positive ETCO2 and breath sounds checked- equal and bilateral Secured at: 18 cm Tube secured with: Tape Dental Injury: Teeth and Oropharynx as per pre-operative assessment

## 2017-11-30 ENCOUNTER — Encounter (HOSPITAL_BASED_OUTPATIENT_CLINIC_OR_DEPARTMENT_OTHER): Payer: Self-pay | Admitting: Urology

## 2017-11-30 NOTE — Anesthesia Postprocedure Evaluation (Signed)
Anesthesia Post Note  Patient: Julie Villa  Procedure(s) Performed: TRANSURETHRAL RESECTION OF BLADDER TUMOR (TURBT) (N/A Bladder)     Patient location during evaluation: PACU Anesthesia Type: General Level of consciousness: sedated Pain management: pain level controlled Vital Signs Assessment: post-procedure vital signs reviewed and stable Respiratory status: spontaneous breathing and respiratory function stable Cardiovascular status: stable Postop Assessment: no apparent nausea or vomiting Anesthetic complications: no    Last Vitals:  Vitals:   11/29/17 1515 11/29/17 1555  BP: 124/66 118/68  Pulse: (!) 51 (!) 58  Resp: 16 16  Temp: 36.7 C 37.2 C  SpO2: 96% 100%    Last Pain:  Vitals:   11/29/17 1500  TempSrc:   PainSc: 0-No pain                 Ellesse Antenucci DANIEL

## 2017-12-04 ENCOUNTER — Encounter: Payer: BLUE CROSS/BLUE SHIELD | Admitting: Adult Health

## 2017-12-06 DIAGNOSIS — C672 Malignant neoplasm of lateral wall of bladder: Secondary | ICD-10-CM | POA: Diagnosis not present

## 2017-12-06 NOTE — Op Note (Signed)
.  Preoperative diagnosis: bladder tumor  Postoperative diagnosis: Same  Procedure: 1 cystoscopy 2. Transurethral resection of bladder tumor, medium  Attending: Rosie Fate  Anesthesia: General  Estimated blood loss: Minimal  Drains: 22 French foley  Specimens: bladder tumor  Antibiotics: ancef  Findings: 14 56mm-5mm bladder tumors involving the right lateral wall and posterior wall.  Ureteral orifices in normal anatomic location.   Indications: Patient is a 57 year old female with a history of bladder tumors found on office cystoscopy.  After discussing treatment options, they decided proceed with transurethral resection of a bladder tumor.  Procedure her in detail: The patient was brought to the operating room and a brief timeout was done to ensure correct patient, correct procedure, correct site.  General anesthesia was administered patient was placed in dorsal lithotomy position.  Their genitalia was then prepped and draped in usual sterile fashion.  A rigid 81 French cystoscope was passed in the urethra and the bladder.  Bladder was inspected and we noted numerous 2-72mm bladder tumors.  the ureteral orifices were in the normal orthotopic locations. Using the bipolar resectoscope we removed the bladder tumors down to the base.  Hemostasis was then obtained with electrocautery. We then removed the bladder tumor chips and sent them for pathology. We then re-inspected the bladder and found no residula bleeding.  the bladder was then drained, a 22 French foley was placed and this concluded the procedure which was well tolerated by patient.  Complications: None  Condition: Stable, extubated, transferred to PACU  Plan: Patient is to be discharged home and followup in 5 days for foley catheter removal and pathology discussion.

## 2017-12-11 ENCOUNTER — Telehealth: Payer: Self-pay | Admitting: Adult Health

## 2017-12-11 ENCOUNTER — Encounter: Payer: Self-pay | Admitting: Adult Health

## 2017-12-11 ENCOUNTER — Inpatient Hospital Stay: Payer: BLUE CROSS/BLUE SHIELD | Attending: Adult Health | Admitting: Adult Health

## 2017-12-11 VITALS — BP 114/57 | HR 63 | Temp 97.7°F | Resp 18 | Wt 156.2 lb

## 2017-12-11 DIAGNOSIS — Z923 Personal history of irradiation: Secondary | ICD-10-CM | POA: Diagnosis not present

## 2017-12-11 DIAGNOSIS — Z170 Estrogen receptor positive status [ER+]: Secondary | ICD-10-CM | POA: Diagnosis not present

## 2017-12-11 DIAGNOSIS — C50212 Malignant neoplasm of upper-inner quadrant of left female breast: Secondary | ICD-10-CM | POA: Diagnosis not present

## 2017-12-11 DIAGNOSIS — Z79811 Long term (current) use of aromatase inhibitors: Secondary | ICD-10-CM

## 2017-12-11 DIAGNOSIS — Z87891 Personal history of nicotine dependence: Secondary | ICD-10-CM | POA: Diagnosis not present

## 2017-12-11 NOTE — Progress Notes (Signed)
CLINIC:  Survivorship   REASON FOR VISIT:  Routine follow-up for history of breast cancer.   BRIEF ONCOLOGIC HISTORY:    Carcinoma of upper-inner quadrant of left female breast ([place])   09/21/2010 Surgery    Left breast lumpectomy invasive low grade ductal carcinoma 0.6 cm with low-grade DCIS invasive lobular cancer spent 0.3 cm grade 1; 2 sentinel nodes negative    11/09/2010 - 01/08/2011 Radiation Therapy    Adjuvant radiation (dates were estimated by the patient)    02/01/2011 - 01/2016 Anti-estrogen oral therapy    Tamoxifen 20 mg by mouth daily switched to anastrozole 1 mg daily February 2016 (after she became postmenopausal)     Miscellaneous    BCI testing sent and showed low likelihood of benefit of extending anti-estrogen therapy past 5 years      INTERVAL HISTORY:  Julie Villa presents to the [facility] today for routine follow-up for her history of breast cancer.  Overall, she reports feeling quite well. She is exercising regularly.  She is seeing her PCP regularly.  She has no breast concerns today.      REVIEW OF SYSTEMS:  Review of Systems  Constitutional: Negative for appetite change, chills, fatigue, fever and unexpected weight change.  HENT:   Negative for hearing loss, lump/mass, mouth sores, sore throat and trouble swallowing.   Eyes: Negative for eye problems and icterus.  Respiratory: Negative for chest tightness, cough and shortness of breath.   Cardiovascular: Negative for chest pain, leg swelling and palpitations.  Gastrointestinal: Negative for abdominal distention, abdominal pain, constipation, diarrhea, nausea and vomiting.  Endocrine: Negative for hot flashes.  Skin: Negative for itching and rash.  Neurological: Negative for dizziness, extremity weakness, headaches and numbness.  Hematological: Negative for adenopathy. Does not bruise/bleed easily.  Psychiatric/Behavioral: Negative for depression. The patient is not nervous/anxious.     Breast: Denies any new nodularity, masses, tenderness, nipple changes, or nipple discharge.       PAST MEDICAL/SURGICAL HISTORY:  Past Medical History:  Diagnosis Date  . Anemia    hx of several yrs ago  . Bladder tumor   . Frequency of urination   . Hematuria   . History of left breast cancer dx 2012  --- oncologist-  dr Lindi Adie ([facility])-- per lov note, no recurrence   Left UIQ - Invasive ductial carcinoma, Invasive Lobular carcinoma w/ LCIS; Stage IA (T1b, N0, M0)--- s/p breast lumpectomy w/ snl dissection and radiation therapy completed 01-03-2011  . History of radiation therapy completed 01-03-2011   total 61Gy  , left breast   . History of radiation therapy 2012  . Personal history of radiation therapy   . Wears glasses    Past Surgical History:  Procedure Laterality Date  . APPENDECTOMY  1987  . BREAST LUMPECTOMY Left 2012  . BREAST LUMPECTOMY WITH AXILLARY LYMPH NODE DISSECTION Left 09-21-2010  dr Margot Chimes  [facility]  . CATARACT EXTRACTION W/ INTRAOCULAR LENS IMPLANT Right 06/2016  . CYSTOSCOPY W/ RETROGRADES Bilateral 01/19/2017   Procedure: CYSTOSCOPY WITH RETROGRADE PYELOGRAM;  Surgeon: Cleon Gustin, MD;  Location: [facility];  Service: Urology;  Laterality: Bilateral;  . NASAL SINUS SURGERY  06/2008  . TRANSURETHRAL RESECTION OF BLADDER TUMOR N/A 01/19/2017   Procedure: TRANSURETHRAL RESECTION OF BLADDER TUMOR (TURBT);  Surgeon: Cleon Gustin, MD;  Location: [facility];  Service: Urology;  Laterality: N/A;  . TRANSURETHRAL RESECTION OF BLADDER TUMOR N/A 11/29/2017   Procedure: TRANSURETHRAL RESECTION OF BLADDER  TUMOR (TURBT);  Surgeon: Cleon Gustin, MD;  Location: [facility];  Service: Urology;  Laterality: N/A;     ALLERGIES:  Allergies  Allergen Reactions  . Penicillins Rash  . Sulfa Antibiotics Rash     CURRENT MEDICATIONS:  Outpatient Encounter Medications as of 12/11/2017  Medication  Sig  . Calcium Carbonate-Vitamin D (CALCIUM 600+D3 PO) Take 1 tablet by mouth 2 (two) times daily.  Marland Kitchen CRANBERRY-VITAMIN C PO Take 500 mg by mouth 2 (two) times daily. Cranberry 4200 mg  . Estrogens, Conjugated (PREMARIN VA) Place 0.5 mg vaginally 2 (two) times a week.  . Ferrous Sulfate (IRON) 325 (65 Fe) MG TABS Take 1 tablet by mouth every morning. Is 65 mg tablet  . Magnesium 500 MG TABS Take by mouth at bedtime.  Marland Kitchen oxyCODONE-acetaminophen (ROXICET) 5-325 MG tablet Take 1 tablet by mouth every 6 (six) hours as needed.  . solifenacin (VESICARE) 10 MG tablet Take 10 mg by mouth every morning.   . vitamin C (ASCORBIC ACID) 500 MG tablet Take 500 mg by mouth daily.   . [DISCONTINUED] budesonide (ENTOCORT EC) 3 MG 24 hr capsule Take 3 mg by mouth daily.   No facility-administered encounter medications on file as of 12/11/2017.      ONCOLOGIC FAMILY HISTORY:  Family History  Problem Relation Age of Onset  . Cancer Mother        breast  . Hypertension Mother   . Hyperlipidemia Mother   . Diabetes Father   . Heart disease Father   . Hyperlipidemia Father   . Hypertension Father   . Diabetes Sister   . Hypertension Sister   . Hyperlipidemia Sister       SOCIAL HISTORY:  Social History   Socioeconomic History  . Marital status: Married    Spouse name: Not on file  . Number of children: Not on file  . Years of education: Not on file  . Highest education level: Not on file  Occupational History  . Not on file  Social Needs  . Financial resource strain: Not on file  . Food insecurity:    Worry: Not on file    Inability: Not on file  . Transportation needs:    Medical: Not on file    Non-medical: Not on file  Tobacco Use  . Smoking status: Former Smoker    Years: 3.00    Types: Cigarettes    Last attempt to quit: 01/17/1982    Years since quitting: 35.9  . Smokeless tobacco: Never Used  Substance and Sexual Activity  . Alcohol use: Yes    Alcohol/week: 1.0 standard  drinks    Types: 1 Glasses of wine per week    Comment: glass wine couple times a month  . Drug use: No  . Sexual activity: Yes  Lifestyle  . Physical activity:    Days per week: Not on file    Minutes per session: Not on file  . Stress: Not on file  Relationships  . Social connections:    Talks on phone: Not on file    Gets together: Not on file    Attends religious service: Not on file    Active member of club or organization: Not on file    Attends meetings of clubs or organizations: Not on file    Relationship status: Not on file  . Intimate partner violence:    Fear of current or ex partner: Not on file    Emotionally abused: Not  on file    Physically abused: Not on file    Forced sexual activity: Not on file  Other Topics Concern  . Not on file  Social History Narrative  . Not on file        PHYSICAL EXAMINATION:  Vital Signs: Vitals:   12/11/17 1502  BP: (!) 114/57  Pulse: 63  Resp: 18  Temp: 97.7 F (36.5 C)  SpO2: 100%   Filed Weights   12/11/17 1502  Weight: 156 lb 4 oz (70.9 kg)   General: Well-nourished, well-appearing female in no acute distress.  Unaccompanied today.   HEENT: Head is normocephalic.  Pupils equal and reactive to light. Conjunctivae clear without exudate.  Sclerae anicteric. Oral mucosa is pink, moist.  Oropharynx is pink without lesions or erythema.  Lymph: No cervical, supraclavicular, or infraclavicular lymphadenopathy noted on palpation.  Cardiovascular: Regular rate and rhythm.Marland Kitchen Respiratory: Clear to auscultation bilaterally. Chest expansion symmetric; breathing non-labored.  Breast Exam:  -Left breast: No appreciable masses on palpation. No skin redness, thickening, or peau d'orange appearance; no nipple retraction or nipple discharge; mild distortion in symmetry at previous lumpectomy site well healed scar without erythema or nodularity.  -Right breast: No appreciable masses on palpation. No skin redness, thickening, or peau  d'orange appearance; no nipple retraction or nipple discharge -Axilla: No axillary adenopathy bilaterally.  GI: Abdomen soft and round; non-tender, non-distended. Bowel sounds normoactive. No hepatosplenomegaly.   GU: Deferred.  Neuro: No focal deficits. Steady gait.  Psych: Mood and affect normal and appropriate for situation.  MSK: No focal spinal tenderness to palpation, full range of motion in bilateral upper extremities Extremities: No edema. Skin: Warm and dry.  LABORATORY DATA:  None for this visit   DIAGNOSTIC IMAGING:  Most recent mammogram:      ASSESSMENT AND PLAN:  Ms.. Musco is a pleasant 57 y.o. female with history of Stage IA left breast invasive ductal carcinoma, ER+/PR+/HER2-, diagnosed in 09/2010, treated with lumpectomy, adjuvant radiation therapy, and anti-estrogen therapy with Tamoxifen followed by Anastrozole completed in 01/2016.  [facility] and routine follow-up.   1. History of breast cancer:  Ms. Vantol is currently clinically and radiographically without evidence of disease or recurrence of breast cancer. She will be due for mammogram in 05/2018; orders placed today.  She will return in one year for LTS follow up.  I offered her transition to her PCP for continued surveillance, however she would like to stay here.  I encouraged her to call me with any questions or concerns before her next visit at the cancer center, and I would be happy to see her sooner, if needed.    2. Bone health:  Given Ms. Houseman's age, history of breast cancer, and her current anti-estrogen therapy with Anastrozole, she is at risk for bone demineralization. She was given information about bone health in her AVS, I will defer future bone density testing to her PCP.    3. Cancer screening:  Due to Ms. Scott's history and her age, she should receive screening for skin cancers, colon cancer, and gynecologic cancers. She was encouraged to follow-up with  her PCP for appropriate cancer screenings.   4. Health maintenance and wellness promotion: Ms. Cafaro was encouraged to consume 5-7 servings of fruits and vegetables per day. She was also encouraged to engage in moderate to vigorous exercise for 30 minutes per day most days of the week. She was instructed to limit her alcohol consumption and continue  to abstain from tobacco use.    Dispo:  -Return to cancer center in one year for lts follow up -Mammogram in 05/2018   A total of (30) minutes of face-to-face time was spent with this patient with greater than 50% of that time in counseling and care-coordination.   Gardenia Phlegm, [place] [phone]   Note: PRIMARY CARE PROVIDER Orpah Melter, [place] [phone]

## 2017-12-11 NOTE — Telephone Encounter (Signed)
Gave pt avs and calendar  °

## 2017-12-11 NOTE — Patient Instructions (Signed)
Bone Health Bones protect organs, store calcium, and anchor muscles. Good health habits, such as eating nutritious foods and exercising regularly, are important for maintaining healthy bones. They can also help to prevent a condition that causes bones to lose density and become weak and brittle (osteoporosis). Why is bone mass important? Bone mass refers to the amount of bone tissue that you have. The higher your bone mass, the stronger your bones. An important step toward having healthy bones throughout life is to have strong and dense bones during childhood. A young adult who has a high bone mass is more likely to have a high bone mass later in life. Bone mass at its greatest it is called peak bone mass. A large decline in bone mass occurs in older adults. In women, it occurs about the time of menopause. During this time, it is important to practice good health habits, because if more bone is lost than what is replaced, the bones will become less healthy and more likely to break (fracture). If you find that you have a low bone mass, you may be able to prevent osteoporosis or further bone loss by changing your diet and lifestyle. How can I find out if my bone mass is low? Bone mass can be measured with an X-ray test that is called a bone mineral density (BMD) test. This test is recommended for all women who are age 65 or older. It may also be recommended for men who are age 70 or older, or for people who are more likely to develop osteoporosis due to:  Having bones that break easily.  Having a long-term disease that weakens bones, such as kidney disease or rheumatoid arthritis.  Having menopause earlier than normal.  Taking medicine that weakens bones, such as steroids, thyroid hormones, or hormone treatment for breast cancer or prostate cancer.  Smoking.  Drinking three or more alcoholic drinks each day.  What are the nutritional recommendations for healthy bones? To have healthy bones, you  need to get enough of the right minerals and vitamins. Most nutrition experts recommend getting these nutrients from the foods that you eat. Nutritional recommendations vary from person to person. Ask your health care provider what is healthy for you. Here are some general guidelines. Calcium Recommendations Calcium is the most important (essential) mineral for bone health. Most people can get enough calcium from their diet, but supplements may be recommended for people who are at risk for osteoporosis. Good sources of calcium include:  Dairy products, such as low-fat or nonfat milk, cheese, and yogurt.  Dark green leafy vegetables, such as bok choy and broccoli.  Calcium-fortified foods, such as orange juice, cereal, bread, soy beverages, and tofu products.  Nuts, such as almonds.  Follow these recommended amounts for daily calcium intake:  Children, age 1?3: 700 mg.  Children, age 4?8: 1,000 mg.  Children, age 9?13: 1,300 mg.  Teens, age 14?18: 1,300 mg.  Adults, age 19?50: 1,000 mg.  Adults, age 51?70: ? Men: 1,000 mg. ? Women: 1,200 mg.  Adults, age 71 or older: 1,200 mg.  Pregnant and breastfeeding females: ? Teens: 1,300 mg. ? Adults: 1,000 mg.  Vitamin D Recommendations Vitamin D is the most essential vitamin for bone health. It helps the body to absorb calcium. Sunlight stimulates the skin to make vitamin D, so be sure to get enough sunlight. If you live in a cold climate or you do not get outside often, your health care provider may recommend that you take vitamin   D supplements. Good sources of vitamin D in your diet include:  Egg yolks.  Saltwater fish.  Milk and cereal fortified with vitamin D.  Follow these recommended amounts for daily vitamin D intake:  Children and teens, age 1?18: 600 international units.  Adults, age 50 or younger: 400-800 international units.  Adults, age 51 or older: 800-1,000 international units.  Other Nutrients Other nutrients  for bone health include:  Phosphorus. This mineral is found in meat, poultry, dairy foods, nuts, and legumes. The recommended daily intake for adult men and adult women is 700 mg.  Magnesium. This mineral is found in seeds, nuts, dark green vegetables, and legumes. The recommended daily intake for adult men is 400?420 mg. For adult women, it is 310?320 mg.  Vitamin K. This vitamin is found in green leafy vegetables. The recommended daily intake is 120 mg for adult men and 90 mg for adult women.  What type of physical activity is best for building and maintaining healthy bones? Weight-bearing and strength-building activities are important for building and maintaining peak bone mass. Weight-bearing activities cause muscles and bones to work against gravity. Strength-building activities increases muscle strength that supports bones. Weight-bearing and muscle-building activities include:  Walking and hiking.  Jogging and running.  Dancing.  Gym exercises.  Lifting weights.  Tennis and racquetball.  Climbing stairs.  Aerobics.  Adults should get at least 30 minutes of moderate physical activity on most days. Children should get at least 60 minutes of moderate physical activity on most days. Ask your health care provide what type of exercise is best for you. Where can I find more information? For more information, check out the following websites:  National Osteoporosis Foundation: http://nof.org/learn/basics  National Institutes of Health: http://www.niams.nih.gov/Health_Info/Bone/Bone_Health/bone_health_for_life.asp  This information is not intended to replace advice given to you by your health care provider. Make sure you discuss any questions you have with your health care provider. Document Released: 06/17/2003 Document Revised: 10/15/2015 Document Reviewed: 04/01/2014 [employer]vier Interactive Patient Education  2018 [employer]  

## 2017-12-12 DIAGNOSIS — B961 Klebsiella pneumoniae [K. pneumoniae] as the cause of diseases classified elsewhere: Secondary | ICD-10-CM | POA: Diagnosis not present

## 2017-12-12 DIAGNOSIS — C672 Malignant neoplasm of lateral wall of bladder: Secondary | ICD-10-CM | POA: Diagnosis not present

## 2017-12-12 DIAGNOSIS — N390 Urinary tract infection, site not specified: Secondary | ICD-10-CM | POA: Diagnosis not present

## 2017-12-12 DIAGNOSIS — R350 Frequency of micturition: Secondary | ICD-10-CM | POA: Diagnosis not present

## 2017-12-19 DIAGNOSIS — M9903 Segmental and somatic dysfunction of lumbar region: Secondary | ICD-10-CM | POA: Diagnosis not present

## 2017-12-19 DIAGNOSIS — M9904 Segmental and somatic dysfunction of sacral region: Secondary | ICD-10-CM | POA: Diagnosis not present

## 2017-12-19 DIAGNOSIS — M5136 Other intervertebral disc degeneration, lumbar region: Secondary | ICD-10-CM | POA: Diagnosis not present

## 2017-12-19 DIAGNOSIS — M9902 Segmental and somatic dysfunction of thoracic region: Secondary | ICD-10-CM | POA: Diagnosis not present

## 2018-01-16 DIAGNOSIS — B9689 Other specified bacterial agents as the cause of diseases classified elsewhere: Secondary | ICD-10-CM | POA: Diagnosis not present

## 2018-01-16 DIAGNOSIS — N390 Urinary tract infection, site not specified: Secondary | ICD-10-CM | POA: Diagnosis not present

## 2018-01-16 DIAGNOSIS — N3000 Acute cystitis without hematuria: Secondary | ICD-10-CM | POA: Diagnosis not present

## 2018-01-17 DIAGNOSIS — M9903 Segmental and somatic dysfunction of lumbar region: Secondary | ICD-10-CM | POA: Diagnosis not present

## 2018-01-17 DIAGNOSIS — M9902 Segmental and somatic dysfunction of thoracic region: Secondary | ICD-10-CM | POA: Diagnosis not present

## 2018-01-17 DIAGNOSIS — M5136 Other intervertebral disc degeneration, lumbar region: Secondary | ICD-10-CM | POA: Diagnosis not present

## 2018-01-17 DIAGNOSIS — M9904 Segmental and somatic dysfunction of sacral region: Secondary | ICD-10-CM | POA: Diagnosis not present

## 2018-01-23 DIAGNOSIS — Z5111 Encounter for antineoplastic chemotherapy: Secondary | ICD-10-CM | POA: Diagnosis not present

## 2018-01-23 DIAGNOSIS — C672 Malignant neoplasm of lateral wall of bladder: Secondary | ICD-10-CM | POA: Diagnosis not present

## 2018-01-30 DIAGNOSIS — C672 Malignant neoplasm of lateral wall of bladder: Secondary | ICD-10-CM | POA: Diagnosis not present

## 2018-01-30 DIAGNOSIS — Z5111 Encounter for antineoplastic chemotherapy: Secondary | ICD-10-CM | POA: Diagnosis not present

## 2018-02-06 DIAGNOSIS — C672 Malignant neoplasm of lateral wall of bladder: Secondary | ICD-10-CM | POA: Diagnosis not present

## 2018-02-06 DIAGNOSIS — N390 Urinary tract infection, site not specified: Secondary | ICD-10-CM | POA: Diagnosis not present

## 2018-02-06 DIAGNOSIS — B9620 Unspecified Escherichia coli [E. coli] as the cause of diseases classified elsewhere: Secondary | ICD-10-CM | POA: Diagnosis not present

## 2018-02-13 DIAGNOSIS — C672 Malignant neoplasm of lateral wall of bladder: Secondary | ICD-10-CM | POA: Diagnosis not present

## 2018-02-14 DIAGNOSIS — M9903 Segmental and somatic dysfunction of lumbar region: Secondary | ICD-10-CM | POA: Diagnosis not present

## 2018-02-14 DIAGNOSIS — M5136 Other intervertebral disc degeneration, lumbar region: Secondary | ICD-10-CM | POA: Diagnosis not present

## 2018-02-14 DIAGNOSIS — M9904 Segmental and somatic dysfunction of sacral region: Secondary | ICD-10-CM | POA: Diagnosis not present

## 2018-02-14 DIAGNOSIS — M9902 Segmental and somatic dysfunction of thoracic region: Secondary | ICD-10-CM | POA: Diagnosis not present

## 2018-02-20 DIAGNOSIS — C672 Malignant neoplasm of lateral wall of bladder: Secondary | ICD-10-CM | POA: Diagnosis not present

## 2018-02-20 DIAGNOSIS — Z5111 Encounter for antineoplastic chemotherapy: Secondary | ICD-10-CM | POA: Diagnosis not present

## 2018-02-25 DIAGNOSIS — B961 Klebsiella pneumoniae [K. pneumoniae] as the cause of diseases classified elsewhere: Secondary | ICD-10-CM | POA: Diagnosis not present

## 2018-02-25 DIAGNOSIS — N390 Urinary tract infection, site not specified: Secondary | ICD-10-CM | POA: Diagnosis not present

## 2018-02-27 DIAGNOSIS — B961 Klebsiella pneumoniae [K. pneumoniae] as the cause of diseases classified elsewhere: Secondary | ICD-10-CM | POA: Diagnosis not present

## 2018-02-27 DIAGNOSIS — N390 Urinary tract infection, site not specified: Secondary | ICD-10-CM | POA: Diagnosis not present

## 2018-02-27 DIAGNOSIS — C672 Malignant neoplasm of lateral wall of bladder: Secondary | ICD-10-CM | POA: Diagnosis not present

## 2018-03-06 DIAGNOSIS — Z5111 Encounter for antineoplastic chemotherapy: Secondary | ICD-10-CM | POA: Diagnosis not present

## 2018-03-06 DIAGNOSIS — C672 Malignant neoplasm of lateral wall of bladder: Secondary | ICD-10-CM | POA: Diagnosis not present

## 2018-03-13 DIAGNOSIS — Z5111 Encounter for antineoplastic chemotherapy: Secondary | ICD-10-CM | POA: Diagnosis not present

## 2018-03-13 DIAGNOSIS — C672 Malignant neoplasm of lateral wall of bladder: Secondary | ICD-10-CM | POA: Diagnosis not present

## 2018-03-14 DIAGNOSIS — M9902 Segmental and somatic dysfunction of thoracic region: Secondary | ICD-10-CM | POA: Diagnosis not present

## 2018-03-14 DIAGNOSIS — M9904 Segmental and somatic dysfunction of sacral region: Secondary | ICD-10-CM | POA: Diagnosis not present

## 2018-03-14 DIAGNOSIS — M5136 Other intervertebral disc degeneration, lumbar region: Secondary | ICD-10-CM | POA: Diagnosis not present

## 2018-03-14 DIAGNOSIS — M9903 Segmental and somatic dysfunction of lumbar region: Secondary | ICD-10-CM | POA: Diagnosis not present

## 2018-04-17 DIAGNOSIS — M5136 Other intervertebral disc degeneration, lumbar region: Secondary | ICD-10-CM | POA: Diagnosis not present

## 2018-04-17 DIAGNOSIS — M9904 Segmental and somatic dysfunction of sacral region: Secondary | ICD-10-CM | POA: Diagnosis not present

## 2018-04-17 DIAGNOSIS — M9902 Segmental and somatic dysfunction of thoracic region: Secondary | ICD-10-CM | POA: Diagnosis not present

## 2018-04-17 DIAGNOSIS — M9903 Segmental and somatic dysfunction of lumbar region: Secondary | ICD-10-CM | POA: Diagnosis not present

## 2018-04-22 DIAGNOSIS — C672 Malignant neoplasm of lateral wall of bladder: Secondary | ICD-10-CM | POA: Diagnosis not present

## 2018-05-02 ENCOUNTER — Other Ambulatory Visit: Payer: Self-pay | Admitting: Hematology and Oncology

## 2018-05-02 DIAGNOSIS — Z1231 Encounter for screening mammogram for malignant neoplasm of breast: Secondary | ICD-10-CM

## 2018-05-15 DIAGNOSIS — M9904 Segmental and somatic dysfunction of sacral region: Secondary | ICD-10-CM | POA: Diagnosis not present

## 2018-05-15 DIAGNOSIS — M9903 Segmental and somatic dysfunction of lumbar region: Secondary | ICD-10-CM | POA: Diagnosis not present

## 2018-05-15 DIAGNOSIS — M9902 Segmental and somatic dysfunction of thoracic region: Secondary | ICD-10-CM | POA: Diagnosis not present

## 2018-05-15 DIAGNOSIS — M5136 Other intervertebral disc degeneration, lumbar region: Secondary | ICD-10-CM | POA: Diagnosis not present

## 2018-05-27 ENCOUNTER — Ambulatory Visit
Admission: RE | Admit: 2018-05-27 | Discharge: 2018-05-27 | Disposition: A | Discharge status: Home / Self Care | Payer: BLUE CROSS/BLUE SHIELD | Source: Ambulatory Visit | Attending: Hematology and Oncology | Admitting: Hematology and Oncology

## 2018-05-27 DIAGNOSIS — Z1231 Encounter for screening mammogram for malignant neoplasm of breast: Secondary | ICD-10-CM

## 2018-05-27 HISTORY — DX: Malignant neoplasm of unspecified site of unspecified female breast: C50.919

## 2018-05-27 HISTORY — DX: Personal history of antineoplastic chemotherapy: Z92.21

## 2018-06-12 DIAGNOSIS — M9902 Segmental and somatic dysfunction of thoracic region: Secondary | ICD-10-CM | POA: Diagnosis not present

## 2018-06-12 DIAGNOSIS — M5136 Other intervertebral disc degeneration, lumbar region: Secondary | ICD-10-CM | POA: Diagnosis not present

## 2018-06-12 DIAGNOSIS — M9903 Segmental and somatic dysfunction of lumbar region: Secondary | ICD-10-CM | POA: Diagnosis not present

## 2018-06-12 DIAGNOSIS — M9904 Segmental and somatic dysfunction of sacral region: Secondary | ICD-10-CM | POA: Diagnosis not present

## 2018-07-10 DIAGNOSIS — M9904 Segmental and somatic dysfunction of sacral region: Secondary | ICD-10-CM | POA: Diagnosis not present

## 2018-07-10 DIAGNOSIS — M9902 Segmental and somatic dysfunction of thoracic region: Secondary | ICD-10-CM | POA: Diagnosis not present

## 2018-07-10 DIAGNOSIS — M9903 Segmental and somatic dysfunction of lumbar region: Secondary | ICD-10-CM | POA: Diagnosis not present

## 2018-07-10 DIAGNOSIS — M5136 Other intervertebral disc degeneration, lumbar region: Secondary | ICD-10-CM | POA: Diagnosis not present

## 2018-07-25 DIAGNOSIS — C672 Malignant neoplasm of lateral wall of bladder: Secondary | ICD-10-CM | POA: Diagnosis not present

## 2018-08-14 DIAGNOSIS — M9903 Segmental and somatic dysfunction of lumbar region: Secondary | ICD-10-CM | POA: Diagnosis not present

## 2018-08-14 DIAGNOSIS — M9904 Segmental and somatic dysfunction of sacral region: Secondary | ICD-10-CM | POA: Diagnosis not present

## 2018-08-14 DIAGNOSIS — M5136 Other intervertebral disc degeneration, lumbar region: Secondary | ICD-10-CM | POA: Diagnosis not present

## 2018-08-14 DIAGNOSIS — M9902 Segmental and somatic dysfunction of thoracic region: Secondary | ICD-10-CM | POA: Diagnosis not present

## 2018-09-11 DIAGNOSIS — M9902 Segmental and somatic dysfunction of thoracic region: Secondary | ICD-10-CM | POA: Diagnosis not present

## 2018-09-11 DIAGNOSIS — M9904 Segmental and somatic dysfunction of sacral region: Secondary | ICD-10-CM | POA: Diagnosis not present

## 2018-09-11 DIAGNOSIS — M9903 Segmental and somatic dysfunction of lumbar region: Secondary | ICD-10-CM | POA: Diagnosis not present

## 2018-09-11 DIAGNOSIS — M5136 Other intervertebral disc degeneration, lumbar region: Secondary | ICD-10-CM | POA: Diagnosis not present

## 2018-09-18 DIAGNOSIS — Z0000 Encounter for general adult medical examination without abnormal findings: Secondary | ICD-10-CM | POA: Diagnosis not present

## 2018-09-18 DIAGNOSIS — Z23 Encounter for immunization: Secondary | ICD-10-CM | POA: Diagnosis not present

## 2018-09-18 DIAGNOSIS — Z131 Encounter for screening for diabetes mellitus: Secondary | ICD-10-CM | POA: Diagnosis not present

## 2018-09-18 DIAGNOSIS — Z13220 Encounter for screening for lipoid disorders: Secondary | ICD-10-CM | POA: Diagnosis not present

## 2018-10-09 DIAGNOSIS — M9903 Segmental and somatic dysfunction of lumbar region: Secondary | ICD-10-CM | POA: Diagnosis not present

## 2018-10-09 DIAGNOSIS — M5136 Other intervertebral disc degeneration, lumbar region: Secondary | ICD-10-CM | POA: Diagnosis not present

## 2018-10-09 DIAGNOSIS — M9904 Segmental and somatic dysfunction of sacral region: Secondary | ICD-10-CM | POA: Diagnosis not present

## 2018-10-09 DIAGNOSIS — M9902 Segmental and somatic dysfunction of thoracic region: Secondary | ICD-10-CM | POA: Diagnosis not present

## 2018-10-29 DIAGNOSIS — C672 Malignant neoplasm of lateral wall of bladder: Secondary | ICD-10-CM | POA: Diagnosis not present

## 2018-11-06 DIAGNOSIS — M9903 Segmental and somatic dysfunction of lumbar region: Secondary | ICD-10-CM | POA: Diagnosis not present

## 2018-11-06 DIAGNOSIS — M9904 Segmental and somatic dysfunction of sacral region: Secondary | ICD-10-CM | POA: Diagnosis not present

## 2018-11-06 DIAGNOSIS — M5136 Other intervertebral disc degeneration, lumbar region: Secondary | ICD-10-CM | POA: Diagnosis not present

## 2018-11-06 DIAGNOSIS — M9902 Segmental and somatic dysfunction of thoracic region: Secondary | ICD-10-CM | POA: Diagnosis not present

## 2018-12-05 DIAGNOSIS — M9904 Segmental and somatic dysfunction of sacral region: Secondary | ICD-10-CM | POA: Diagnosis not present

## 2018-12-05 DIAGNOSIS — M9902 Segmental and somatic dysfunction of thoracic region: Secondary | ICD-10-CM | POA: Diagnosis not present

## 2018-12-05 DIAGNOSIS — M5136 Other intervertebral disc degeneration, lumbar region: Secondary | ICD-10-CM | POA: Diagnosis not present

## 2018-12-05 DIAGNOSIS — M9903 Segmental and somatic dysfunction of lumbar region: Secondary | ICD-10-CM | POA: Diagnosis not present

## 2018-12-10 ENCOUNTER — Other Ambulatory Visit: Payer: Self-pay

## 2018-12-10 ENCOUNTER — Inpatient Hospital Stay: Payer: BC Managed Care – PPO | Attending: Adult Health | Admitting: Adult Health

## 2018-12-10 VITALS — BP 113/66 | HR 62 | Temp 98.9°F | Resp 18 | Wt 156.6 lb

## 2018-12-10 DIAGNOSIS — Z7289 Other problems related to lifestyle: Secondary | ICD-10-CM | POA: Diagnosis not present

## 2018-12-10 DIAGNOSIS — Z79811 Long term (current) use of aromatase inhibitors: Secondary | ICD-10-CM | POA: Insufficient documentation

## 2018-12-10 DIAGNOSIS — Z8249 Family history of ischemic heart disease and other diseases of the circulatory system: Secondary | ICD-10-CM | POA: Diagnosis not present

## 2018-12-10 DIAGNOSIS — Z880 Allergy status to penicillin: Secondary | ICD-10-CM | POA: Insufficient documentation

## 2018-12-10 DIAGNOSIS — Z882 Allergy status to sulfonamides status: Secondary | ICD-10-CM | POA: Diagnosis not present

## 2018-12-10 DIAGNOSIS — Z79899 Other long term (current) drug therapy: Secondary | ICD-10-CM | POA: Diagnosis not present

## 2018-12-10 DIAGNOSIS — Z83438 Family history of other disorder of lipoprotein metabolism and other lipidemia: Secondary | ICD-10-CM | POA: Insufficient documentation

## 2018-12-10 DIAGNOSIS — C50212 Malignant neoplasm of upper-inner quadrant of left female breast: Secondary | ICD-10-CM | POA: Insufficient documentation

## 2018-12-10 DIAGNOSIS — Z9221 Personal history of antineoplastic chemotherapy: Secondary | ICD-10-CM | POA: Diagnosis not present

## 2018-12-10 DIAGNOSIS — Z803 Family history of malignant neoplasm of breast: Secondary | ICD-10-CM | POA: Insufficient documentation

## 2018-12-10 DIAGNOSIS — Z833 Family history of diabetes mellitus: Secondary | ICD-10-CM | POA: Diagnosis not present

## 2018-12-10 DIAGNOSIS — Z170 Estrogen receptor positive status [ER+]: Secondary | ICD-10-CM | POA: Diagnosis not present

## 2018-12-10 DIAGNOSIS — Z87891 Personal history of nicotine dependence: Secondary | ICD-10-CM | POA: Insufficient documentation

## 2018-12-10 DIAGNOSIS — Z923 Personal history of irradiation: Secondary | ICD-10-CM | POA: Diagnosis not present

## 2018-12-10 NOTE — Progress Notes (Signed)
CLINIC:  Survivorship   REASON FOR VISIT:  Routine follow-up for history of breast cancer.   BRIEF ONCOLOGIC HISTORY:  Oncology History  Carcinoma of upper-inner quadrant of left female breast ([place])  09/21/2010 Surgery   Left breast lumpectomy invasive low grade ductal carcinoma 0.6 cm with low-grade DCIS invasive lobular cancer spent 0.3 cm grade 1; 2 sentinel nodes negative   11/09/2010 - 01/08/2011 Radiation Therapy   Adjuvant radiation (dates were estimated by the patient)   02/01/2011 - 01/2016 Anti-estrogen oral therapy   Tamoxifen 20 mg by mouth daily switched to anastrozole 1 mg daily February 2016 (after she became postmenopausal)    Miscellaneous   BCI testing sent and showed low likelihood of benefit of extending anti-estrogen therapy past 5 years      INTERVAL HISTORY:  Julie Villa presents to the [facility] today for routine follow-up for her history of breast cancer.  Overall, she reports feeling quite well.  She notes that she is not working, but volunteering with [employer] weekly.  She is practicing proper pandemic precautions, and has continued to exercise as well, though not as regular as prior.  She is up to date with her cancer screenings and is seeing her PCP regularly.    Since her last visit, she underwent bilateral screening mammogram that showed no evidence of malignancy and breast density category B.    REVIEW OF SYSTEMS:  Review of Systems  Constitutional: Negative for appetite change, chills, fatigue, fever and unexpected weight change.  HENT:   Negative for hearing loss, lump/mass, mouth sores, sore throat and trouble swallowing.   Eyes: Negative for eye problems and icterus.  Respiratory: Negative for chest tightness, cough and shortness of breath.   Cardiovascular: Negative for chest pain, leg swelling and palpitations.  Gastrointestinal: Negative for abdominal distention, abdominal pain, constipation, diarrhea, nausea and vomiting.   Endocrine: Negative for hot flashes.  Skin: Negative for itching and rash.  Neurological: Negative for dizziness, extremity weakness, headaches and numbness.  Hematological: Negative for adenopathy. Does not bruise/bleed easily.  Psychiatric/Behavioral: Negative for depression. The patient is not nervous/anxious.   Breast: Denies any new nodularity, masses, tenderness, nipple changes, or nipple discharge.       PAST MEDICAL/SURGICAL HISTORY:  Past Medical History:  Diagnosis Date  . Anemia    hx of several yrs ago  . Bladder tumor   . Breast cancer ([place]) 2012   left  . Frequency of urination   . Hematuria   . History of left breast cancer dx 2012  --- oncologist-  dr Lindi Adie ([facility])-- per lov note, no recurrence   Left UIQ - Invasive ductial carcinoma, Invasive Lobular carcinoma w/ LCIS; Stage IA (T1b, N0, M0)--- s/p breast lumpectomy w/ snl dissection and radiation therapy completed 01-03-2011  . History of radiation therapy completed 01-03-2011   total 61Gy  , left breast   . History of radiation therapy 2012  . Personal history of chemotherapy   . Personal history of radiation therapy   . Wears glasses    Past Surgical History:  Procedure Laterality Date  . APPENDECTOMY  1987  . BREAST LUMPECTOMY Left 2012  . BREAST LUMPECTOMY WITH AXILLARY LYMPH NODE DISSECTION Left 09-21-2010  dr Margot Chimes  [facility]  . CATARACT EXTRACTION W/ INTRAOCULAR LENS IMPLANT Right 06/2016  . CYSTOSCOPY W/ RETROGRADES Bilateral 01/19/2017   Procedure: CYSTOSCOPY WITH RETROGRADE PYELOGRAM;  Surgeon: Cleon Gustin, MD;  Location: [facility];  Service: Urology;  Laterality: Bilateral;  . NASAL SINUS SURGERY  06/2008  . TRANSURETHRAL RESECTION OF BLADDER TUMOR N/A 01/19/2017   Procedure: TRANSURETHRAL RESECTION OF BLADDER TUMOR (TURBT);  Surgeon: Cleon Gustin, MD;  Location: [facility];  Service: Urology;  Laterality: N/A;  . TRANSURETHRAL RESECTION  OF BLADDER TUMOR N/A 11/29/2017   Procedure: TRANSURETHRAL RESECTION OF BLADDER TUMOR (TURBT);  Surgeon: Cleon Gustin, MD;  Location: [facility];  Service: Urology;  Laterality: N/A;     ALLERGIES:  Allergies  Allergen Reactions  . Penicillins Rash  . Sulfa Antibiotics Rash     CURRENT MEDICATIONS:  Outpatient Encounter Medications as of 12/10/2018  Medication Sig  . budesonide (ENTOCORT EC) 3 MG 24 hr capsule PLEASE SEE ATTACHED FOR DETAILED DIRECTIONS  . Calcium Carbonate-Vitamin D (CALCIUM 600+D3 PO) Take 1 tablet by mouth 2 (two) times daily.  Marland Kitchen CRANBERRY-VITAMIN C PO Take 500 mg by mouth 2 (two) times daily. Cranberry 4200 mg  . Ferrous Sulfate (IRON) 325 (65 Fe) MG TABS Take 1 tablet by mouth every morning. Is 65 mg tablet  . Magnesium 500 MG TABS Take by mouth at bedtime.  . solifenacin (VESICARE) 10 MG tablet Take 10 mg by mouth every morning.   . vitamin C (ASCORBIC ACID) 500 MG tablet Take 500 mg by mouth daily.   . [DISCONTINUED] Estrogens, Conjugated (PREMARIN VA) Place 0.5 mg vaginally 2 (two) times a week.   No facility-administered encounter medications on file as of 12/10/2018.      ONCOLOGIC FAMILY HISTORY:  Family History  Problem Relation Age of Onset  . Cancer Mother        breast  . Hypertension Mother   . Hyperlipidemia Mother   . Diabetes Father   . Heart disease Father   . Hyperlipidemia Father   . Hypertension Father   . Diabetes Sister   . Hypertension Sister   . Hyperlipidemia Sister       SOCIAL HISTORY:  Social History   Socioeconomic History  . Marital status: Married    Spouse name: Not on file  . Number of children: Not on file  . Years of education: Not on file  . Highest education level: Not on file  Occupational History  . Not on file  Social Needs  . Financial resource strain: Not on file  . Food insecurity    Worry: Not on file    Inability: Not on file  . Transportation needs    Medical: Not on  file    Non-medical: Not on file  Tobacco Use  . Smoking status: Former Smoker    Years: 3.00    Types: Cigarettes    Quit date: 01/17/1982    Years since quitting: 36.9  . Smokeless tobacco: Never Used  Substance and Sexual Activity  . Alcohol use: Yes    Alcohol/week: 1.0 standard drinks    Types: 1 Glasses of wine per week    Comment: glass wine couple times a month  . Drug use: No  . Sexual activity: Yes  Lifestyle  . Physical activity    Days per week: Not on file    Minutes per session: Not on file  . Stress: Not on file  Relationships  . Social Herbalist on phone: Not on file    Gets together: Not on file    Attends religious service: Not on file    Active member of club or organization: Not on file  Attends meetings of clubs or organizations: Not on file    Relationship status: Not on file  . Intimate partner violence    Fear of current or ex partner: Not on file    Emotionally abused: Not on file    Physically abused: Not on file    Forced sexual activity: Not on file  Other Topics Concern  . Not on file  Social History Narrative  . Not on file        PHYSICAL EXAMINATION:  Vital Signs: Vitals:   12/10/18 1511  BP: 113/66  Pulse: 62  Resp: 18  Temp: 98.9 F (37.2 C)  SpO2: 100%   Filed Weights   12/10/18 1511  Weight: 156 lb 9.6 oz (71 kg)   General: Well-nourished, well-appearing female in no acute distress.  Unaccompanied today.   HEENT: Head is normocephalic.  Pupils equal and reactive to light. Conjunctivae clear without exudate.  Sclerae anicteric. Oral mucosa is pink, moist.  Oropharynx is pink without lesions or erythema.  Lymph: No cervical, supraclavicular, or infraclavicular lymphadenopathy noted on palpation.  Cardiovascular: Regular rate and rhythm.Marland Kitchen Respiratory: Clear to auscultation bilaterally. Chest expansion symmetric; breathing non-labored.  Breast Exam:  -Left breast: No appreciable masses on palpation. No skin  redness, thickening, or peau d'orange appearance; no nipple retraction or nipple discharge; mild distortion in symmetry at previous lumpectomy site well healed scar without erythema or nodularity.  -Right breast: No appreciable masses on palpation. No skin redness, thickening, or peau d'orange appearance; no nipple retraction or nipple discharge -Axilla: No axillary adenopathy bilaterally.  GI: Abdomen soft and round; non-tender, non-distended. Bowel sounds normoactive. No hepatosplenomegaly.   GU: Deferred.  Neuro: No focal deficits. Steady gait.  Psych: Mood and affect normal and appropriate for situation.  MSK: No focal spinal tenderness to palpation, full range of motion in bilateral upper extremities Extremities: No edema. Skin: Warm and dry.  LABORATORY DATA:  None for this visit   DIAGNOSTIC IMAGING:  Most recent mammogram:      ASSESSMENT AND PLAN:  Ms.. Villa is a pleasant 58 y.o. female with history of Stage IA left breast invasive ductal carcinoma, ER+/PR+/HER2-, diagnosed in 09/2010, treated with lumpectomy, adjuvant radiation therapy, and anti-estrogen therapy with Tamoxifen followed by Anastrozole completed in 01/2016.  [facility] and routine follow-up.   1. History of breast cancer:  Julie Villa is currently clinically and radiographically without evidence of disease or recurrence of breast cancer. She will be due for mammogram in 05/2019; orders placed today.  She will return in one year for LTS follow up.   I encouraged her to call me with any questions or concerns before her next visit at the cancer center, and I would be happy to see her sooner, if needed.    2. Bone health:  Given Julie Villa age, history of breast cancer, and her current anti-estrogen therapy with Anastrozole, she is at risk for bone demineralization.  We discussed calcium, vitamin d and weight bearing exercises today.  I recommended she f/u with her PCP about  future bone density testing.  3. Cancer screening:  Due to Julie Villa history and her age, she should receive screening for skin cancers, colon cancer, and gynecologic cancers.  She is up to date with these. She was encouraged to follow-up with her PCP for appropriate cancer screenings.   4. Health maintenance and wellness promotion: Julie Villa was encouraged to consume 5-7 servings of fruits and vegetables per  day. She was also encouraged to engage in moderate to vigorous exercise for 30 minutes per day most days of the week. She was instructed to limit her alcohol consumption and continue to abstain from tobacco use.    Dispo:  -Return to cancer center in one year for lts follow up -Mammogram in 05/2019 -She was recommended to continue with the appropriate pandemic precautions.     A total of (20) minutes of face-to-face time was spent with this patient with greater than 50% of that time in counseling and care-coordination.   Gardenia Phlegm, [place] [phone]   Note: PRIMARY CARE PROVIDER Orpah Melter, [place] [phone]

## 2018-12-11 ENCOUNTER — Telehealth: Payer: Self-pay | Admitting: Adult Health

## 2018-12-11 NOTE — Telephone Encounter (Signed)
I talk with patient regarding schedule  

## 2018-12-12 ENCOUNTER — Encounter: Payer: Self-pay | Admitting: Adult Health

## 2019-01-02 DIAGNOSIS — M9904 Segmental and somatic dysfunction of sacral region: Secondary | ICD-10-CM | POA: Diagnosis not present

## 2019-01-02 DIAGNOSIS — M9903 Segmental and somatic dysfunction of lumbar region: Secondary | ICD-10-CM | POA: Diagnosis not present

## 2019-01-02 DIAGNOSIS — M5136 Other intervertebral disc degeneration, lumbar region: Secondary | ICD-10-CM | POA: Diagnosis not present

## 2019-01-02 DIAGNOSIS — M9902 Segmental and somatic dysfunction of thoracic region: Secondary | ICD-10-CM | POA: Diagnosis not present

## 2019-02-06 DIAGNOSIS — M9903 Segmental and somatic dysfunction of lumbar region: Secondary | ICD-10-CM | POA: Diagnosis not present

## 2019-02-06 DIAGNOSIS — M9904 Segmental and somatic dysfunction of sacral region: Secondary | ICD-10-CM | POA: Diagnosis not present

## 2019-02-06 DIAGNOSIS — C672 Malignant neoplasm of lateral wall of bladder: Secondary | ICD-10-CM | POA: Diagnosis not present

## 2019-02-06 DIAGNOSIS — M9902 Segmental and somatic dysfunction of thoracic region: Secondary | ICD-10-CM | POA: Diagnosis not present

## 2019-02-06 DIAGNOSIS — M5136 Other intervertebral disc degeneration, lumbar region: Secondary | ICD-10-CM | POA: Diagnosis not present

## 2019-02-10 DIAGNOSIS — K52831 Collagenous colitis: Secondary | ICD-10-CM | POA: Diagnosis not present

## 2019-03-12 DIAGNOSIS — M9902 Segmental and somatic dysfunction of thoracic region: Secondary | ICD-10-CM | POA: Diagnosis not present

## 2019-03-12 DIAGNOSIS — M9904 Segmental and somatic dysfunction of sacral region: Secondary | ICD-10-CM | POA: Diagnosis not present

## 2019-03-12 DIAGNOSIS — M5136 Other intervertebral disc degeneration, lumbar region: Secondary | ICD-10-CM | POA: Diagnosis not present

## 2019-03-12 DIAGNOSIS — M9903 Segmental and somatic dysfunction of lumbar region: Secondary | ICD-10-CM | POA: Diagnosis not present

## 2019-03-16 DIAGNOSIS — Z23 Encounter for immunization: Secondary | ICD-10-CM | POA: Diagnosis not present

## 2019-03-16 DIAGNOSIS — N3001 Acute cystitis with hematuria: Secondary | ICD-10-CM | POA: Diagnosis not present

## 2019-03-18 DIAGNOSIS — K52831 Collagenous colitis: Secondary | ICD-10-CM | POA: Diagnosis not present

## 2019-04-09 DIAGNOSIS — M9902 Segmental and somatic dysfunction of thoracic region: Secondary | ICD-10-CM | POA: Diagnosis not present

## 2019-04-09 DIAGNOSIS — M9903 Segmental and somatic dysfunction of lumbar region: Secondary | ICD-10-CM | POA: Diagnosis not present

## 2019-04-09 DIAGNOSIS — M5136 Other intervertebral disc degeneration, lumbar region: Secondary | ICD-10-CM | POA: Diagnosis not present

## 2019-04-09 DIAGNOSIS — M9904 Segmental and somatic dysfunction of sacral region: Secondary | ICD-10-CM | POA: Diagnosis not present

## 2019-04-12 DIAGNOSIS — N3001 Acute cystitis with hematuria: Secondary | ICD-10-CM | POA: Diagnosis not present

## 2019-04-24 DIAGNOSIS — N3020 Other chronic cystitis without hematuria: Secondary | ICD-10-CM | POA: Diagnosis not present

## 2019-04-24 DIAGNOSIS — C672 Malignant neoplasm of lateral wall of bladder: Secondary | ICD-10-CM | POA: Diagnosis not present

## 2019-04-29 ENCOUNTER — Other Ambulatory Visit: Payer: Self-pay | Admitting: Diagnostic Radiology

## 2019-04-29 DIAGNOSIS — Z1231 Encounter for screening mammogram for malignant neoplasm of breast: Secondary | ICD-10-CM

## 2019-06-05 ENCOUNTER — Ambulatory Visit: Payer: BC Managed Care – PPO

## 2019-06-25 DIAGNOSIS — M9904 Segmental and somatic dysfunction of sacral region: Secondary | ICD-10-CM | POA: Diagnosis not present

## 2019-06-25 DIAGNOSIS — M9903 Segmental and somatic dysfunction of lumbar region: Secondary | ICD-10-CM | POA: Diagnosis not present

## 2019-06-25 DIAGNOSIS — M5136 Other intervertebral disc degeneration, lumbar region: Secondary | ICD-10-CM | POA: Diagnosis not present

## 2019-06-25 DIAGNOSIS — M9902 Segmental and somatic dysfunction of thoracic region: Secondary | ICD-10-CM | POA: Diagnosis not present

## 2019-07-04 ENCOUNTER — Ambulatory Visit
Admission: RE | Admit: 2019-07-04 | Discharge: 2019-07-04 | Disposition: A | Discharge status: Home / Self Care | Payer: BC Managed Care – PPO | Source: Ambulatory Visit | Attending: Diagnostic Radiology | Admitting: Diagnostic Radiology

## 2019-07-04 ENCOUNTER — Other Ambulatory Visit: Payer: Self-pay

## 2019-07-04 DIAGNOSIS — Z1231 Encounter for screening mammogram for malignant neoplasm of breast: Secondary | ICD-10-CM | POA: Diagnosis not present

## 2019-07-15 DIAGNOSIS — K52831 Collagenous colitis: Secondary | ICD-10-CM | POA: Diagnosis not present

## 2019-07-23 DIAGNOSIS — M5136 Other intervertebral disc degeneration, lumbar region: Secondary | ICD-10-CM | POA: Diagnosis not present

## 2019-07-23 DIAGNOSIS — M9903 Segmental and somatic dysfunction of lumbar region: Secondary | ICD-10-CM | POA: Diagnosis not present

## 2019-07-23 DIAGNOSIS — M9902 Segmental and somatic dysfunction of thoracic region: Secondary | ICD-10-CM | POA: Diagnosis not present

## 2019-07-23 DIAGNOSIS — M9904 Segmental and somatic dysfunction of sacral region: Secondary | ICD-10-CM | POA: Diagnosis not present

## 2019-07-24 DIAGNOSIS — C672 Malignant neoplasm of lateral wall of bladder: Secondary | ICD-10-CM | POA: Diagnosis not present

## 2019-08-20 DIAGNOSIS — M5136 Other intervertebral disc degeneration, lumbar region: Secondary | ICD-10-CM | POA: Diagnosis not present

## 2019-08-20 DIAGNOSIS — M9903 Segmental and somatic dysfunction of lumbar region: Secondary | ICD-10-CM | POA: Diagnosis not present

## 2019-08-20 DIAGNOSIS — M9902 Segmental and somatic dysfunction of thoracic region: Secondary | ICD-10-CM | POA: Diagnosis not present

## 2019-08-20 DIAGNOSIS — M9904 Segmental and somatic dysfunction of sacral region: Secondary | ICD-10-CM | POA: Diagnosis not present

## 2019-09-22 DIAGNOSIS — Z0000 Encounter for general adult medical examination without abnormal findings: Secondary | ICD-10-CM | POA: Diagnosis not present

## 2019-09-22 DIAGNOSIS — Z13220 Encounter for screening for lipoid disorders: Secondary | ICD-10-CM | POA: Diagnosis not present

## 2019-09-22 DIAGNOSIS — Z131 Encounter for screening for diabetes mellitus: Secondary | ICD-10-CM | POA: Diagnosis not present

## 2019-09-22 DIAGNOSIS — D509 Iron deficiency anemia, unspecified: Secondary | ICD-10-CM | POA: Diagnosis not present

## 2019-09-24 DIAGNOSIS — M9903 Segmental and somatic dysfunction of lumbar region: Secondary | ICD-10-CM | POA: Diagnosis not present

## 2019-09-24 DIAGNOSIS — M9902 Segmental and somatic dysfunction of thoracic region: Secondary | ICD-10-CM | POA: Diagnosis not present

## 2019-09-24 DIAGNOSIS — M5136 Other intervertebral disc degeneration, lumbar region: Secondary | ICD-10-CM | POA: Diagnosis not present

## 2019-09-24 DIAGNOSIS — M9904 Segmental and somatic dysfunction of sacral region: Secondary | ICD-10-CM | POA: Diagnosis not present

## 2019-10-22 ENCOUNTER — Ambulatory Visit (INDEPENDENT_AMBULATORY_CARE_PROVIDER_SITE_OTHER): Payer: BC Managed Care – PPO | Admitting: Urology

## 2019-10-22 ENCOUNTER — Other Ambulatory Visit: Payer: Self-pay

## 2019-10-22 ENCOUNTER — Encounter: Payer: Self-pay | Admitting: Urology

## 2019-10-22 ENCOUNTER — Ambulatory Visit: Payer: BC Managed Care – PPO | Admitting: Urology

## 2019-10-22 VITALS — BP 119/76 | HR 70 | Temp 97.2°F | Ht 65.5 in | Wt 160.0 lb

## 2019-10-22 DIAGNOSIS — C672 Malignant neoplasm of lateral wall of bladder: Secondary | ICD-10-CM

## 2019-10-22 LAB — POCT URINALYSIS DIPSTICK
Bilirubin, UA: NEGATIVE
Glucose, UA: NEGATIVE
Ketones, UA: NEGATIVE
Nitrite, UA: NEGATIVE
Protein, UA: NEGATIVE
Spec Grav, UA: <=1.005 — AB (ref 1.010–1.025)
Urobilinogen, UA: NEGATIVE E.U./dL — AB
pH, UA: 7 (ref 5.0–8.0)

## 2019-10-22 MED ORDER — CIPROFLOXACIN HCL 500 MG PO TABS
500.0000 mg | ORAL_TABLET | Freq: Once | ORAL | Status: AC
  Administered 2019-10-22: 500 mg via ORAL

## 2019-10-22 NOTE — Progress Notes (Signed)

## 2019-10-22 NOTE — Progress Notes (Signed)
   10/22/19  CC: hx of bladder cancer  HPI: Julie Villa is a 59yo with a hx of recurrent bladder cancer here for surveillance cystoscopy.  Here records from [place] are as follows: -bladder cancer was treated by removal with scope. Patient denies removal of the entire bladder, radiation, and chemotherapy.   Her last cysto was 01/19/2017.   She does have a good appetite. BOWEL HABITS: her bowels are moving normally. She is not having pain in new locations. She has not recently had unwanted weight loss.   01/26/2017: s/p TURBT. Pathology revealed TaG1 bladder cancer with inverted growth pattern.   01/30/17: Patient returns today with complaints of increased frequency and urgency. She is s/p TURBT and her catheter was removed on Friday. She denies any dysuria, gross hematuria, fevers, or suprapubic pain. She is currently on Vesicare for her voiding symptoms. No nausea or vomiting.   02/14/17: Patient returns today for follow up. Most recent UC was positive for Enterobacter cloacae and she was treated with Doxycyline. Today, she states that he voiding symptoms have returned to baseline. She feels that she is emptying adequately. No fever, dysuria, or gross   02/20/17: She presents today with concerns for UTI. She complains of a foul odor to her urine and cloudy appearance. This has been ongoing for the past week. She denies any dysuria. She states that her voiding symptoms are at baseline. She does feel that she is not emptying completely. No gross hematuria, fever, or chills.   04/19/2017: no hematuria or dysuria. UA today is normal   10/30/2017: UA is neg for blood. no hematuria or dysuria. no worsening LUTS   12/06/17: She was noted to have multiple bladder lesion on most recent cystoscopy and underwent repeat TURBT on 8/22. Pathology showed low grade urothelial carcinoma. She returns today for voiding trial. She states that she has been tolerating her catheter and it has been draining well. She denies  gross hematuria, fever, chills, nausea, or vomiting. She had prior resection in October 2018.   04/22/2018: The patient recently finished BCG. no hematuria/dysuria   07/25/2018: NO new LUTS, no hematuria/dysuria   10/29/2018: No hematuria/dysuria   02/06/2019: NO hematuria or dysuria.   04/24/2019: No hematuria or dysuria   07/24/2019: No hematuria or dysuria. No UTIs since last visit.    Blood pressure 119/76, pulse 70, temperature (!) 97.2 F (36.2 C), height 5' 5.5" (1.664 m), weight 160 lb (72.6 kg), last menstrual period 04/06/2011. NED. A&Ox3.   No respiratory distress   Abd soft, NT, ND Normal external genitalia with patent urethral meatus  Cystoscopy Procedure Note  Patient identification was confirmed, informed consent was obtained, and patient was prepped using Betadine solution.  Lidocaine jelly was administered per urethral meatus.    Procedure: - Flexible cystoscope introduced, without any difficulty.   - Thorough search of the bladder revealed:    normal urethral meatus    normal urothelium    no stones    no ulcers    80mm posterior wall papillary tumor    no urethral polyps    no trabeculation  - Ureteral orifices were normal in position and appearance.  Post-Procedure: - Patient tolerated the procedure well  Assessment/ Plan: We discussed the management of low grade bladder cancer including surveillance versus bladder biopsy with fulgeration and after discussing the options the patient wishes to pursue surveillance. She will return in 3 months for cuystoscopy   No follow-ups on file.  Nicolette Bang, MD

## 2019-10-24 DIAGNOSIS — M9904 Segmental and somatic dysfunction of sacral region: Secondary | ICD-10-CM | POA: Diagnosis not present

## 2019-10-24 DIAGNOSIS — M5136 Other intervertebral disc degeneration, lumbar region: Secondary | ICD-10-CM | POA: Diagnosis not present

## 2019-10-24 DIAGNOSIS — M9902 Segmental and somatic dysfunction of thoracic region: Secondary | ICD-10-CM | POA: Diagnosis not present

## 2019-10-24 DIAGNOSIS — M9903 Segmental and somatic dysfunction of lumbar region: Secondary | ICD-10-CM | POA: Diagnosis not present

## 2019-10-27 ENCOUNTER — Encounter: Payer: Self-pay | Admitting: Urology

## 2019-10-27 NOTE — Patient Instructions (Signed)
Bladder Cancer  Bladder cancer is an abnormal growth of tissue in the bladder. The bladder is the balloon-like sac in the pelvis. It collects and stores urine that comes from the kidneys through the ureters. The bladder wall is made of layers. If cancer spreads into these layers and through the wall of the bladder, it becomes more difficult to treat. What are the causes? The cause of this condition is not known. What increases the risk? The following factors may make you more likely to develop this condition:  Smoking.  Workplace risks (occupational exposures), such as rubber, leather, textile, dyes, chemicals, and paint.  Being white.  Your age. Most people with bladder cancer are over the age of 55.  Being female.  Having chronic bladder inflammation.  Having a personal history of bladder cancer.  Having a family history of bladder cancer (heredity).  Having had chemotherapy or radiation therapy to the pelvis.  Having been exposed to arsenic. What are the signs or symptoms? Initial symptoms of this condition include:  Blood in the urine.  Painful urination.  Frequent bladder or urine infections.  Increase in urgency and frequency of urination. Advanced symptoms of this condition include:  Not being able to urinate.  Low back pain on one side.  Loss of appetite.  Weight loss.  Fatigue.  Swelling in the feet.  Bone pain. How is this diagnosed? This condition is diagnosed based on your medical history, a physical exam, urine tests, lab tests, imaging tests, and your symptoms. You may also have other tests or procedures done, such as:  A narrow tube being inserted into your bladder through your urethra (cystoscopy) in order to view the lining of your bladder for tumors.  A biopsy to sample the tumor to see if cancer is present. If cancer is present, it will then be staged to determine its severity and extent. Staging is an assessment of:  The size of the  tumor.  Whether the cancer has spread.  Where the cancer has spread. It is important to know how deeply into the bladder wall cancer has grown and whether cancer has spread to any other parts of your body. Staging may require blood tests or imaging tests, such as a CT scan, MRI, bone scan, or chest X-ray. How is this treated? Based on the stage of cancer, one treatment or a combination of treatments may be recommended. The most common forms of treatment are:  Surgery to remove the cancer. Procedures that may be done include transurethral resection and cystectomy.  Radiation therapy. This is high-energy X-rays or other particles. This is often used in combination with chemotherapy.  Chemotherapy. During this treatment, medicines are used to kill cancer cells.  Immunotherapy. This uses medicines to help your own immune system destroy cancer cells. Follow these instructions at home:  Take over-the-counter and prescription medicines only as told by your health care provider.  Maintain a healthy diet. Some of your treatments might affect your appetite.  Consider joining a support group. This may help you learn to cope with the stress of having bladder cancer.  Tell your cancer care team if you develop side effects. They may be able to recommend ways to relieve them.  Keep all follow-up visits as told by your health care provider. This is important. Where to find more information  American Cancer Society: www.cancer.org  National Cancer Institute (NCI): www.cancer.gov Contact a health care provider if:  You have symptoms of a urinary tract infection. These include: ?   Fever. ? Chills. ? Weakness. ? Muscle aches. ? Abdominal pain. ? Frequent and intense urge to urinate. ? Burning feeling in the bladder or urethra during urination. Get help right away if:  There is blood in your urine.  You cannot urinate.  You have severe pain or other symptoms that do not go  away. Summary  Bladder cancer is an abnormal growth of tissue in the bladder.  This condition is diagnosed based on your medical history, a physical exam, urine tests, lab tests, imaging tests, and your symptoms.  Based on the stage of cancer, surgery, chemotherapy, or a combination of treatments may be recommended.  Consider joining a support group. This may help you learn to cope with the stress of having bladder cancer. This information is not intended to replace advice given to you by your health care provider. Make sure you discuss any questions you have with your health care provider. Document Revised: 03/09/2017 Document Reviewed: 02/29/2016 Elsevier Patient Education  2020 Elsevier Inc.  

## 2019-11-19 DIAGNOSIS — M5136 Other intervertebral disc degeneration, lumbar region: Secondary | ICD-10-CM | POA: Diagnosis not present

## 2019-11-19 DIAGNOSIS — M9903 Segmental and somatic dysfunction of lumbar region: Secondary | ICD-10-CM | POA: Diagnosis not present

## 2019-11-19 DIAGNOSIS — M9904 Segmental and somatic dysfunction of sacral region: Secondary | ICD-10-CM | POA: Diagnosis not present

## 2019-11-19 DIAGNOSIS — M9902 Segmental and somatic dysfunction of thoracic region: Secondary | ICD-10-CM | POA: Diagnosis not present

## 2019-12-08 ENCOUNTER — Telehealth: Payer: Self-pay | Admitting: Adult Health

## 2019-12-08 NOTE — Telephone Encounter (Signed)
Rescheduled appt per [facility] request. LC will not be in the office on 9/3. Pt confirmed new appt date and time.

## 2019-12-12 ENCOUNTER — Inpatient Hospital Stay: Payer: BC Managed Care – PPO | Admitting: Adult Health

## 2019-12-17 DIAGNOSIS — M9903 Segmental and somatic dysfunction of lumbar region: Secondary | ICD-10-CM | POA: Diagnosis not present

## 2019-12-17 DIAGNOSIS — M5136 Other intervertebral disc degeneration, lumbar region: Secondary | ICD-10-CM | POA: Diagnosis not present

## 2019-12-17 DIAGNOSIS — M9902 Segmental and somatic dysfunction of thoracic region: Secondary | ICD-10-CM | POA: Diagnosis not present

## 2019-12-17 DIAGNOSIS — M9904 Segmental and somatic dysfunction of sacral region: Secondary | ICD-10-CM | POA: Diagnosis not present

## 2020-01-14 DIAGNOSIS — M9902 Segmental and somatic dysfunction of thoracic region: Secondary | ICD-10-CM | POA: Diagnosis not present

## 2020-01-14 DIAGNOSIS — M5136 Other intervertebral disc degeneration, lumbar region: Secondary | ICD-10-CM | POA: Diagnosis not present

## 2020-01-14 DIAGNOSIS — M9903 Segmental and somatic dysfunction of lumbar region: Secondary | ICD-10-CM | POA: Diagnosis not present

## 2020-01-14 DIAGNOSIS — M9904 Segmental and somatic dysfunction of sacral region: Secondary | ICD-10-CM | POA: Diagnosis not present

## 2020-01-21 ENCOUNTER — Other Ambulatory Visit: Payer: Self-pay

## 2020-01-21 ENCOUNTER — Encounter: Payer: Self-pay | Admitting: Urology

## 2020-01-21 ENCOUNTER — Ambulatory Visit (INDEPENDENT_AMBULATORY_CARE_PROVIDER_SITE_OTHER): Payer: BC Managed Care – PPO | Admitting: Urology

## 2020-01-21 VITALS — BP 119/81 | HR 66 | Temp 98.0°F | Ht 65.5 in | Wt 160.0 lb

## 2020-01-21 DIAGNOSIS — C672 Malignant neoplasm of lateral wall of bladder: Secondary | ICD-10-CM | POA: Diagnosis not present

## 2020-01-21 LAB — URINALYSIS, ROUTINE W REFLEX MICROSCOPIC
Bilirubin, UA: NEGATIVE
Glucose, UA: NEGATIVE
Ketones, UA: NEGATIVE
Leukocytes,UA: NEGATIVE
Nitrite, UA: NEGATIVE
Protein,UA: NEGATIVE
RBC, UA: NEGATIVE
Specific Gravity, UA: 1.01 (ref 1.005–1.030)
Urobilinogen, Ur: 0.2 mg/dL (ref 0.2–1.0)
pH, UA: 7 (ref 5.0–7.5)

## 2020-01-21 MED ORDER — CIPROFLOXACIN HCL 500 MG PO TABS
500.0000 mg | ORAL_TABLET | Freq: Once | ORAL | Status: AC
  Administered 2020-01-21: 500 mg via ORAL

## 2020-01-21 NOTE — Patient Instructions (Signed)
Bladder Cancer  Bladder cancer is an abnormal growth of tissue in the bladder. The bladder is the balloon-like sac in the pelvis. It collects and stores urine that comes from the kidneys through the ureters. The bladder wall is made of layers. If cancer spreads into these layers and through the wall of the bladder, it becomes more difficult to treat. What are the causes? The cause of this condition is not known. What increases the risk? The following factors may make you more likely to develop this condition:  Smoking.  Workplace risks (occupational exposures), such as rubber, leather, textile, dyes, chemicals, and paint.  Being white.  Your age. Most people with bladder cancer are over the age of 55.  Being female.  Having chronic bladder inflammation.  Having a personal history of bladder cancer.  Having a family history of bladder cancer (heredity).  Having had chemotherapy or radiation therapy to the pelvis.  Having been exposed to arsenic. What are the signs or symptoms? Initial symptoms of this condition include:  Blood in the urine.  Painful urination.  Frequent bladder or urine infections.  Increase in urgency and frequency of urination. Advanced symptoms of this condition include:  Not being able to urinate.  Low back pain on one side.  Loss of appetite.  Weight loss.  Fatigue.  Swelling in the feet.  Bone pain. How is this diagnosed? This condition is diagnosed based on your medical history, a physical exam, urine tests, lab tests, imaging tests, and your symptoms. You may also have other tests or procedures done, such as:  A narrow tube being inserted into your bladder through your urethra (cystoscopy) in order to view the lining of your bladder for tumors.  A biopsy to sample the tumor to see if cancer is present. If cancer is present, it will then be staged to determine its severity and extent. Staging is an assessment of:  The size of the  tumor.  Whether the cancer has spread.  Where the cancer has spread. It is important to know how deeply into the bladder wall cancer has grown and whether cancer has spread to any other parts of your body. Staging may require blood tests or imaging tests, such as a CT scan, MRI, bone scan, or chest X-ray. How is this treated? Based on the stage of cancer, one treatment or a combination of treatments may be recommended. The most common forms of treatment are:  Surgery to remove the cancer. Procedures that may be done include transurethral resection and cystectomy.  Radiation therapy. This is high-energy X-rays or other particles. This is often used in combination with chemotherapy.  Chemotherapy. During this treatment, medicines are used to kill cancer cells.  Immunotherapy. This uses medicines to help your own immune system destroy cancer cells. Follow these instructions at home:  Take over-the-counter and prescription medicines only as told by your health care provider.  Maintain a healthy diet. Some of your treatments might affect your appetite.  Consider joining a support group. This may help you learn to cope with the stress of having bladder cancer.  Tell your cancer care team if you develop side effects. They may be able to recommend ways to relieve them.  Keep all follow-up visits as told by your health care provider. This is important. Where to find more information  American Cancer Society: www.cancer.org  National Cancer Institute (NCI): www.cancer.gov Contact a health care provider if:  You have symptoms of a urinary tract infection. These include: ?   Fever. ? Chills. ? Weakness. ? Muscle aches. ? Abdominal pain. ? Frequent and intense urge to urinate. ? Burning feeling in the bladder or urethra during urination. Get help right away if:  There is blood in your urine.  You cannot urinate.  You have severe pain or other symptoms that do not go  away. Summary  Bladder cancer is an abnormal growth of tissue in the bladder.  This condition is diagnosed based on your medical history, a physical exam, urine tests, lab tests, imaging tests, and your symptoms.  Based on the stage of cancer, surgery, chemotherapy, or a combination of treatments may be recommended.  Consider joining a support group. This may help you learn to cope with the stress of having bladder cancer. This information is not intended to replace advice given to you by your health care provider. Make sure you discuss any questions you have with your health care provider. Document Revised: 03/09/2017 Document Reviewed: 02/29/2016 Elsevier Patient Education  2020 Elsevier Inc.  

## 2020-01-21 NOTE — H&P (View-Only) (Signed)
   01/21/20  CC: Followup bladder cancer  HPI: Julie Villa is a 59yo here for followup for recurrent bladder cancer.  Blood pressure 119/81, pulse 66, temperature 98 F (36.7 C), height 5' 5.5" (1.664 m), weight 160 lb (72.6 kg), last menstrual period 04/06/2011. NED. A&Ox3.   No respiratory distress   Abd soft, NT, ND Normal external genitalia with patent urethral meatus  Cystoscopy Procedure Note  Patient identification was confirmed, informed consent was obtained, and patient was prepped using Betadine solution.  Lidocaine jelly was administered per urethral meatus.    Procedure: - Flexible cystoscope introduced, without any difficulty.   - Thorough search of the bladder revealed:    normal urethral meatus    normal urothelium    no stones    no ulcers     31mm right posterior wall tumor has increased in size since last cystoscopy    no urethral polyps    no trabeculation  - Ureteral orifices were normal in position and appearance.  Post-Procedure: - Patient tolerated the procedure well  Assessment/ Plan: We discussed the management and we have elected to proceed with TURBT. Risks/benefits/alternatives discussed   No follow-ups on file.  Nicolette Bang, MD

## 2020-01-21 NOTE — Progress Notes (Signed)
   01/21/20  CC: Followup bladder cancer  HPI: [name] is a 59yo here for followup for recurrent bladder cancer.  Blood pressure 119/81, pulse 66, temperature 98 F (36.7 C), height 5' 5.5" (1.664 m), weight 160 lb (72.6 kg), last menstrual period 04/06/2011. NED. A&Ox3.   No respiratory distress   Abd soft, NT, ND Normal external genitalia with patent urethral meatus  Cystoscopy Procedure Note  Patient identification was confirmed, informed consent was obtained, and patient was prepped using Betadine solution.  Lidocaine jelly was administered per urethral meatus.    Procedure: - Flexible cystoscope introduced, without any difficulty.   - Thorough search of the bladder revealed:    normal urethral meatus    normal urothelium    no stones    no ulcers     84mm right posterior wall tumor has increased in size since last cystoscopy    no urethral polyps    no trabeculation  - Ureteral orifices were normal in position and appearance.  Post-Procedure: - Patient tolerated the procedure well  Assessment/ Plan: We discussed the management and we have elected to proceed with TURBT. Risks/benefits/alternatives discussed   No follow-ups on file.  Nicolette Bang, MD

## 2020-01-21 NOTE — Progress Notes (Signed)
Urological Symptom Review  Patient is experiencing the following symptoms: Frequent urination Hard to postpone urination Get up at night to urinate   Review of Systems  Gastrointestinal (upper)  : Negative for upper GI symptoms  Gastrointestinal (lower) : Diarrhea  Constitutional : Negative for symptoms  Skin: Negative for skin symptoms  Eyes: Negative for eye symptoms  Ear/Nose/Throat : Negative for Ear/Nose/Throat symptoms  Hematologic/Lymphatic: Negative for Hematologic/Lymphatic symptoms  Cardiovascular : Negative for cardiovascular symptoms  Respiratory : Negative for respiratory symptoms  Endocrine: Negative for endocrine symptoms  Musculoskeletal: Negative for musculoskeletal symptoms  Neurological: Negative for neurological symptoms  Psychologic: Negative for psychiatric symptoms

## 2020-01-26 ENCOUNTER — Telehealth: Payer: Self-pay | Admitting: Adult Health

## 2020-01-26 NOTE — Telephone Encounter (Signed)
Rescheduled per provider. Called and spoke with pt, confirmed 11/19 appt

## 2020-01-28 ENCOUNTER — Inpatient Hospital Stay: Payer: BC Managed Care – PPO | Admitting: Adult Health

## 2020-01-30 NOTE — Patient Instructions (Signed)
Julie Villa  01/30/2020     @PREFPERIOPPHARMACY @   Your procedure is scheduled on  02/05/2020.  Report to [facility] at  1100  A.M.  Call this number if you have problems the morning of surgery:  [phone]   Remember:  Do not eat or drink after midnight.                          Take these medicines the morning of surgery with A SIP OF WATER  veicare.    Do not wear jewelry, make-up or nail polish.  Do not wear lotions, powders, or perfumes. Please wear deodorant and brush your teeth.  Do not shave 48 hours prior to surgery.  Men may shave face and neck.  Do not bring valuables to the hospital.  [facility] is not responsible for any belongings or valuables.  Contacts, dentures or bridgework may not be worn into surgery.  Leave your suitcase in the car.  After surgery it may be brought to your room.  For patients admitted to the hospital, discharge time will be determined by your treatment team.  Patients discharged the day of surgery will not be allowed to drive home.   Name and phone number of your driver:   family Special instructions:  DO NOT smoke the morning of your procedure.  Please read over the following fact sheets that you were given. Anesthesia Post-op Instructions and Care and Recovery After Surgery       Transurethral Resection of Bladder Tumor, Care After This sheet gives you information about how to care for yourself after your procedure. Your health care provider may also give you more specific instructions. If you have problems or questions, contact your health care provider. What can I expect after the procedure? After the procedure, it is common to have:  A small amount of blood in your urine for up to 2 weeks.  Soreness or mild pain from your catheter. After your catheter is removed, you may have mild soreness, especially when urinating.  Pain in your lower abdomen. Follow these instructions at home: Medicines   Take  over-the-counter and prescription medicines only as told by your health care provider.  If you were prescribed an antibiotic medicine, take it as told by your health care provider. Do not stop taking the antibiotic even if you start to feel better.  Do not drive for 24 hours if you were given a sedative during your procedure.  Ask your health care provider if the medicine prescribed to you: ? Requires you to avoid driving or using heavy machinery. ? Can cause constipation. You may need to take these actions to prevent or treat constipation:  Take over-the-counter or prescription medicines.  Eat foods that are high in fiber, such as beans, whole grains, and fresh fruits and vegetables.  Limit foods that are high in fat and processed sugars, such as fried or sweet foods. Activity  Return to your normal activities as told by your health care provider. Ask your health care provider what activities are safe for you.  Do not lift anything that is heavier than 10 lb (4.5 kg), or the limit that you are told, until your health care provider says that it is safe.  Avoid intense physical activity for as long as told by your health care provider.  Rest as told by your health care provider.  Avoid sitting for a long time  without moving. Get up to take short walks every 1-2 hours. This is important to improve blood flow and breathing. Ask for help if you feel weak or unsteady. General instructions   Do not drink alcohol for as long as told by your health care provider. This is especially important if you are taking prescription pain medicines.  Do not take baths, swim, or use a hot tub until your health care provider approves. Ask your health care provider if you may take showers. You may only be allowed to take sponge baths.  If you have a catheter, follow instructions from your health care provider about caring for your catheter and your drainage bag.  Drink enough fluid to keep your urine  pale yellow.  Wear compression stockings as told by your health care provider. These stockings help to prevent blood clots and reduce swelling in your legs.  Keep all follow-up visits as told by your health care provider. This is important. ? You will need to be followed closely with regular checks of your bladder and urethra (cystoscopies) to make sure that the cancer does not come back. Contact a health care provider if:  You have pain that gets worse or does not improve with medicine.  You have blood in your urine for more than 2 weeks.  You have cloudy or bad-smelling urine.  You become constipated. Signs of constipation may include having: ? Fewer than three bowel movements in a week. ? Difficulty having a bowel movement. ? Stools that are dry, hard, or larger than normal.  You have a fever. Get help right away if:  You have: ? Severe pain. ? Bright red blood in your urine. ? Blood clots in your urine. ? A lot of blood in your urine.  Your catheter has been removed and you are not able to urinate.  You have a catheter in place and the catheter is not draining urine. Summary  After your procedure, it is common to have a small amount of blood in your urine, soreness or mild pain from your catheter, and pain in your lower abdomen.  Take over-the-counter and prescription medicines only as told by your health care provider.  Rest as told by your health care provider. Follow your health care provider's instructions about returning to normal activities. Ask what activities are safe for you.  If you have a catheter, follow instructions from your health care provider about caring for your catheter and your drainage bag.  Get help right away if you cannot urinate, you have severe pain, or you have bright red blood or blood clots in your urine. This information is not intended to replace advice given to you by your health care provider. Make sure you discuss any questions you have  with your health care provider. Document Revised: 10/25/2017 Document Reviewed: 10/25/2017 Elsevier Patient Education  [place] After These instructions provide you with information about caring for yourself after your procedure. Your health care provider may also give you more specific instructions. Your treatment has been planned according to current medical practices, but problems sometimes occur. Call your health care provider if you have any problems or questions after your procedure. What can I expect after the procedure? After your procedure, you may:  Feel sleepy for several hours.  Feel clumsy and have poor balance for several hours.  Feel forgetful about what happened after the procedure.  Have poor judgment for several hours.  Feel nauseous or vomit.  Have a sore throat if you had a breathing tube during the procedure. Follow these instructions at home: For at least 24 hours after the procedure:      Have a responsible adult stay with you. It is important to have someone help care for you until you are awake and alert.  Rest as needed.  Do not: ? Participate in activities in which you could fall or become injured. ? Drive. ? Use heavy machinery. ? Drink alcohol. ? Take sleeping pills or medicines that cause drowsiness. ? Make important decisions or sign legal documents. ? Take care of children on your own. Eating and drinking  Follow the diet that is recommended by your health care provider.  If you vomit, drink water, juice, or soup when you can drink without vomiting.  Make sure you have little or no nausea before eating solid foods. General instructions  Take over-the-counter and prescription medicines only as told by your health care provider.  If you have sleep apnea, surgery and certain medicines can increase your risk for breathing problems. Follow instructions from your health care provider about wearing  your sleep device: ? Anytime you are sleeping, including during daytime naps. ? While taking prescription pain medicines, sleeping medicines, or medicines that make you drowsy.  If you smoke, do not smoke without supervision.  Keep all follow-up visits as told by your health care provider. This is important. Contact a health care provider if:  You keep feeling nauseous or you keep vomiting.  You feel light-headed.  You develop a rash.  You have a fever. Get help right away if:  You have trouble breathing. Summary  For several hours after your procedure, you may feel sleepy and have poor judgment.  Have a responsible adult stay with you for at least 24 hours or until you are awake and alert. This information is not intended to replace advice given to you by your health care provider. Make sure you discuss any questions you have with your health care provider. Document Revised: 06/25/2017 Document Reviewed: 07/18/2015 Elsevier Patient Education  [place].

## 2020-02-03 ENCOUNTER — Encounter (HOSPITAL_COMMUNITY)
Admission: RE | Admit: 2020-02-03 | Discharge: 2020-02-03 | Disposition: A | Discharge status: Home / Self Care | Payer: BC Managed Care – PPO | Source: Ambulatory Visit | Attending: Urology | Admitting: Urology

## 2020-02-03 ENCOUNTER — Other Ambulatory Visit (HOSPITAL_COMMUNITY)
Admission: RE | Admit: 2020-02-03 | Discharge: 2020-02-03 | Disposition: A | Discharge status: Home / Self Care | Payer: BC Managed Care – PPO | Source: Ambulatory Visit | Attending: Urology | Admitting: Urology

## 2020-02-03 ENCOUNTER — Encounter (HOSPITAL_COMMUNITY): Payer: Self-pay

## 2020-02-03 ENCOUNTER — Other Ambulatory Visit: Payer: Self-pay

## 2020-02-03 DIAGNOSIS — Z20822 Contact with and (suspected) exposure to covid-19: Secondary | ICD-10-CM | POA: Insufficient documentation

## 2020-02-03 DIAGNOSIS — Z01812 Encounter for preprocedural laboratory examination: Secondary | ICD-10-CM | POA: Insufficient documentation

## 2020-02-03 LAB — CBC WITH DIFFERENTIAL/PLATELET
Abs Immature Granulocytes: 0.01 10*3/uL (ref 0.00–0.07)
Basophils Absolute: 0 10*3/uL (ref 0.0–0.1)
Basophils Relative: 1 %
Eosinophils Absolute: 0.1 10*3/uL (ref 0.0–0.5)
Eosinophils Relative: 2 %
HCT: 35.3 % — ABNORMAL LOW (ref 36.0–46.0)
Hemoglobin: 11.7 g/dL — ABNORMAL LOW (ref 12.0–15.0)
Immature Granulocytes: 0 %
Lymphocytes Relative: 34 %
Lymphs Abs: 1.7 10*3/uL (ref 0.7–4.0)
MCH: 31.8 pg (ref 26.0–34.0)
MCHC: 33.1 g/dL (ref 30.0–36.0)
MCV: 95.9 fL (ref 80.0–100.0)
Monocytes Absolute: 0.4 10*3/uL (ref 0.1–1.0)
Monocytes Relative: 8 %
Neutro Abs: 2.8 10*3/uL (ref 1.7–7.7)
Neutrophils Relative %: 55 %
Platelets: 229 10*3/uL (ref 150–400)
RBC: 3.68 MIL/uL — ABNORMAL LOW (ref 3.87–5.11)
RDW: 11.8 % (ref 11.5–15.5)
WBC: 5.1 10*3/uL (ref 4.0–10.5)
nRBC: 0 % (ref 0.0–0.2)

## 2020-02-03 LAB — BASIC METABOLIC PANEL
Anion gap: 9 (ref 5–15)
BUN: 18 mg/dL (ref 6–20)
CO2: 24 mmol/L (ref 22–32)
Calcium: 9.1 mg/dL (ref 8.9–10.3)
Chloride: 107 mmol/L (ref 98–111)
Creatinine, Ser: 0.84 mg/dL (ref 0.44–1.00)
GFR, Estimated: >60 mL/min (ref >60)
Glucose, Bld: 91 mg/dL (ref 70–99)
Potassium: 4.3 mmol/L (ref 3.5–5.1)
Sodium: 140 mmol/L (ref 135–145)

## 2020-02-04 LAB — SARS CORONAVIRUS 2 (TAT 6-24 HRS): SARS Coronavirus 2: NEGATIVE

## 2020-02-05 ENCOUNTER — Encounter (HOSPITAL_COMMUNITY)
Admission: RE | Disposition: A | Discharge status: Home / Self Care | Payer: Self-pay | Source: Home / Self Care | Attending: Urology

## 2020-02-05 ENCOUNTER — Ambulatory Visit (HOSPITAL_COMMUNITY): Payer: BC Managed Care – PPO | Admitting: Anesthesiology

## 2020-02-05 ENCOUNTER — Ambulatory Visit (HOSPITAL_COMMUNITY)
Admission: RE | Admit: 2020-02-05 | Discharge: 2020-02-05 | Disposition: A | Discharge status: Home / Self Care | Payer: BC Managed Care – PPO | Attending: Urology | Admitting: Urology

## 2020-02-05 ENCOUNTER — Encounter (HOSPITAL_COMMUNITY): Payer: Self-pay | Admitting: Urology

## 2020-02-05 DIAGNOSIS — D494 Neoplasm of unspecified behavior of bladder: Secondary | ICD-10-CM

## 2020-02-05 DIAGNOSIS — Z87891 Personal history of nicotine dependence: Secondary | ICD-10-CM | POA: Insufficient documentation

## 2020-02-05 DIAGNOSIS — C679 Malignant neoplasm of bladder, unspecified: Secondary | ICD-10-CM | POA: Diagnosis not present

## 2020-02-05 HISTORY — PX: CYSTOSCOPY: SHX5120

## 2020-02-05 HISTORY — PX: TRANSURETHRAL RESECTION OF BLADDER TUMOR: SHX2575

## 2020-02-05 SURGERY — CYSTOSCOPY
Anesthesia: General

## 2020-02-05 MED ORDER — OXYCODONE HCL 5 MG/5ML PO SOLN: 5.0000 mg | Freq: Once | ORAL | Status: DC | PRN

## 2020-02-05 MED ORDER — DEXAMETHASONE SODIUM PHOSPHATE 10 MG/ML IJ SOLN
INTRAMUSCULAR | Status: AC
  Filled 2020-02-05: qty 1

## 2020-02-05 MED ORDER — CHLORHEXIDINE GLUCONATE 0.12 % MT SOLN
15.0000 mL | Freq: Once | OROMUCOSAL | Status: AC
  Administered 2020-02-05: 15 mL via OROMUCOSAL
  Filled 2020-02-05: qty 15

## 2020-02-05 MED ORDER — DEXAMETHASONE SODIUM PHOSPHATE 4 MG/ML IJ SOLN
INTRAMUSCULAR | Status: DC | PRN
  Administered 2020-02-05: 8 mg via INTRAVENOUS

## 2020-02-05 MED ORDER — LIDOCAINE 2% (20 MG/ML) 5 ML SYRINGE
INTRAMUSCULAR | Status: AC
  Filled 2020-02-05: qty 5

## 2020-02-05 MED ORDER — CEFAZOLIN SODIUM-DEXTROSE 2-4 GM/100ML-% IV SOLN
2.0000 g | INTRAVENOUS | Status: AC
  Administered 2020-02-05: 2 g via INTRAVENOUS
  Filled 2020-02-05: qty 100

## 2020-02-05 MED ORDER — ACETAMINOPHEN 10 MG/ML IV SOLN: 1000.0000 mg | Freq: Once | INTRAVENOUS | Status: DC | PRN

## 2020-02-05 MED ORDER — HYDROMORPHONE HCL 1 MG/ML IJ SOLN: 0.2500 mg | INTRAMUSCULAR | Status: DC | PRN

## 2020-02-05 MED ORDER — LIDOCAINE HCL (CARDIAC) PF 100 MG/5ML IV SOSY
PREFILLED_SYRINGE | INTRAVENOUS | Status: DC | PRN
  Administered 2020-02-05: 100 mg via INTRAVENOUS

## 2020-02-05 MED ORDER — DROPERIDOL 2.5 MG/ML IJ SOLN: 0.6250 mg | Freq: Once | INTRAMUSCULAR | Status: DC | PRN

## 2020-02-05 MED ORDER — MIDAZOLAM HCL 2 MG/2ML IJ SOLN
INTRAMUSCULAR | Status: AC
  Filled 2020-02-05: qty 2

## 2020-02-05 MED ORDER — OXYCODONE HCL 5 MG PO TABS: 5.0000 mg | ORAL_TABLET | Freq: Once | ORAL | Status: DC | PRN

## 2020-02-05 MED ORDER — ORAL CARE MOUTH RINSE: 15.0000 mL | Freq: Once | OROMUCOSAL | Status: AC

## 2020-02-05 MED ORDER — STERILE WATER FOR IRRIGATION IR SOLN
Status: DC | PRN
  Administered 2020-02-05: 500 mL

## 2020-02-05 MED ORDER — ONDANSETRON HCL 4 MG/2ML IJ SOLN
INTRAMUSCULAR | Status: AC
  Filled 2020-02-05: qty 2

## 2020-02-05 MED ORDER — PROPOFOL 10 MG/ML IV BOLUS
INTRAVENOUS | Status: AC
  Filled 2020-02-05: qty 20

## 2020-02-05 MED ORDER — ONDANSETRON HCL 4 MG/2ML IJ SOLN: 4.0000 mg | Freq: Once | INTRAMUSCULAR | Status: DC | PRN

## 2020-02-05 MED ORDER — FENTANYL CITRATE (PF) 100 MCG/2ML IJ SOLN
INTRAMUSCULAR | Status: AC
  Filled 2020-02-05: qty 2

## 2020-02-05 MED ORDER — MEPERIDINE HCL 50 MG/ML IJ SOLN: 6.2500 mg | INTRAMUSCULAR | Status: DC | PRN

## 2020-02-05 MED ORDER — OXYCODONE-ACETAMINOPHEN 5-325 MG PO TABS: 1.0000 | ORAL_TABLET | ORAL | 0 refills | Status: DC | PRN

## 2020-02-05 MED ORDER — ONDANSETRON HCL 4 MG/2ML IJ SOLN
INTRAMUSCULAR | Status: DC | PRN
  Administered 2020-02-05: 4 mg via INTRAVENOUS

## 2020-02-05 MED ORDER — PROPOFOL 10 MG/ML IV BOLUS
INTRAVENOUS | Status: DC | PRN
  Administered 2020-02-05: 130 mg via INTRAVENOUS

## 2020-02-05 MED ORDER — SODIUM CHLORIDE 0.9 % IR SOLN
Status: DC | PRN
  Administered 2020-02-05: 3000 mL

## 2020-02-05 MED ORDER — MIDAZOLAM HCL 5 MG/5ML IJ SOLN
INTRAMUSCULAR | Status: DC | PRN
  Administered 2020-02-05: 2 mg via INTRAVENOUS

## 2020-02-05 MED ORDER — LACTATED RINGERS IV SOLN: INTRAVENOUS | Status: DC

## 2020-02-05 MED ORDER — FENTANYL CITRATE (PF) 100 MCG/2ML IJ SOLN
INTRAMUSCULAR | Status: DC | PRN
  Administered 2020-02-05: 50 ug via INTRAVENOUS

## 2020-02-05 SURGICAL SUPPLY — 31 items
BAG DRAIN URO TABLE W/ADPT NS (BAG) ×2 IMPLANT
BAG DRN 8 ADPR NS SKTRN CSTL (BAG) ×1
BAG DRN RND TRDRP ANRFLXCHMBR (UROLOGICAL SUPPLIES) ×1
BAG HAMPER (MISCELLANEOUS) ×2 IMPLANT
BAG URINE DRAIN 2000ML AR STRL (UROLOGICAL SUPPLIES) ×2 IMPLANT
CATH FOLEY 3WAY 30CC 22F (CATHETERS) IMPLANT
CATH FOLEY LATEX FREE 22FR (CATHETERS)
CATH FOLEY LF 22FR (CATHETERS) IMPLANT
CATH INTERMIT  6FR 70CM (CATHETERS) ×2 IMPLANT
CLOTH BEACON ORANGE TIMEOUT ST (SAFETY) ×2 IMPLANT
ELECT LOOP 22F BIPOLAR SML (ELECTROSURGICAL) ×2
ELECT REM PT RETURN 9FT ADLT (ELECTROSURGICAL) ×2
ELECTRODE LOOP 22F BIPOLAR SML (ELECTROSURGICAL) ×1 IMPLANT
ELECTRODE REM PT RTRN 9FT ADLT (ELECTROSURGICAL) ×1 IMPLANT
GLOVE BIO SURGEON STRL SZ8 (GLOVE) ×2 IMPLANT
GLOVE BIOGEL PI IND STRL 7.0 (GLOVE) ×2 IMPLANT
GLOVE BIOGEL PI INDICATOR 7.0 (GLOVE) ×2
GOWN STRL REUS W/TWL LRG LVL3 (GOWN DISPOSABLE) ×6 IMPLANT
GOWN STRL REUS W/TWL XL LVL3 (GOWN DISPOSABLE) ×2 IMPLANT
IV NS IRRIG 3000ML ARTHROMATIC (IV SOLUTION) ×2 IMPLANT
KIT TURNOVER CYSTO (KITS) ×2 IMPLANT
MANIFOLD NEPTUNE II (INSTRUMENTS) ×2 IMPLANT
PACK CYSTO (CUSTOM PROCEDURE TRAY) ×2 IMPLANT
PAD ARMBOARD 7.5X6 YLW CONV (MISCELLANEOUS) ×2 IMPLANT
PLUG CATH AND CAP STER (CATHETERS) ×2 IMPLANT
SYR 30ML LL (SYRINGE) ×2 IMPLANT
SYR TOOMEY IRRIG 70ML (MISCELLANEOUS) ×2
SYRINGE TOOMEY IRRIG 70ML (MISCELLANEOUS) ×1 IMPLANT
TOWEL NATURAL 4PK STERILE (DISPOSABLE) ×2 IMPLANT
TOWEL OR 17X26 4PK STRL BLUE (TOWEL DISPOSABLE) ×2 IMPLANT
WATER STERILE IRR 500ML POUR (IV SOLUTION) ×2 IMPLANT

## 2020-02-05 NOTE — Anesthesia Procedure Notes (Addendum)
Procedure Name: LMA Insertion Performed by: Aldwin Micalizzi L, CRNA Pre-anesthesia Checklist: Patient identified, Emergency Drugs available, Suction available, Patient being monitored and Timeout performed Patient Re-evaluated:Patient Re-evaluated prior to induction Oxygen Delivery Method: Circle system utilized Preoxygenation: Pre-oxygenation with 100% oxygen Induction Type: IV induction LMA: LMA inserted LMA Size: 4.0 Number of attempts: 1 Placement Confirmation: positive ETCO2,  CO2 detector and breath sounds checked- equal and bilateral Tube secured with: Tape Dental Injury: Teeth and Oropharynx as per pre-operative assessment        

## 2020-02-05 NOTE — Anesthesia Preprocedure Evaluation (Signed)
Anesthesia Evaluation  Patient identified by MRN, date of birth, ID band Patient awake    Reviewed: Allergy & Precautions, NPO status , Patient's Chart, lab work & pertinent test results, reviewed documented beta blocker date and time   History of Anesthesia Complications Negative for: history of anesthetic complications  Airway Mallampati: II  TM Distance: >3 FB Neck ROM: Full    Dental no notable dental hx.    Pulmonary neg pulmonary ROS, former smoker,    Pulmonary exam normal breath sounds clear to auscultation       Cardiovascular negative cardio ROS Normal cardiovascular exam Rhythm:Regular Rate:Normal     Neuro/Psych    GI/Hepatic negative GI ROS, Neg liver ROS,   Endo/Other  negative endocrine ROS  Renal/GU      Musculoskeletal   Abdominal   Peds  Hematology   Anesthesia Other Findings   Reproductive/Obstetrics                             Anesthesia Physical Anesthesia Plan  ASA: II  Anesthesia Plan: General   Post-op Pain Management:    Induction: Intravenous  PONV Risk Score and Plan:   Airway Management Planned: LMA  Additional Equipment:   Intra-op Plan:   Post-operative Plan: Extubation in OR  Informed Consent: I have reviewed the patients History and Physical, chart, labs and discussed the procedure including the risks, benefits and alternatives for the proposed anesthesia with the patient or authorized representative who has indicated his/her understanding and acceptance.     Dental advisory given  Plan Discussed with: CRNA  Anesthesia Plan Comments:         Anesthesia Quick Evaluation

## 2020-02-05 NOTE — Interval H&P Note (Signed)
History and Physical Interval Note:  02/05/2020 12:46 PM  Julie Villa  has presented today for surgery, with the diagnosis of bladder tumor.  The various methods of treatment have been discussed with the patient and family. After consideration of risks, benefits and other options for treatment, the patient has consented to  Procedure(s): CYSTOSCOPY (N/A) TRANSURETHRAL RESECTION OF BLADDER TUMOR (TURBT) (N/A) as a surgical intervention.  The patient's history has been reviewed, patient examined, no change in status, stable for surgery.  I have reviewed the patient's chart and labs.  Questions were answered to the patient's satisfaction.     Nicolette Bang

## 2020-02-05 NOTE — Discharge Instructions (Signed)
Bladder Biopsy, Care After This sheet gives you information about how to care for yourself after your procedure. Your health care provider may also give you more specific instructions. If you have problems or questions, contact your health care provider. What can I expect after the procedure? After the procedure, it is common to have:  Mild pain in your bladder or kidney area during urination.  Minor burning during urination.  Small amounts of blood in your urine.  A sudden urge to urinate.  A need to urinate more often than usual. Follow these instructions at home: Medicines  Take over-the-counter and prescription medicines only as told by your health care provider.  If you were prescribed an antibiotic medicine, take it as told by your health care provider. Do not stop taking the antibiotic even if you start to feel better. Activity  Rest if told by your health care provider.  Do not drive for 24 hours if you received a medicine to help you relax (sedative) during your procedure. Ask your health care provider when it is safe for you to drive.  Return to your normal activities as told by your health care provider. Ask your health care provider what activities are safe for you. General instructions   Take a warm bath to relieve any burning sensations around your urethra.  Hold a warm, damp washcloth over the urethral area to ease pain.  It is up to you to get the results of your procedure. Ask your health care provider, or the department that is doing the procedure, when your results will be ready.  Keep all follow-up visits as told by your health care provider. This is important. Contact a health care provider if:  You have a fever.  Your symptoms do not improve within 24 hours, and you continue to have: ? Burning during urination. ? Increasing amounts of blood in your urine. ? Pain during urination. ? An urgent need to urinate. ? A need to urinate more often than  usual. Get help right away if:  You have a lot of bleeding or more bleeding.  You have severe pain.  You are unable to urinate.  You have bright red blood in your urine.  You are passing blood clots in your urine.  You have a fever. Summary  After the procedure, it is common to have mild pain, burning with urination, and some blood.  Take medicines as told. If you were given antibiotics, finish all of it even if you start to feel better.  Rest after the procedure. Follow your health care provider's instructions for self care at home.  Contact a health care provider if your symptoms do not improve within 24 hours, or if you have more pain or more blood in your urine.  Get help right away if you have a lot of bleeding, severe pain, fever, or bright red blood or blood clots in the urine. This information is not intended to replace advice given to you by your health care provider. Make sure you discuss any questions you have with your health care provider. Document Revised: 10/02/2018 Document Reviewed: 10/02/2018 Elsevier Patient Education  [place] Anesthesia, Adult, Care After This sheet gives you information about how to care for yourself after your procedure. Your health care provider may also give you more specific instructions. If you have problems or questions, contact your health care provider. What can I expect after the procedure? After the procedure, the following side effects are  common:  Pain or discomfort at the IV site.  Nausea.  Vomiting.  Sore throat.  Trouble concentrating.  Feeling cold or chills.  Weak or tired.  Sleepiness and fatigue.  Soreness and body aches. These side effects can affect parts of the body that were not involved in surgery. Follow these instructions at home:  For at least 24 hours after the procedure:  Have a responsible adult stay with you. It is important to have someone help care for you until you are  awake and alert.  Rest as needed.  Do not: ? Participate in activities in which you could fall or become injured. ? Drive. ? Use heavy machinery. ? Drink alcohol. ? Take sleeping pills or medicines that cause drowsiness. ? Make important decisions or sign legal documents. ? Take care of children on your own. Eating and drinking  Follow any instructions from your health care provider about eating or drinking restrictions.  When you feel hungry, start by eating small amounts of foods that are soft and easy to digest (bland), such as toast. Gradually return to your regular diet.  Drink enough fluid to keep your urine pale yellow.  If you vomit, rehydrate by drinking water, juice, or clear broth. General instructions  If you have sleep apnea, surgery and certain medicines can increase your risk for breathing problems. Follow instructions from your health care provider about wearing your sleep device: ? Anytime you are sleeping, including during daytime naps. ? While taking prescription pain medicines, sleeping medicines, or medicines that make you drowsy.  Return to your normal activities as told by your health care provider. Ask your health care provider what activities are safe for you.  Take over-the-counter and prescription medicines only as told by your health care provider.  If you smoke, do not smoke without supervision.  Keep all follow-up visits as told by your health care provider. This is important. Contact a health care provider if:  You have nausea or vomiting that does not get better with medicine.  You cannot eat or drink without vomiting.  You have pain that does not get better with medicine.  You are unable to pass urine.  You develop a skin rash.  You have a fever.  You have redness around your IV site that gets worse. Get help right away if:  You have difficulty breathing.  You have chest pain.  You have blood in your urine or stool, or you vomit  blood. Summary  After the procedure, it is common to have a sore throat or nausea. It is also common to feel tired.  Have a responsible adult stay with you for the first 24 hours after general anesthesia. It is important to have someone help care for you until you are awake and alert.  When you feel hungry, start by eating small amounts of foods that are soft and easy to digest (bland), such as toast. Gradually return to your regular diet.  Drink enough fluid to keep your urine pale yellow.  Return to your normal activities as told by your health care provider. Ask your health care provider what activities are safe for you. This information is not intended to replace advice given to you by your health care provider. Make sure you discuss any questions you have with your health care provider. Document Revised: 03/30/2017 Document Reviewed: 11/10/2016 Elsevier Patient Education  [place].

## 2020-02-05 NOTE — Transfer of Care (Signed)
Immediate Anesthesia Transfer of Care Note  Patient: Julie Villa  Procedure(s) Performed: CYSTOSCOPY (N/A ) TRANSURETHRAL RESECTION OF BLADDER TUMOR (TURBT) (N/A )  Patient Location: PACU  Anesthesia Type:General  Level of Consciousness: awake, alert , oriented and patient cooperative  Airway & Oxygen Therapy: Patient Spontanous Breathing  Post-op Assessment: Report given to RN, Post -op Vital signs reviewed and stable and Patient moving all extremities  Post vital signs: Reviewed and stable  Last Vitals:  Vitals Value Taken Time  BP    Temp    Pulse 67 02/05/20 1327  Resp    SpO2 98 % 02/05/20 1327  Vitals shown include unvalidated device data.  Last Pain:  Vitals:   02/05/20 1111  TempSrc: Oral  PainSc: 0-No pain      Patients Stated Pain Goal: 7 (79/64/18 [id number])  Complications: No complications documented.

## 2020-02-05 NOTE — Anesthesia Postprocedure Evaluation (Signed)
Anesthesia Post Note  Patient: Troy Sine  Procedure(s) Performed: CYSTOSCOPY (N/A ) TRANSURETHRAL RESECTION OF BLADDER TUMOR (TURBT) (N/A )  Patient location during evaluation: PACU Anesthesia Type: General Level of consciousness: awake, oriented, awake and alert and patient cooperative Pain management: pain level controlled Vital Signs Assessment: post-procedure vital signs reviewed and stable Respiratory status: spontaneous breathing, respiratory function stable and nonlabored ventilation Cardiovascular status: blood pressure returned to baseline and stable Postop Assessment: no headache and no backache Anesthetic complications: no   No complications documented.   Last Vitals:  Vitals:   02/05/20 1111 02/05/20 1328  BP: 121/68 109/73  Pulse: (!) 59 66  Resp: 14 (!) 9  Temp: 36.6 C (!) (P) 36.3 C  SpO2: 100%     Last Pain:  Vitals:   02/05/20 1111  TempSrc: Oral  PainSc: 0-No pain                 Tacy Learn

## 2020-02-05 NOTE — Op Note (Signed)
.  Preoperative diagnosis: bladder tumor  Postoperative diagnosis: Same  Procedure: 1 cystoscopy 2. Transurethral resection of bladder tumor, small  Attending: Rosie Fate  Anesthesia: General  Estimated blood loss: Minimal  Drains: none  Specimens: bladder tumor  Antibiotics: ancef  Findings:  0.5cm papillary posterior wall tumor.  Ureteral orifices in normal anatomic location.   Indications: Patient is a 60 year old female with a history of bladder tumor found on office cystoscopy.  After discussing treatment options, they decided proceed with transurethral resection of a bladder tumor.  Procedure her in detail: The patient was brought to the operating room and a brief timeout was done to ensure correct patient, correct procedure, correct site.  General anesthesia was administered patient was placed in dorsal lithotomy position.  Their genitalia was then prepped and draped in usual sterile fashion.  A rigid 54 French cystoscope was passed in the urethra and the bladder.  Bladder was inspected and we noted a 0.5cm bladder tumor.  the ureteral orifices were in the normal orthotopic locations. Using the bipolar resectoscope we removed the bladder tumor down to the base. We then removed the bladder tumor chips and sent them for pathology. We then re-inspected the bladder and found no residula bleeding.  the bladder was then drained and this concluded the procedure which was well tolerated by patient.  Complications: None  Condition: Stable, extubated, transferred to PACU  Plan: Patient is to be discharged home and followup in 5 days for pathology discussion.

## 2020-02-06 ENCOUNTER — Encounter (HOSPITAL_COMMUNITY): Payer: Self-pay | Admitting: Urology

## 2020-02-09 LAB — SURGICAL PATHOLOGY

## 2020-02-11 DIAGNOSIS — M9902 Segmental and somatic dysfunction of thoracic region: Secondary | ICD-10-CM | POA: Diagnosis not present

## 2020-02-11 DIAGNOSIS — M9903 Segmental and somatic dysfunction of lumbar region: Secondary | ICD-10-CM | POA: Diagnosis not present

## 2020-02-11 DIAGNOSIS — M9904 Segmental and somatic dysfunction of sacral region: Secondary | ICD-10-CM | POA: Diagnosis not present

## 2020-02-11 DIAGNOSIS — M5136 Other intervertebral disc degeneration, lumbar region: Secondary | ICD-10-CM | POA: Diagnosis not present

## 2020-02-12 ENCOUNTER — Encounter: Payer: Self-pay | Admitting: Urology

## 2020-02-12 ENCOUNTER — Other Ambulatory Visit: Payer: Self-pay

## 2020-02-12 ENCOUNTER — Ambulatory Visit (INDEPENDENT_AMBULATORY_CARE_PROVIDER_SITE_OTHER): Payer: BC Managed Care – PPO | Admitting: Urology

## 2020-02-12 VITALS — BP 134/72 | HR 63 | Temp 97.9°F | Ht 65.5 in | Wt 168.0 lb

## 2020-02-12 DIAGNOSIS — N3000 Acute cystitis without hematuria: Secondary | ICD-10-CM | POA: Diagnosis not present

## 2020-02-12 DIAGNOSIS — C672 Malignant neoplasm of lateral wall of bladder: Secondary | ICD-10-CM

## 2020-02-12 LAB — URINALYSIS, ROUTINE W REFLEX MICROSCOPIC
Bilirubin, UA: NEGATIVE
Glucose, UA: NEGATIVE
Ketones, UA: NEGATIVE
Nitrite, UA: POSITIVE — AB
Protein,UA: NEGATIVE
Specific Gravity, UA: 1.015 (ref 1.005–1.030)
Urobilinogen, Ur: 0.2 mg/dL (ref 0.2–1.0)
pH, UA: 6.5 (ref 5.0–7.5)

## 2020-02-12 LAB — MICROSCOPIC EXAMINATION
Renal Epithel, UA: NONE SEEN /hpf
WBC, UA: >30 /hpf — AB (ref 0–5)

## 2020-02-12 MED ORDER — NITROFURANTOIN MONOHYD MACRO 100 MG PO CAPS: 100.0000 mg | ORAL_CAPSULE | Freq: Two times a day (BID) | ORAL | 0 refills | Status: DC

## 2020-02-12 NOTE — Progress Notes (Signed)
02/12/2020 9:33 AM   Julie Villa 10-Oct-1960 [id number]  Referring provider: Orpah Melter, MD [address] [place],  [place] [address]  followup bladder tumor resection  HPI: Julie Villa is a 59yo here for followup after bladder tumor resection. Pathology TaG1. Since the surgery she has had urgency, frequency. No dysuria. Urine is cloudy. UA is concerning for infection.    PMH: Past Medical History:  Diagnosis Date  . Anemia    hx of several yrs ago  . Bladder tumor   . Breast cancer ([place]) 2012   left  . Frequency of urination   . Hematuria   . History of left breast cancer dx 2012  --- oncologist-  dr Lindi Adie ([facility])-- per lov note, no recurrence   Left UIQ - Invasive ductial carcinoma, Invasive Lobular carcinoma w/ LCIS; Stage IA (T1b, N0, M0)--- s/p breast lumpectomy w/ snl dissection and radiation therapy completed 01-03-2011  . History of radiation therapy completed 01-03-2011   total 61Gy  , left breast   . History of radiation therapy 2012  . Personal history of chemotherapy   . Personal history of radiation therapy   . Wears glasses     Surgical History: Past Surgical History:  Procedure Laterality Date  . APPENDECTOMY  1987  . BREAST LUMPECTOMY Left 2012  . BREAST LUMPECTOMY WITH AXILLARY LYMPH NODE DISSECTION Left 09-21-2010  dr Margot Chimes  [facility]  . CATARACT EXTRACTION W/ INTRAOCULAR LENS IMPLANT Right 06/2016  . CYSTOSCOPY N/A 02/05/2020   Procedure: CYSTOSCOPY;  Surgeon: Cleon Gustin, MD;  Location: [facility];  Service: Urology;  Laterality: N/A;  . CYSTOSCOPY W/ RETROGRADES Bilateral 01/19/2017   Procedure: CYSTOSCOPY WITH RETROGRADE PYELOGRAM;  Surgeon: Cleon Gustin, MD;  Location: [facility];  Service: Urology;  Laterality: Bilateral;  . NASAL SINUS SURGERY  06/2008  . TRANSURETHRAL RESECTION OF BLADDER TUMOR N/A 01/19/2017   Procedure: TRANSURETHRAL RESECTION OF BLADDER TUMOR (TURBT);  Surgeon:  Cleon Gustin, MD;  Location: [facility];  Service: Urology;  Laterality: N/A;  . TRANSURETHRAL RESECTION OF BLADDER TUMOR N/A 11/29/2017   Procedure: TRANSURETHRAL RESECTION OF BLADDER TUMOR (TURBT);  Surgeon: Cleon Gustin, MD;  Location: [facility];  Service: Urology;  Laterality: N/A;  . TRANSURETHRAL RESECTION OF BLADDER TUMOR N/A 02/05/2020   Procedure: TRANSURETHRAL RESECTION OF BLADDER TUMOR (TURBT);  Surgeon: Cleon Gustin, MD;  Location: [facility];  Service: Urology;  Laterality: N/A;    Home Medications:  Allergies as of 02/12/2020      Reactions   Penicillins Rash   Sulfa Antibiotics Rash      Medication List       Accurate as of February 12, 2020  9:33 AM. If you have any questions, ask your nurse or doctor.        CALCIUM 600+D3 PO Take 1 tablet by mouth 2 (two) times daily.   Iron 325 (65 Fe) MG Tabs Take 65 mg by mouth daily.   Magnesium 400 MG Tabs Take 400 mg by mouth at bedtime.   oxyCODONE-acetaminophen 5-325 MG tablet Commonly known as: Percocet Take 1 tablet by mouth every 4 (four) hours as needed for severe pain.   PROBIOTIC-10 PO Take 1 capsule by mouth daily.   Psyllium 48.57 % Powd Take 0.5 Scoops by mouth daily.   solifenacin 10 MG tablet Commonly known as: VESICARE Take 10 mg by mouth every morning.       Allergies:  Allergies  Allergen Reactions  . Penicillins Rash  . Sulfa Antibiotics Rash    Family History: Family History  Problem Relation Age of Onset  . Cancer Mother        breast  . Hypertension Mother   . Hyperlipidemia Mother   . Diabetes Father   . Heart disease Father   . Hyperlipidemia Father   . Hypertension Father   . Diabetes Sister   . Hypertension Sister   . Hyperlipidemia Sister     Social History:  reports that she quit smoking about 38 years ago. Her smoking use included cigarettes. She quit after 3.00 years of use. She has never used smokeless tobacco. She  reports current alcohol use of about 1.0 standard drink of alcohol per week. She reports that she does not use drugs.  ROS: All other review of systems were reviewed and are negative except what is noted above in HPI  Physical Exam: BP 134/72   Pulse 63   Temp 97.9 F (36.6 C)   Ht 5' 5.5" (1.664 m)   Wt 168 lb (76.2 kg)   LMP 04/06/2011   BMI 27.53 kg/m   Constitutional:  Alert and oriented, No acute distress. HEENT: [place] AT, moist mucus membranes.  Trachea midline, no masses. Cardiovascular: No clubbing, cyanosis, or edema. Respiratory: Normal respiratory effort, no increased work of breathing. GI: Abdomen is soft, nontender, nondistended, no abdominal masses GU: No CVA tenderness.  Lymph: No cervical or inguinal lymphadenopathy. Skin: No rashes, bruises or suspicious lesions. Neurologic: Grossly intact, no focal deficits, moving all 4 extremities. Psychiatric: Normal mood and affect.  Laboratory Data: Lab Results  Component Value Date   WBC 5.1 02/03/2020   HGB 11.7 (L) 02/03/2020   HCT 35.3 (L) 02/03/2020   MCV 95.9 02/03/2020   PLT 229 02/03/2020    Lab Results  Component Value Date   CREATININE 0.84 02/03/2020    No results found for: PSA  No results found for: TESTOSTERONE  No results found for: HGBA1C  Urinalysis    Component Value Date/Time   APPEARANCEUR Clear 01/21/2020 0852   GLUCOSEU Negative 01/21/2020 0852   BILIRUBINUR Negative 01/21/2020 0852   PROTEINUR Negative 01/21/2020 0852   UROBILINOGEN negative (A) 10/22/2019 0930   NITRITE Negative 01/21/2020 0852   LEUKOCYTESUR Negative 01/21/2020 0852    Lab Results  Component Value Date   LABMICR Comment 01/21/2020    Pertinent Imaging:  No results found for this or any previous visit.  No results found for this or any previous visit.  No results found for this or any previous visit.  No results found for this or any previous visit.  No results found for this or any previous  visit.  No results found for this or any previous visit.  No results found for this or any previous visit.  No results found for this or any previous visit.   Assessment & Plan:    1. Cancer of lateral wall of urinary bladder (HCC) -I will see her back in 3 months for cystoscopy. - Urinalysis, Routine w reflex microscopic  2. Acute cystitis -Urine for culture, WIll call with results -macrobid BID for 7 days No follow-ups on file.  Nicolette Bang, MD  [facility] [place]

## 2020-02-12 NOTE — Patient Instructions (Signed)
Bladder Cancer  Bladder cancer is an abnormal growth of tissue in the bladder. The bladder is the balloon-like sac in the pelvis. It collects and stores urine that comes from the kidneys through the ureters. The bladder wall is made of layers. If cancer spreads into these layers and through the wall of the bladder, it becomes more difficult to treat. What are the causes? The cause of this condition is not known. What increases the risk? The following factors may make you more likely to develop this condition:  Smoking.  Workplace risks (occupational exposures), such as rubber, leather, textile, dyes, chemicals, and paint.  Being white.  Your age. Most people with bladder cancer are over the age of 55.  Being female.  Having chronic bladder inflammation.  Having a personal history of bladder cancer.  Having a family history of bladder cancer (heredity).  Having had chemotherapy or radiation therapy to the pelvis.  Having been exposed to arsenic. What are the signs or symptoms? Initial symptoms of this condition include:  Blood in the urine.  Painful urination.  Frequent bladder or urine infections.  Increase in urgency and frequency of urination. Advanced symptoms of this condition include:  Not being able to urinate.  Low back pain on one side.  Loss of appetite.  Weight loss.  Fatigue.  Swelling in the feet.  Bone pain. How is this diagnosed? This condition is diagnosed based on your medical history, a physical exam, urine tests, lab tests, imaging tests, and your symptoms. You may also have other tests or procedures done, such as:  A narrow tube being inserted into your bladder through your urethra (cystoscopy) in order to view the lining of your bladder for tumors.  A biopsy to sample the tumor to see if cancer is present. If cancer is present, it will then be staged to determine its severity and extent. Staging is an assessment of:  The size of the  tumor.  Whether the cancer has spread.  Where the cancer has spread. It is important to know how deeply into the bladder wall cancer has grown and whether cancer has spread to any other parts of your body. Staging may require blood tests or imaging tests, such as a CT scan, MRI, bone scan, or chest X-ray. How is this treated? Based on the stage of cancer, one treatment or a combination of treatments may be recommended. The most common forms of treatment are:  Surgery to remove the cancer. Procedures that may be done include transurethral resection and cystectomy.  Radiation therapy. This is high-energy X-rays or other particles. This is often used in combination with chemotherapy.  Chemotherapy. During this treatment, medicines are used to kill cancer cells.  Immunotherapy. This uses medicines to help your own immune system destroy cancer cells. Follow these instructions at home:  Take over-the-counter and prescription medicines only as told by your health care provider.  Maintain a healthy diet. Some of your treatments might affect your appetite.  Consider joining a support group. This may help you learn to cope with the stress of having bladder cancer.  Tell your cancer care team if you develop side effects. They may be able to recommend ways to relieve them.  Keep all follow-up visits as told by your health care provider. This is important. Where to find more information  American Cancer Society: www.cancer.org  National Cancer Institute (NCI): www.cancer.gov Contact a health care provider if:  You have symptoms of a urinary tract infection. These include: ?   Fever. ? Chills. ? Weakness. ? Muscle aches. ? Abdominal pain. ? Frequent and intense urge to urinate. ? Burning feeling in the bladder or urethra during urination. Get help right away if:  There is blood in your urine.  You cannot urinate.  You have severe pain or other symptoms that do not go  away. Summary  Bladder cancer is an abnormal growth of tissue in the bladder.  This condition is diagnosed based on your medical history, a physical exam, urine tests, lab tests, imaging tests, and your symptoms.  Based on the stage of cancer, surgery, chemotherapy, or a combination of treatments may be recommended.  Consider joining a support group. This may help you learn to cope with the stress of having bladder cancer. This information is not intended to replace advice given to you by your health care provider. Make sure you discuss any questions you have with your health care provider. Document Revised: 03/09/2017 Document Reviewed: 02/29/2016 Elsevier Patient Education  2020 Elsevier Inc.  

## 2020-02-12 NOTE — Progress Notes (Signed)
Urological Symptom Review  Patient is experiencing the following symptoms: Frequent urination Hard to postpone urination Get up at night to urinate Leakage of urine Urinary tract infection   Review of Systems  Gastrointestinal (upper)  : Negative for upper GI symptoms  Gastrointestinal (lower) : Diarrhea  Constitutional : Negative for symptoms  Skin: Negative for skin symptoms  Eyes: Negative for eye symptoms  Ear/Nose/Throat : Negative for Ear/Nose/Throat symptoms  Hematologic/Lymphatic: Negative for Hematologic/Lymphatic symptoms  Cardiovascular : Negative for cardiovascular symptoms  Respiratory : Negative for respiratory symptoms  Endocrine: Negative for endocrine symptoms  Musculoskeletal: Negative for musculoskeletal symptoms  Neurological: Negative for neurological symptoms  Psychologic: Negative for psychiatric symptoms

## 2020-02-18 LAB — URINE CULTURE

## 2020-02-19 NOTE — Progress Notes (Signed)
Results sent via my chart 

## 2020-02-25 ENCOUNTER — Other Ambulatory Visit: Payer: Self-pay

## 2020-02-25 ENCOUNTER — Other Ambulatory Visit: Payer: BC Managed Care – PPO

## 2020-02-25 ENCOUNTER — Telehealth: Payer: Self-pay

## 2020-02-25 DIAGNOSIS — N3000 Acute cystitis without hematuria: Secondary | ICD-10-CM

## 2020-02-25 LAB — URINALYSIS, ROUTINE W REFLEX MICROSCOPIC
Bilirubin, UA: NEGATIVE
Glucose, UA: NEGATIVE
Ketones, UA: NEGATIVE
Nitrite, UA: POSITIVE — AB
Protein,UA: NEGATIVE
Specific Gravity, UA: 1.015 (ref 1.005–1.030)
Urobilinogen, Ur: 0.2 mg/dL (ref 0.2–1.0)
pH, UA: 6.5 (ref 5.0–7.5)

## 2020-02-25 LAB — MICROSCOPIC EXAMINATION
Epithelial Cells (non renal): >10 /hpf — AB (ref 0–10)
Renal Epithel, UA: NONE SEEN /hpf
WBC, UA: >30 /hpf — AB (ref 0–5)

## 2020-02-25 MED ORDER — NITROFURANTOIN MONOHYD MACRO 100 MG PO CAPS: 100.0000 mg | ORAL_CAPSULE | Freq: Two times a day (BID) | ORAL | 0 refills | Status: DC

## 2020-02-25 NOTE — Telephone Encounter (Signed)
Pt called and made aware rx was sent in.

## 2020-02-25 NOTE — Telephone Encounter (Signed)
Pt called saying she got results of urine spec today and it was infected. Wanted to know if dr was sending in an abx. Told pt it was sent for cx and would call back when dr looked at result.

## 2020-02-27 ENCOUNTER — Inpatient Hospital Stay: Payer: BC Managed Care – PPO | Attending: Adult Health | Admitting: Adult Health

## 2020-02-27 ENCOUNTER — Other Ambulatory Visit: Payer: Self-pay

## 2020-02-27 ENCOUNTER — Encounter: Payer: Self-pay | Admitting: Adult Health

## 2020-02-27 VITALS — BP 114/68 | HR 64 | Temp 97.7°F | Resp 18 | Ht 65.5 in | Wt 168.0 lb

## 2020-02-27 DIAGNOSIS — Z8349 Family history of other endocrine, nutritional and metabolic diseases: Secondary | ICD-10-CM | POA: Insufficient documentation

## 2020-02-27 DIAGNOSIS — Z882 Allergy status to sulfonamides status: Secondary | ICD-10-CM | POA: Diagnosis not present

## 2020-02-27 DIAGNOSIS — Z833 Family history of diabetes mellitus: Secondary | ICD-10-CM | POA: Diagnosis not present

## 2020-02-27 DIAGNOSIS — Z923 Personal history of irradiation: Secondary | ICD-10-CM | POA: Diagnosis not present

## 2020-02-27 DIAGNOSIS — Z803 Family history of malignant neoplasm of breast: Secondary | ICD-10-CM | POA: Insufficient documentation

## 2020-02-27 DIAGNOSIS — Z880 Allergy status to penicillin: Secondary | ICD-10-CM | POA: Insufficient documentation

## 2020-02-27 DIAGNOSIS — Z1231 Encounter for screening mammogram for malignant neoplasm of breast: Secondary | ICD-10-CM | POA: Diagnosis not present

## 2020-02-27 DIAGNOSIS — Z9049 Acquired absence of other specified parts of digestive tract: Secondary | ICD-10-CM | POA: Diagnosis not present

## 2020-02-27 DIAGNOSIS — Z79811 Long term (current) use of aromatase inhibitors: Secondary | ICD-10-CM | POA: Insufficient documentation

## 2020-02-27 DIAGNOSIS — M85852 Other specified disorders of bone density and structure, left thigh: Secondary | ICD-10-CM | POA: Diagnosis not present

## 2020-02-27 DIAGNOSIS — Z87891 Personal history of nicotine dependence: Secondary | ICD-10-CM | POA: Diagnosis not present

## 2020-02-27 DIAGNOSIS — Z8249 Family history of ischemic heart disease and other diseases of the circulatory system: Secondary | ICD-10-CM | POA: Diagnosis not present

## 2020-02-27 DIAGNOSIS — Z853 Personal history of malignant neoplasm of breast: Secondary | ICD-10-CM | POA: Diagnosis not present

## 2020-02-27 DIAGNOSIS — M8580 Other specified disorders of bone density and structure, unspecified site: Secondary | ICD-10-CM | POA: Insufficient documentation

## 2020-02-27 NOTE — Progress Notes (Signed)
CLINIC:  Survivorship   REASON FOR VISIT:  Routine follow-up for history of breast cancer.   BRIEF ONCOLOGIC HISTORY:  Oncology History  Carcinoma of upper-inner quadrant of left female breast ([place])  09/21/2010 Surgery   Left breast lumpectomy invasive low grade ductal carcinoma 0.6 cm with low-grade DCIS invasive lobular cancer spent 0.3 cm grade 1; 2 sentinel nodes negative   11/09/2010 - 01/08/2011 Radiation Therapy   Adjuvant radiation (dates were estimated by the patient)   02/01/2011 - 01/2016 Anti-estrogen oral therapy   Tamoxifen 20 mg by mouth daily switched to anastrozole 1 mg daily February 2016 (after she became postmenopausal)    Miscellaneous   BCI testing sent and showed low likelihood of benefit of extending anti-estrogen therapy past 5 years      INTERVAL HISTORY:  Ms. Fussner presents to the [facility] today for routine follow-up for her history of breast cancer.  Overall, she reports feeling quite well.  Her most recent mammogram was completed on 07/07/2019 and showed no evidence of malignancy and breast density category B.  Her most recent bone density was completed on 01/04/2017 and showed very mild osteopenia with a t score of -1.1.    Lauria is doing quite well.  She did have another bladder tumor removed and she follows with Dr. Noah Delaine for this every 3 months.  She says no further treatment is required at this time for it.  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    Zykira has no new issues or health concerns today.     REVIEW OF SYSTEMS:  Review of Systems  Constitutional: Negative for appetite change, chills, fatigue, fever and unexpected weight change.  HENT:   Negative for hearing loss, lump/mass, mouth sores, sore throat and trouble swallowing.   Eyes: Negative for eye problems and icterus.  Respiratory: Negative for chest tightness, cough and shortness of breath.   Cardiovascular:  Negative for chest pain, leg swelling and palpitations.  Gastrointestinal: Negative for abdominal distention, abdominal pain, constipation, diarrhea, nausea and vomiting.  Endocrine: Negative for hot flashes.  Skin: Negative for itching and rash.  Neurological: Negative for dizziness, extremity weakness, headaches and numbness.  Hematological: Negative for adenopathy. Does not bruise/bleed easily.  Psychiatric/Behavioral: Negative for depression. The patient is not nervous/anxious.   Breast: Denies any new nodularity, masses, tenderness, nipple changes, or nipple discharge.       PAST MEDICAL/SURGICAL HISTORY:  Past Medical History:  Diagnosis Date  . Anemia    hx of several yrs ago  . Bladder tumor   . Breast cancer ([place]) 2012   left  . Frequency of urination   . Hematuria   . History of left breast cancer dx 2012  --- oncologist-  dr Lindi Adie ([facility])-- per lov note, no recurrence   Left UIQ - Invasive ductial carcinoma, Invasive Lobular carcinoma w/ LCIS; Stage IA (T1b, N0, M0)--- s/p breast lumpectomy w/ snl dissection and radiation therapy completed 01-03-2011  . History of radiation therapy completed 01-03-2011   total 61Gy  , left breast   . History of radiation therapy 2012  . Personal history of chemotherapy   . Personal history of radiation therapy   . Wears glasses    Past Surgical History:  Procedure Laterality Date  . APPENDECTOMY  1987  . BREAST LUMPECTOMY Left 2012  . BREAST LUMPECTOMY WITH AXILLARY LYMPH NODE DISSECTION Left 09-21-2010  dr Margot Chimes  [facility]  .  CATARACT EXTRACTION W/ INTRAOCULAR LENS IMPLANT Right 06/2016  . CYSTOSCOPY N/A 02/05/2020   Procedure: CYSTOSCOPY;  Surgeon: Cleon Gustin, MD;  Location: [facility];  Service: Urology;  Laterality: N/A;  . CYSTOSCOPY W/ RETROGRADES Bilateral 01/19/2017   Procedure: CYSTOSCOPY WITH RETROGRADE PYELOGRAM;  Surgeon: Cleon Gustin, MD;  Location: [facility];  Service: Urology;   Laterality: Bilateral;  . NASAL SINUS SURGERY  06/2008  . TRANSURETHRAL RESECTION OF BLADDER TUMOR N/A 01/19/2017   Procedure: TRANSURETHRAL RESECTION OF BLADDER TUMOR (TURBT);  Surgeon: Cleon Gustin, MD;  Location: [facility];  Service: Urology;  Laterality: N/A;  . TRANSURETHRAL RESECTION OF BLADDER TUMOR N/A 11/29/2017   Procedure: TRANSURETHRAL RESECTION OF BLADDER TUMOR (TURBT);  Surgeon: Cleon Gustin, MD;  Location: [facility];  Service: Urology;  Laterality: N/A;  . TRANSURETHRAL RESECTION OF BLADDER TUMOR N/A 02/05/2020   Procedure: TRANSURETHRAL RESECTION OF BLADDER TUMOR (TURBT);  Surgeon: Cleon Gustin, MD;  Location: [facility];  Service: Urology;  Laterality: N/A;     ALLERGIES:  Allergies  Allergen Reactions  . Penicillins Rash  . Sulfa Antibiotics Rash     CURRENT MEDICATIONS:  Outpatient Encounter Medications as of 02/27/2020  Medication Sig  . Calcium Carbonate-Vitamin D (CALCIUM 600+D3 PO) Take 1 tablet by mouth 2 (two) times daily.  . Ferrous Sulfate (IRON) 325 (65 Fe) MG TABS Take 65 mg by mouth daily.   . Magnesium 400 MG TABS Take 400 mg by mouth at bedtime.   . nitrofurantoin, macrocrystal-monohydrate, (MACROBID) 100 MG capsule Take 1 capsule (100 mg total) by mouth every 12 (twelve) hours.  . Probiotic Product (PROBIOTIC-10 PO) Take 1 capsule by mouth daily.   . Psyllium 48.57 % POWD Take 0.5 Scoops by mouth daily.   . solifenacin (VESICARE) 10 MG tablet Take 10 mg by mouth every morning.   . [DISCONTINUED] oxyCODONE-acetaminophen (PERCOCET) 5-325 MG tablet Take 1 tablet by mouth every 4 (four) hours as needed for severe pain. (Patient not taking: Reported on 02/12/2020)   No facility-administered encounter medications on file as of 02/27/2020.     ONCOLOGIC FAMILY HISTORY:  Family History  Problem Relation Age of Onset  . Cancer Mother        breast  . Hypertension Mother   . Hyperlipidemia Mother   .  Diabetes Father   . Heart disease Father   . Hyperlipidemia Father   . Hypertension Father   . Diabetes Sister   . Hypertension Sister   . Hyperlipidemia Sister       SOCIAL HISTORY:  Social History   Socioeconomic History  . Marital status: Married    Spouse name: Not on file  . Number of children: Not on file  . Years of education: Not on file  . Highest education level: Not on file  Occupational History  . Not on file  Tobacco Use  . Smoking status: Former Smoker    Years: 3.00    Types: Cigarettes    Quit date: 01/17/1982    Years since quitting: 38.1  . Smokeless tobacco: Never Used  Vaping Use  . Vaping Use: Never used  Substance and Sexual Activity  . Alcohol use: Yes    Alcohol/week: 1.0 standard drink    Types: 1 Glasses of wine per week    Comment: glass wine couple times a month  . Drug use: No  . Sexual activity: Yes  Other Topics Concern  . Not on file  Social History Narrative  . Not on file   Social Determinants of Health   Financial Resource Strain:   . Difficulty of Paying Living Expenses: Not on file  Food Insecurity:   . Worried About Charity fundraiser in the Last Year: Not on file  . Ran Out of Food in the Last Year: Not on file  Transportation Needs:   . Lack of Transportation (Medical): Not on file  . Lack of Transportation (Non-Medical): Not on file  Physical Activity:   . Days of Exercise per Week: Not on file  . Minutes of Exercise per Session: Not on file  Stress:   . Feeling of Stress : Not on file  Social Connections:   . Frequency of Communication with Friends and Family: Not on file  . Frequency of Social Gatherings with Friends and Family: Not on file  . Attends Religious Services: Not on file  . Active Member of Clubs or Organizations: Not on file  . Attends Archivist Meetings: Not on file  . Marital Status: Not on file  Intimate Partner Violence:   . Fear of Current or Ex-Partner: Not on file  .  Emotionally Abused: Not on file  . Physically Abused: Not on file  . Sexually Abused: Not on file        PHYSICAL EXAMINATION:  Vital Signs: Vitals:   02/27/20 0901  BP: 114/68  Pulse: 64  Resp: 18  Temp: 97.7 F (36.5 C)  SpO2: 100%   Filed Weights   02/27/20 0901  Weight: 168 lb (76.2 kg)   General: Well-nourished, well-appearing female in no acute distress.  Unaccompanied today.   HEENT: Head is normocephalic.  Pupils equal and reactive to light. Conjunctivae clear without exudate.  Sclerae anicteric. Oral mucosa is pink, moist.  Oropharynx is pink without lesions or erythema.  Lymph: No cervical, supraclavicular, or infraclavicular lymphadenopathy noted on palpation.  Cardiovascular: Regular rate and rhythm.Marland Kitchen Respiratory: Clear to auscultation bilaterally. Chest expansion symmetric; breathing non-labored.  Breast Exam:  -Left breast: No appreciable masses on palpation. No skin redness, thickening, or peau d'orange appearance; no nipple retraction or nipple discharge; mild distortion in symmetry at previous lumpectomy site well healed scar without erythema or nodularity.  -Right breast: No appreciable masses on palpation. No skin redness, thickening, or peau d'orange appearance; no nipple retraction or nipple discharge -Axilla: No axillary adenopathy bilaterally.  GI: Abdomen soft and round; non-tender, non-distended. Bowel sounds normoactive. No hepatosplenomegaly.   GU: Deferred.  Neuro: No focal deficits. Steady gait.  Psych: Mood and affect normal and appropriate for situation.  MSK: No focal spinal tenderness to palpation, full range of motion in bilateral upper extremities Extremities: No edema. Skin: Warm and dry.  LABORATORY DATA:  None for this visit   DIAGNOSTIC IMAGING:  Most recent mammogram:   CLINICAL DATA:  Screening. Prior malignant lumpectomy of the LEFT breast in 2012.  EXAM: DIGITAL SCREENING BILATERAL MAMMOGRAM WITH CAD  COMPARISON:   Previous exam(s).  ACR Breast Density Category b: There are scattered areas of fibroglandular density.  FINDINGS: There are no findings suspicious for malignancy. Stable post lumpectomy changes involving the LEFT breast. Images were processed with CAD.  IMPRESSION: No mammographic evidence of malignancy. A result letter of this screening mammogram will be mailed directly to the patient.  RECOMMENDATION: Screening mammogram in one year. (Code:SM-B-01Y)  BI-RADS CATEGORY  2: Benign.   Electronically Signed   By: Evangeline Dakin M.D.   On: 07/07/2019 08:00  ASSESSMENT AND PLAN:  Ms.. Montero is a pleasant 59 y.o. female with history of Stage IA left breast invasive ductal carcinoma, ER+/PR+/HER2-, diagnosed in 09/2010, treated with lumpectomy, adjuvant radiation therapy, and anti-estrogen therapy with Tamoxifen followed by Anastrozole completed in 01/2016.  [facility] for [facility] and routine follow-up.   1. History of breast cancer:  Ms. Nauman is currently clinically and radiographically without evidence of disease or recurrence of breast cancer. She will be due for mammogram in 06/2020.  Her breast density is category B.  She will return in one year for LTS follow up.   I encouraged her to call me with any questions or concerns before her next visit at the cancer center, and I would be happy to see her sooner, if needed.    2. Bone health:  Given Ms. Salsbury's age, history of breast cancer, and her previous anti-estrogen therapy with Anastrozole, she is at risk for bone demineralization.  Her most recent bone density in 2018 showed osteopenia.  I have placed orders for her to undergo another bone density test to evaluate in March when she undergoes her mammogram.    3. Cancer screening:  Due to Ms. Kochanski's history and her age, she should receive screening for skin cancers, colon cancer, and gynecologic cancers.  She is up to date with these. She was  encouraged to follow-up with her PCP for appropriate cancer screenings.   4. Health maintenance and wellness promotion: Ms. Ehresman was encouraged to consume 5-7 servings of fruits and vegetables per day. She was also encouraged to engage in moderate to vigorous exercise for 30 minutes per day most days of the week. She was instructed to limit her alcohol consumption and continue to abstain from tobacco use.    Dispo:  -Return to cancer center in one year for LTS follow up -Mammogram in 06/2020 -Bone density in 06/2020   A total of (20) minutes of face-to-face time was spent with this patient with greater than 50% of that time in counseling and care-coordination.   Gardenia Phlegm, [place] [phone]   Note: PRIMARY CARE PROVIDER Orpah Melter, [place] [phone]

## 2020-03-02 LAB — URINE CULTURE

## 2020-03-08 ENCOUNTER — Other Ambulatory Visit: Payer: Self-pay

## 2020-03-08 ENCOUNTER — Telehealth: Payer: Self-pay

## 2020-03-08 ENCOUNTER — Other Ambulatory Visit: Payer: BC Managed Care – PPO

## 2020-03-08 DIAGNOSIS — N3000 Acute cystitis without hematuria: Secondary | ICD-10-CM

## 2020-03-08 NOTE — Telephone Encounter (Signed)
Pt called and made aware to drop off urine specimen per Dr. Alyson Ingles

## 2020-03-08 NOTE — Telephone Encounter (Signed)
Have her drop off another urine

## 2020-03-09 ENCOUNTER — Telehealth: Payer: Self-pay

## 2020-03-09 ENCOUNTER — Other Ambulatory Visit: Payer: Self-pay

## 2020-03-09 ENCOUNTER — Other Ambulatory Visit: Payer: BC Managed Care – PPO

## 2020-03-09 DIAGNOSIS — N3000 Acute cystitis without hematuria: Secondary | ICD-10-CM | POA: Diagnosis not present

## 2020-03-09 LAB — MICROSCOPIC EXAMINATION: Renal Epithel, UA: NONE SEEN /hpf

## 2020-03-09 LAB — URINALYSIS, ROUTINE W REFLEX MICROSCOPIC
Bilirubin, UA: NEGATIVE
Glucose, UA: NEGATIVE
Ketones, UA: NEGATIVE
Nitrite, UA: POSITIVE — AB
Protein,UA: NEGATIVE
RBC, UA: NEGATIVE
Specific Gravity, UA: <1.005 — ABNORMAL LOW (ref 1.005–1.030)
Urobilinogen, Ur: 0.2 mg/dL (ref 0.2–1.0)
pH, UA: 5.5 (ref 5.0–7.5)

## 2020-03-09 MED ORDER — CEFPODOXIME PROXETIL 200 MG PO TABS: 200.0000 mg | ORAL_TABLET | Freq: Two times a day (BID) | ORAL | 0 refills | Status: DC

## 2020-03-09 NOTE — Telephone Encounter (Signed)
Urine specimen from patient on 03/08/2020 was not completed by lab.   Pt aware and will come by office again this morning to leave another sample.

## 2020-03-09 NOTE — Progress Notes (Signed)
Pt called and made aware

## 2020-03-10 DIAGNOSIS — M9904 Segmental and somatic dysfunction of sacral region: Secondary | ICD-10-CM | POA: Diagnosis not present

## 2020-03-10 DIAGNOSIS — M9903 Segmental and somatic dysfunction of lumbar region: Secondary | ICD-10-CM | POA: Diagnosis not present

## 2020-03-10 DIAGNOSIS — M5136 Other intervertebral disc degeneration, lumbar region: Secondary | ICD-10-CM | POA: Diagnosis not present

## 2020-03-10 DIAGNOSIS — M9902 Segmental and somatic dysfunction of thoracic region: Secondary | ICD-10-CM | POA: Diagnosis not present

## 2020-03-12 ENCOUNTER — Telehealth: Payer: Self-pay

## 2020-03-12 DIAGNOSIS — N3000 Acute cystitis without hematuria: Secondary | ICD-10-CM

## 2020-03-12 LAB — URINE CULTURE

## 2020-03-12 MED ORDER — NITROFURANTOIN MONOHYD MACRO 100 MG PO CAPS: 100.0000 mg | ORAL_CAPSULE | Freq: Two times a day (BID) | ORAL | 0 refills | Status: DC

## 2020-03-12 NOTE — Telephone Encounter (Signed)
-----   Message from Cleon Gustin, MD sent at 03/12/2020  3:30 PM EST ----- Macrobid 100mg  BID for 7 days ----- Message ----- From: Iris Pert, LPN Sent: 11/09/4479   1:42 PM EST To: Cleon Gustin, MD  Pt started on [facility] 11/30

## 2020-03-12 NOTE — Telephone Encounter (Signed)
Pt called and made aware

## 2020-05-12 ENCOUNTER — Ambulatory Visit (INDEPENDENT_AMBULATORY_CARE_PROVIDER_SITE_OTHER): Payer: 59 | Admitting: Urology

## 2020-05-12 ENCOUNTER — Other Ambulatory Visit: Payer: Self-pay

## 2020-05-12 ENCOUNTER — Encounter: Payer: Self-pay | Admitting: Urology

## 2020-05-12 VITALS — BP 125/76 | HR 71 | Temp 98.2°F | Ht 65.5 in | Wt 168.0 lb

## 2020-05-12 DIAGNOSIS — C672 Malignant neoplasm of lateral wall of bladder: Secondary | ICD-10-CM | POA: Diagnosis not present

## 2020-05-12 LAB — URINALYSIS, ROUTINE W REFLEX MICROSCOPIC
Bilirubin, UA: NEGATIVE
Glucose, UA: NEGATIVE
Ketones, UA: NEGATIVE
Leukocytes,UA: NEGATIVE
Nitrite, UA: NEGATIVE
Protein,UA: NEGATIVE
RBC, UA: NEGATIVE
Specific Gravity, UA: 1.01 (ref 1.005–1.030)
Urobilinogen, Ur: 0.2 mg/dL (ref 0.2–1.0)
pH, UA: 7.5 (ref 5.0–7.5)

## 2020-05-12 MED ORDER — CIPROFLOXACIN HCL 500 MG PO TABS
500.0000 mg | ORAL_TABLET | Freq: Once | ORAL | Status: AC
  Administered 2020-05-12: 500 mg via ORAL

## 2020-05-12 NOTE — Patient Instructions (Signed)
Bladder Cancer  Bladder cancer is a condition in which abnormal tissue (a tumor) grows in the bladder. The bladder is the organ that holds urine. Two tubes (ureters) carry the urine from the kidneys to the bladder. The bladder wall is made of layers of tissue. Cancer that spreads through these layers of the bladder wall becomes more difficult to treat. What are the causes? The cause of this condition is not known. What increases the risk? The following factors may make you more likely to develop this condition:  Smoking.  Working where there are risks (occupational exposures), such as working with rubber, leather, clothing fabric, dyes, chemicals, and paint.  Being 55 years of age or older.  Being female.  Having bladder inflammation that is long-term (chronic).  Having a history of cancer, including: ? A family history of bladder cancer. ? Personal experience with bladder cancer. ? Having had certain treatments for cancer before. These include:  Medicines to kill cancer cells (chemotherapy).  Strong X-ray beams or capsules high in energy to kill cancer cells and shrink tumors (radiation therapy).  Having been exposed to arsenic. This is a chemical element that can poison you. What are the signs or symptoms? Early symptoms of this condition include:  Seeing blood in your urine.  Feeling pain when urinating.  Having infections of your urinary system (urinary tract infections or UTIs) that happen often.  Having to urinate sooner or more often than usual. Later symptoms of this condition include:  Not being able to urinate.  Pain on one side of your lower back.  Loss of appetite.  Weight loss.  Tiredness (fatigue).  Swelling in your feet.  Bone pain. How is this diagnosed? This condition is diagnosed based on:  Your medical history.  A physical exam.  Lab tests, such as urine tests.  Imaging tests.  Your symptoms. You may also have other tests or  procedures done, such as:  A cystoscopy. A narrow tube is inserted into your bladder through the organ that connects your bladder to the outside of your body (urethra). This is done to view the lining of your bladder for tumors.  A biopsy. This procedure involves removing a tissue sample to look at it under a microscope to see if cancer is present. It is important to find out:  How deeply into the bladder wall cancer has grown.  Whether cancer has spread to any other parts of your body. This may require blood tests or imaging tests, such as a CT scan, MRI, bone scan, or X-rays. How is this treated? Your health care provider may recommend one or more types of treatment based on the stage of your cancer. The most common types of treatment are:  Surgery to remove the cancer. Procedures that may be done include: ? Removing a tumor on the inside wall of the bladder (transurethral resection). ? Removing the bladder (cystectomy).  Radiation therapy. This is often used together with chemotherapy.  Chemotherapy.  Immunotherapy. This uses medicines to help your immune system destroy cancer cells. Follow these instructions at home:  Take over-the-counter and prescription medicines only as told by your health care provider.  Eat a healthy diet. Some of your treatments might affect your appetite.  Do not use any products that contain nicotine or tobacco, such as cigarettes, e-cigarettes, and chewing tobacco. If you need help quitting, ask your health care provider.  Consider joining a support group. This may help you learn to cope with the stress of having   bladder cancer.  Tell your cancer care team if you develop side effects. Your team may be able to recommend ways to get relief.  Keep all follow-up visits as told by your health care provider. This is important. Where to find more information  American Cancer Society: www.cancer.org  National Cancer Institute (NCI):  www.cancer.gov Contact a health care provider if:  You have symptoms of a urinary tract infection. These include: ? Fever. ? Chills. ? Weakness. ? Muscle aches. ? Pain in your abdomen. ? Urge to urinate that is stronger and happens more often than usual. ? Burning feeling in the bladder or urethra when you urinate. Get help right away if:  There is blood in your urine.  You cannot urinate.  You have severe pain or other symptoms that do not go away. Summary  Bladder cancer is a condition in which tumors grow in the bladder and cause illness.  This condition is diagnosed based on your medical history, a physical exam, lab tests, imaging tests, and your symptoms.  Your health care provider may recommend one or more types of treatment based on the stage of your cancer.  Consider joining a support group. This may help you learn to cope with the stress of having bladder cancer. This information is not intended to replace advice given to you by your health care provider. Make sure you discuss any questions you have with your health care provider. Document Revised: 12/04/2018 Document Reviewed: 12/04/2018 Elsevier Patient Education  2021 Elsevier Inc.  

## 2020-05-12 NOTE — Progress Notes (Signed)
Urological Symptom Review  Patient is experiencing the following symptoms: Frequent urination Get up at night to urinate Leakage of urine   Review of Systems  Gastrointestinal (upper)  : Negative for upper GI symptoms  Gastrointestinal (lower) : Negative for lower GI symptoms  Constitutional : Negative for symptoms  Skin: Negative for skin symptoms  Eyes: Negative for eye symptoms  Ear/Nose/Throat : Negative for Ear/Nose/Throat symptoms  Hematologic/Lymphatic: Negative for Hematologic/Lymphatic symptoms  Cardiovascular : Negative for cardiovascular symptoms  Respiratory : Negative for respiratory symptoms  Endocrine: Negative for endocrine symptoms  Musculoskeletal: Negative for musculoskeletal symptoms  Neurological: Negative for neurological symptoms  Psychologic: Negative for psychiatric symptoms

## 2020-05-12 NOTE — Progress Notes (Signed)
   05/12/20  CC: hx of low grade bladder cancer  HPI: Ms Ciampa is a 60yo here for followup for low grade bladder cancer. No new LUTS. No hematuria or dysuria Blood pressure 125/76, pulse 71, temperature 98.2 F (36.8 C), height 5' 5.5" (1.664 m), weight 168 lb (76.2 kg), last menstrual period 04/06/2011. NED. A&Ox3.   No respiratory distress   Abd soft, NT, ND Normal external genitalia with patent urethral meatus  Cystoscopy Procedure Note  Patient identification was confirmed, informed consent was obtained, and patient was prepped using Betadine solution.  Lidocaine jelly was administered per urethral meatus.    Procedure: - Flexible cystoscope introduced, without any difficulty.   - Thorough search of the bladder revealed:    normal urethral meatus    normal urothelium    no stones    no ulcers     no tumors    no urethral polyps    no trabeculation  - Ureteral orifices were normal in position and appearance.  Post-Procedure: - Patient tolerated the procedure well  Assessment/ Plan: RTC 6 months for cystoscopy   No follow-ups on file.  Nicolette Bang, MD

## 2020-07-06 ENCOUNTER — Other Ambulatory Visit: Payer: Self-pay

## 2020-07-06 ENCOUNTER — Ambulatory Visit
Admission: RE | Admit: 2020-07-06 | Discharge: 2020-07-06 | Disposition: A | Discharge status: Home / Self Care | Payer: 59 | Source: Ambulatory Visit | Attending: Adult Health | Admitting: Adult Health

## 2020-07-06 DIAGNOSIS — Z853 Personal history of malignant neoplasm of breast: Secondary | ICD-10-CM

## 2020-07-06 DIAGNOSIS — Z1231 Encounter for screening mammogram for malignant neoplasm of breast: Secondary | ICD-10-CM

## 2020-09-03 ENCOUNTER — Ambulatory Visit
Admission: RE | Admit: 2020-09-03 | Discharge: 2020-09-03 | Disposition: A | Discharge status: Home / Self Care | Payer: 59 | Source: Ambulatory Visit | Attending: Adult Health | Admitting: Adult Health

## 2020-09-03 ENCOUNTER — Other Ambulatory Visit: Payer: Self-pay

## 2020-09-03 DIAGNOSIS — M85852 Other specified disorders of bone density and structure, left thigh: Secondary | ICD-10-CM

## 2020-11-10 ENCOUNTER — Ambulatory Visit: Payer: 59 | Admitting: Urology

## 2020-11-10 ENCOUNTER — Encounter: Payer: Self-pay | Admitting: Urology

## 2020-11-10 ENCOUNTER — Other Ambulatory Visit: Payer: Self-pay

## 2020-11-10 VITALS — BP 101/66 | HR 55

## 2020-11-10 DIAGNOSIS — C672 Malignant neoplasm of lateral wall of bladder: Secondary | ICD-10-CM | POA: Diagnosis not present

## 2020-11-10 LAB — URINALYSIS, ROUTINE W REFLEX MICROSCOPIC
Bilirubin, UA: NEGATIVE
Glucose, UA: NEGATIVE
Ketones, UA: NEGATIVE
Leukocytes,UA: NEGATIVE
Nitrite, UA: NEGATIVE
Protein,UA: NEGATIVE
RBC, UA: NEGATIVE
Specific Gravity, UA: 1.015 (ref 1.005–1.030)
Urobilinogen, Ur: 0.2 mg/dL (ref 0.2–1.0)
pH, UA: 7 (ref 5.0–7.5)

## 2020-11-10 MED ORDER — CIPROFLOXACIN HCL 500 MG PO TABS
500.0000 mg | ORAL_TABLET | Freq: Once | ORAL | Status: AC
  Administered 2020-11-10: 500 mg via ORAL

## 2020-11-10 NOTE — Progress Notes (Signed)
   11/10/20  CC: Followup low grade bladder cancer   HPI: [name] is a 60yo here for followup for low grade bladder cancer. No new LUTS. No hematuria Blood pressure 101/66, pulse (!) 55, last menstrual period 04/06/2011. NED. A&Ox3.   No respiratory distress   Abd soft, NT, ND Normal external genitalia with patent urethral meatus  Cystoscopy Procedure Note  Patient identification was confirmed, informed consent was obtained, and patient was prepped using Betadine solution.  Lidocaine jelly was administered per urethral meatus.    Procedure: - Flexible cystoscope introduced, without any difficulty.   - Thorough search of the bladder revealed:    normal urethral meatus    normal urothelium    no stones    no ulcers     no tumors    no urethral polyps    no trabeculation  - Ureteral orifices were normal in position and appearance.  Post-Procedure: - Patient tolerated the procedure well  Assessment/ Plan: RTC 6 months for cystoscopy   No follow-ups on file.  Julie Bang, MD

## 2020-11-10 NOTE — Progress Notes (Signed)

## 2020-11-10 NOTE — Patient Instructions (Signed)
Bladder Cancer Bladder cancer is a condition in which abnormal tissue (a tumor) grows in the bladder. The bladder is the organ that holds urine. Two tubes (ureters) carry the urine from the kidneys to the bladder. The bladder wall is made of layers of tissue. Cancer that spreads through these layers of the bladder wall becomes more difficult to treat. What are the causes? The cause of this condition is not known. What increases the risk? The following factors may make you more likely to develop this condition: Smoking. Working where there are risks (occupational exposures), such as working with rubber, leather, clothing fabric, dyes, chemicals, and paint. Being 55 years of age or older. Being female. Having bladder inflammation that is long-term (chronic). Having a history of cancer, including: A family history of bladder cancer. Personal experience with bladder cancer. Having had certain treatments for cancer before. These include: Medicines to kill cancer cells (chemotherapy). Strong X-ray beams or capsules high in energy to kill cancer cells and shrink tumors (radiation therapy). Having been exposed to arsenic. This is a chemical element that can poison you. What are the signs or symptoms? Early symptoms of this condition include: Seeing blood in your urine. Feeling pain when urinating. Having infections of your urinary system (urinary tract infections or UTIs) that happen often. Having to urinate sooner or more often than usual. Later symptoms of this condition include: Not being able to urinate. Pain on one side of your lower back. Loss of appetite. Weight loss. Tiredness (fatigue). Swelling in your feet. Bone pain. How is this diagnosed? This condition is diagnosed based on: Your medical history. A physical exam. Lab tests, such as urine tests. Imaging tests. Your symptoms. You may also have other tests or procedures done, such as: A cystoscopy. A narrow tube is inserted  into your bladder through the organ that connects your bladder to the outside of your body (urethra). This is done to view the lining of your bladder for tumors. A biopsy. This procedure involves removing a tissue sample to look at it under a microscope to see if cancer is present. It is important to find out: How deeply into the bladder wall cancer has grown. Whether cancer has spread to any other parts of your body. This may require blood tests or imaging tests, such as a CT scan, MRI, bone scan, or X-rays. How is this treated? Your health care provider may recommend one or more types of treatment based on the stage of your cancer. The most common types of treatment are: Surgery to remove the cancer. Procedures that may be done include: Removing a tumor on the inside wall of the bladder (transurethral resection). Removing the bladder (cystectomy). Radiation therapy. This is often used together with chemotherapy. Chemotherapy. Immunotherapy. This uses medicines to help your immune system destroy cancer cells. Follow these instructions at home: Take over-the-counter and prescription medicines only as told by your health care provider. Eat a healthy diet. Some of your treatments might affect your appetite. Do not use any products that contain nicotine or tobacco, such as cigarettes, e-cigarettes, and chewing tobacco. If you need help quitting, ask your health care provider. Consider joining a support group. This may help you learn to cope with the stress of having bladder cancer. Tell your cancer care team if you develop side effects. Your team may be able to recommend ways to get relief. Keep all follow-up visits as told by your health care provider. This is important. Where to find more information American   Cancer Society: www.cancer.org National Cancer Institute (NCI): www.cancer.gov Contact a health care provider if: You have symptoms of a urinary tract infection. These  include: Fever. Chills. Weakness. Muscle aches. Pain in your abdomen. Urge to urinate that is stronger and happens more often than usual. Burning feeling in the bladder or urethra when you urinate. Get help right away if: There is blood in your urine. You cannot urinate. You have severe pain or other symptoms that do not go away. Summary Bladder cancer is a condition in which tumors grow in the bladder and cause illness. This condition is diagnosed based on your medical history, a physical exam, lab tests, imaging tests, and your symptoms. Your health care provider may recommend one or more types of treatment based on the stage of your cancer. Consider joining a support group. This may help you learn to cope with the stress of having bladder cancer. This information is not intended to replace advice given to you by your health care provider. Make sure you discuss any questions you have with your health care provider. Document Revised: 12/04/2018 Document Reviewed: 12/04/2018 Elsevier Patient Education  2022 Elsevier Inc.  

## 2020-12-27 ENCOUNTER — Telehealth: Payer: Self-pay

## 2020-12-27 NOTE — Telephone Encounter (Signed)
Patient called office today with UTI symptoms. Patient out of town and will go to Urgent care for treatment but wanted MD to know. Message sent to MD.

## 2021-01-10 ENCOUNTER — Telehealth: Payer: Self-pay

## 2021-01-10 NOTE — Telephone Encounter (Signed)
Patient is currently out of town but has been treated 2 times in 1 month for UTI.  Patient scheduled to see MD when returns in town for recurrent UTI

## 2021-01-26 ENCOUNTER — Encounter: Payer: Self-pay | Admitting: Urology

## 2021-01-26 ENCOUNTER — Other Ambulatory Visit: Payer: Self-pay

## 2021-01-26 ENCOUNTER — Ambulatory Visit: Payer: 59 | Admitting: Urology

## 2021-01-26 VITALS — BP 129/79 | HR 73 | Temp 97.3°F | Wt 159.0 lb

## 2021-01-26 DIAGNOSIS — N3021 Other chronic cystitis with hematuria: Secondary | ICD-10-CM | POA: Diagnosis not present

## 2021-01-26 DIAGNOSIS — N3000 Acute cystitis without hematuria: Secondary | ICD-10-CM

## 2021-01-26 DIAGNOSIS — C672 Malignant neoplasm of lateral wall of bladder: Secondary | ICD-10-CM

## 2021-01-26 NOTE — Patient Instructions (Signed)

## 2021-01-26 NOTE — Progress Notes (Signed)
Urological Symptom Review  Patient is experiencing the following symptoms: Frequent urination Get up at night to urinate Urinary tract infection   Review of Systems  Gastrointestinal (upper)  : Negative for upper GI symptoms  Gastrointestinal (lower) : Negative for lower GI symptoms  Constitutional : Negative for symptoms  Skin: Negative for skin symptoms  Eyes: Negative for eye symptoms  Ear/Nose/Throat : Negative for Ear/Nose/Throat symptoms  Hematologic/Lymphatic: Negative for Hematologic/Lymphatic symptoms  Cardiovascular : Negative for cardiovascular symptoms  Respiratory : Negative for respiratory symptoms  Endocrine: Negative for endocrine symptoms  Musculoskeletal: Negative for musculoskeletal symptoms  Neurological: Negative for neurological symptoms  Psychologic: Negative for psychiatric symptoms

## 2021-01-26 NOTE — Progress Notes (Signed)
01/26/2021 2:35 PM   Troy Sine 1960-06-17 [id number]  Referring provider: Orpah Melter, MD [address] [place],  [place] [address]  Recurrent UTI   HPI: Julie Villa is a 60yo here for evaluation of recurrent UTI. In the past 2 months she has gotten 4 UTIs. She was treated with macrobid and then keflex. The UTI returned and she was started on macrobid for 10 days. She denies any dysuria or hematuria. She has urinary frequency and urgency with foul smelling urine when she has a UTI. No hx of nephrolithiasis   PMH: Past Medical History:  Diagnosis Date   Anemia    hx of several yrs ago   Bladder tumor    Breast cancer ([place]) 2012   left   Frequency of urination    Hematuria    History of left breast cancer dx 2012  --- oncologist-  dr Lindi Adie ([facility])-- per lov note, no recurrence   Left UIQ - Invasive ductial carcinoma, Invasive Lobular carcinoma w/ LCIS; Stage IA (T1b, N0, M0)--- s/p breast lumpectomy w/ snl dissection and radiation therapy completed 01-03-2011   History of radiation therapy completed 01-03-2011   total 61Gy  , left breast    History of radiation therapy 2012   Personal history of chemotherapy    Personal history of radiation therapy    Wears glasses     Surgical History: Past Surgical History:  Procedure Laterality Date   APPENDECTOMY  1987   BREAST LUMPECTOMY Left 2012   BREAST LUMPECTOMY WITH AXILLARY LYMPH NODE DISSECTION Left 09-21-2010  dr Margot Chimes  [facility]   CATARACT EXTRACTION W/ INTRAOCULAR LENS IMPLANT Right 06/2016   CYSTOSCOPY N/A 02/05/2020   Procedure: CYSTOSCOPY;  Surgeon: Cleon Gustin, MD;  Location: [facility];  Service: Urology;  Laterality: N/A;   CYSTOSCOPY W/ RETROGRADES Bilateral 01/19/2017   Procedure: CYSTOSCOPY WITH RETROGRADE PYELOGRAM;  Surgeon: Cleon Gustin, MD;  Location: [facility];  Service: Urology;  Laterality: Bilateral;   NASAL SINUS SURGERY  06/2008   TRANSURETHRAL  RESECTION OF BLADDER TUMOR N/A 01/19/2017   Procedure: TRANSURETHRAL RESECTION OF BLADDER TUMOR (TURBT);  Surgeon: Cleon Gustin, MD;  Location: [facility];  Service: Urology;  Laterality: N/A;   TRANSURETHRAL RESECTION OF BLADDER TUMOR N/A 11/29/2017   Procedure: TRANSURETHRAL RESECTION OF BLADDER TUMOR (TURBT);  Surgeon: Cleon Gustin, MD;  [facility];  Service: Urology;  Laterality: N/A;   TRANSURETHRAL RESECTION OF BLADDER TUMOR N/A 02/05/2020   Procedure: TRANSURETHRAL RESECTION OF BLADDER TUMOR (TURBT);  Surgeon: Cleon Gustin, MD;  Location: [facility];  Service: Urology;  Laterality: N/A;    Home Medications:  Allergies as of 01/26/2021       Reactions   Other Rash   Penicillins Rash   Sulfa Antibiotics Rash        Medication List        Accurate as of January 26, 2021  2:35 PM. If you have any questions, ask your nurse or doctor.          CALCIUM 600+D3 PO Take 1 tablet by mouth 2 (two) times daily.   Cranberry 450 MG Caps Take by mouth.   Iron 325 (65 Fe) MG Tabs Take 65 mg by mouth daily.   Magnesium 400 MG Tabs Take 400 mg by mouth at bedtime.   PROBIOTIC-10 PO Take 1 capsule by mouth daily.   Psyllium 48.57 % Powd Take 0.5 Scoops  by mouth daily.   simvastatin 20 MG tablet Commonly known as: ZOCOR SMARTSIG:1 Tablet(s) By Mouth Every Evening   solifenacin 10 MG tablet Commonly known as: VESICARE Take 10 mg by mouth every morning.   valACYclovir 1000 MG tablet Commonly known as: VALTREX Take 1,000 mg by mouth 3 (three) times daily.        Allergies:  Allergies  Allergen Reactions   Other Rash   Penicillins Rash   Sulfa Antibiotics Rash    Family History: Family History  Problem Relation Age of Onset   Cancer Mother        breast   Hypertension Mother    Hyperlipidemia Mother    Diabetes Father    Heart disease Father    Hyperlipidemia Father    Hypertension Father     Diabetes Sister    Hypertension Sister    Hyperlipidemia Sister     Social History:  reports that she quit smoking about 39 years ago. Her smoking use included cigarettes. She has never used smokeless tobacco. She reports current alcohol use of about 1.0 standard drink per week. She reports that she does not use drugs.  ROS: All other review of systems were reviewed and are negative except what is noted above in HPI  Physical Exam: BP 129/79   Pulse 73   Temp (!) 97.3 F (36.3 C)   Wt 159 lb (72.1 kg)   LMP 04/06/2011   BMI 26.06 kg/m   Constitutional:  Alert and oriented, No acute distress. HEENT: [place] AT, moist mucus membranes.  Trachea midline, no masses. Cardiovascular: No clubbing, cyanosis, or edema. Respiratory: Normal respiratory effort, no increased work of breathing. GI: Abdomen is soft, nontender, nondistended, no abdominal masses GU: No CVA tenderness.  Lymph: No cervical or inguinal lymphadenopathy. Skin: No rashes, bruises or suspicious lesions. Neurologic: Grossly intact, no focal deficits, moving all 4 extremities. Psychiatric: Normal mood and affect.  Laboratory Data: Lab Results  Component Value Date   WBC 5.1 02/03/2020   HGB 11.7 (L) 02/03/2020   HCT 35.3 (L) 02/03/2020   MCV 95.9 02/03/2020   PLT 229 02/03/2020    Lab Results  Component Value Date   CREATININE 0.84 02/03/2020    No results found for: PSA  No results found for: TESTOSTERONE  No results found for: HGBA1C  Urinalysis    Component Value Date/Time   APPEARANCEUR Clear 11/10/2020 0947   GLUCOSEU Negative 11/10/2020 0947   BILIRUBINUR Negative 11/10/2020 0947   PROTEINUR Negative 11/10/2020 0947   UROBILINOGEN negative (A) 10/22/2019 0930   NITRITE Negative 11/10/2020 0947   LEUKOCYTESUR Negative 11/10/2020 0947    Lab Results  Component Value Date   LABMICR Comment 11/10/2020   WBCUA 6-10 (A) 03/09/2020   LABEPIT 0-10 03/09/2020   BACTERIA Many (A) 03/09/2020     Pertinent Imaging:  No results found for this or any previous visit.  No results found for this or any previous visit.  No results found for this or any previous visit.  No results found for this or any previous visit.  No results found for this or any previous visit.  No results found for this or any previous visit.  No results found for this or any previous visit.  No results found for this or any previous visit.   Assessment & Plan:    1. Chronic cystitis without hematuria -Urine for culture -BMp and CT hematuria - Urinalysis, Routine w reflex microscopic   No follow-ups on file.  Nicolette Bang, MD  [facility] [place]

## 2021-01-27 LAB — BASIC METABOLIC PANEL
BUN/Creatinine Ratio: 15 (ref 9–23)
BUN: 14 mg/dL (ref 6–24)
CO2: 25 mmol/L (ref 20–29)
Calcium: 9.8 mg/dL (ref 8.7–10.2)
Chloride: 104 mmol/L (ref 96–106)
Creatinine, Ser: 0.93 mg/dL (ref 0.57–1.00)
Glucose: 93 mg/dL (ref 70–99)
Potassium: 4.9 mmol/L (ref 3.5–5.2)
Sodium: 140 mmol/L (ref 134–144)
eGFR: 71 mL/min/{1.73_m2} (ref >59)

## 2021-01-27 LAB — URINALYSIS, ROUTINE W REFLEX MICROSCOPIC
Bilirubin, UA: NEGATIVE
Glucose, UA: NEGATIVE
Ketones, UA: NEGATIVE
Leukocytes,UA: NEGATIVE
Nitrite, UA: NEGATIVE
Protein,UA: NEGATIVE
RBC, UA: NEGATIVE
Specific Gravity, UA: 1.01 (ref 1.005–1.030)
Urobilinogen, Ur: 0.2 mg/dL (ref 0.2–1.0)
pH, UA: 7 (ref 5.0–7.5)

## 2021-01-28 ENCOUNTER — Other Ambulatory Visit: Payer: Self-pay

## 2021-01-28 ENCOUNTER — Ambulatory Visit (HOSPITAL_COMMUNITY)
Admission: RE | Admit: 2021-01-28 | Discharge: 2021-01-28 | Disposition: A | Discharge status: Home / Self Care | Payer: 59 | Source: Ambulatory Visit | Attending: Urology | Admitting: Urology

## 2021-01-28 DIAGNOSIS — N3021 Other chronic cystitis with hematuria: Secondary | ICD-10-CM | POA: Insufficient documentation

## 2021-01-28 MED ORDER — IOHEXOL 350 MG/ML SOLN
100.0000 mL | Freq: Once | INTRAVENOUS | Status: AC | PRN
  Administered 2021-01-28: 100 mL via INTRAVENOUS

## 2021-01-28 MED ORDER — IOHEXOL 300 MG/ML  SOLN: 100.0000 mL | Freq: Once | INTRAMUSCULAR | Status: DC | PRN

## 2021-02-07 ENCOUNTER — Other Ambulatory Visit: Payer: 59

## 2021-02-07 ENCOUNTER — Telehealth: Payer: Self-pay

## 2021-02-07 ENCOUNTER — Other Ambulatory Visit: Payer: Self-pay

## 2021-02-07 DIAGNOSIS — N3021 Other chronic cystitis with hematuria: Secondary | ICD-10-CM

## 2021-02-07 LAB — MICROSCOPIC EXAMINATION
RBC, Urine: NONE SEEN /hpf (ref 0–2)
Renal Epithel, UA: NONE SEEN /hpf
WBC, UA: >30 /hpf — AB (ref 0–5)

## 2021-02-07 LAB — URINALYSIS, ROUTINE W REFLEX MICROSCOPIC
Bilirubin, UA: NEGATIVE
Glucose, UA: NEGATIVE
Ketones, UA: NEGATIVE
Nitrite, UA: POSITIVE — AB
Protein,UA: NEGATIVE
RBC, UA: NEGATIVE
Specific Gravity, UA: 1.01 (ref 1.005–1.030)
Urobilinogen, Ur: 0.2 mg/dL (ref 0.2–1.0)
pH, UA: 7 (ref 5.0–7.5)

## 2021-02-07 MED ORDER — DOXYCYCLINE HYCLATE 100 MG PO CAPS: 100.0000 mg | ORAL_CAPSULE | Freq: Two times a day (BID) | ORAL | 0 refills | Status: DC

## 2021-02-07 NOTE — Telephone Encounter (Signed)
Patient called complaining of UTI symptoms. Patient placed on lab schedule for urine drop off.

## 2021-02-07 NOTE — Telephone Encounter (Signed)
Per Dr. Alyson Ingles patient is to start doxycyline 100mg  po bid x 7 days.  Rx sent.  Patient called and made aware.

## 2021-02-07 NOTE — Addendum Note (Signed)
Addended by: Iris Pert on: 02/07/2021 11:21 AM   Modules accepted: Orders

## 2021-02-10 LAB — URINE CULTURE

## 2021-02-11 ENCOUNTER — Ambulatory Visit: Payer: 59 | Admitting: Urology

## 2021-02-14 ENCOUNTER — Telehealth: Payer: Self-pay

## 2021-02-14 NOTE — Telephone Encounter (Signed)
-----   Message from Cleon Gustin, MD sent at 02/12/2021  6:22 AM EDT ----- Normal CT ----- Message ----- From: Mardelle Matte, CMA Sent: 01/28/2021   1:25 PM EDT To: Cleon Gustin, MD  Please advise

## 2021-02-14 NOTE — Telephone Encounter (Signed)
Called and informed pt of her results, pt has follow up appt on 02/21/2021 to discuss in person.

## 2021-02-21 ENCOUNTER — Ambulatory Visit: Payer: 59 | Admitting: Urology

## 2021-02-21 ENCOUNTER — Encounter: Payer: Self-pay | Admitting: Urology

## 2021-02-21 ENCOUNTER — Other Ambulatory Visit: Payer: Self-pay

## 2021-02-21 VITALS — BP 120/72 | HR 73

## 2021-02-21 DIAGNOSIS — C672 Malignant neoplasm of lateral wall of bladder: Secondary | ICD-10-CM

## 2021-02-21 DIAGNOSIS — N3000 Acute cystitis without hematuria: Secondary | ICD-10-CM | POA: Diagnosis not present

## 2021-02-21 MED ORDER — NITROFURANTOIN MACROCRYSTAL 50 MG PO CAPS: 50.0000 mg | ORAL_CAPSULE | Freq: Four times a day (QID) | ORAL | 11 refills | Status: DC

## 2021-02-21 NOTE — Patient Instructions (Signed)
Urinary Tract Infection, Adult A urinary tract infection (UTI) is an infection of any part of the urinary tract. The urinary tract includes the kidneys, ureters, bladder, and urethra. These organs make, store, and get rid of urine in the body. An upper UTI affects the ureters and kidneys. A lower UTI affects the bladder and urethra. What are the causes? Most urinary tract infections are caused by bacteria in your genital area around your urethra, where urine leaves your body. These bacteria grow and cause inflammation of your urinary tract. What increases the risk? You are more likely to develop this condition if: You have a urinary catheter that stays in place. You are not able to control when you urinate or have a bowel movement (incontinence). You are female and you: Use a spermicide or diaphragm for birth control. Have low estrogen levels. Are pregnant. You have certain genes that increase your risk. You are sexually active. You take antibiotic medicines. You have a condition that causes your flow of urine to slow down, such as: An enlarged prostate, if you are female. Blockage in your urethra. A kidney stone. A nerve condition that affects your bladder control (neurogenic bladder). Not getting enough to drink, or not urinating often. You have certain medical conditions, such as: Diabetes. A weak disease-fighting system (immunesystem). Sickle cell disease. Gout. Spinal cord injury. What are the signs or symptoms? Symptoms of this condition include: Needing to urinate right away (urgency). Frequent urination. This may include small amounts of urine each time you urinate. Pain or burning with urination. Blood in the urine. Urine that smells bad or unusual. Trouble urinating. Cloudy urine. Vaginal discharge, if you are female. Pain in the abdomen or the lower back. You may also have: Vomiting or a decreased appetite. Confusion. Irritability or tiredness. A fever or  chills. Diarrhea. The first symptom in older adults may be confusion. In some cases, they may not have any symptoms until the infection has worsened. How is this diagnosed? This condition is diagnosed based on your medical history and a physical exam. You may also have other tests, including: Urine tests. Blood tests. Tests for STIs (sexually transmitted infections). If you have had more than one UTI, a cystoscopy or imaging studies may be done to determine the cause of the infections. How is this treated? Treatment for this condition includes: Antibiotic medicine. Over-the-counter medicines to treat discomfort. Drinking enough water to stay hydrated. If you have frequent infections or have other conditions such as a kidney stone, you may need to see a health care provider who specializes in the urinary tract (urologist). In rare cases, urinary tract infections can cause sepsis. Sepsis is a life-threatening condition that occurs when the body responds to an infection. Sepsis is treated in the hospital with IV antibiotics, fluids, and other medicines. Follow these instructions at home: Medicines Take over-the-counter and prescription medicines only as told by your health care provider. If you were prescribed an antibiotic medicine, take it as told by your health care provider. Do not stop using the antibiotic even if you start to feel better. General instructions Make sure you: Empty your bladder often and completely. Do not hold urine for long periods of time. Empty your bladder after sex. Wipe from front to back after urinating or having a bowel movement if you are female. Use each tissue only one time when you wipe. Drink enough fluid to keep your urine pale yellow. Keep all follow-up visits. This is important. Contact a health care provider   if: Your symptoms do not get better after 1-2 days. Your symptoms go away and then return. Get help right away if: You have severe pain in your  back or your lower abdomen. You have a fever or chills. You have nausea or vomiting. Summary A urinary tract infection (UTI) is an infection of any part of the urinary tract, which includes the kidneys, ureters, bladder, and urethra. Most urinary tract infections are caused by bacteria in your genital area. Treatment for this condition often includes antibiotic medicines. If you were prescribed an antibiotic medicine, take it as told by your health care provider. Do not stop using the antibiotic even if you start to feel better. Keep all follow-up visits. This is important. This information is not intended to replace advice given to you by your health care provider. Make sure you discuss any questions you have with your health care provider. Document Revised: 11/07/2019 Document Reviewed: 11/07/2019 Elsevier Patient Education  2022 [employer]  

## 2021-02-21 NOTE — Progress Notes (Signed)

## 2021-02-21 NOTE — Progress Notes (Signed)
02/21/2021 9:12 AM   Julie Villa November 30, 1960 [id number]  Referring provider: Orpah Melter, MD [address] [place],  [place] [address]  Followup recurrent UTI   HPI: [name] is a 60yo here for followup for recurrent UTI. No UTis since last visit. She underwent CT hematuria protocol 01/28/2021 which showed no GU abnormalities. She denies any worsening LUTS. No dysuria or hematuria. No other complaints today. She has a hx of breast cancer.    PMH: Past Medical History:  Diagnosis Date   Anemia    hx of several yrs ago   Bladder tumor    Breast cancer ([facility]) 2012   left   Frequency of urination    Hematuria    History of left breast cancer dx 2012  --- oncologist-  dr Lindi Adie ([facility])-- per lov note, no recurrence   Left UIQ - Invasive ductial carcinoma, Invasive Lobular carcinoma w/ LCIS; Stage IA (T1b, N0, M0)--- s/p breast lumpectomy w/ snl dissection and radiation therapy completed 01-03-2011   History of radiation therapy completed 01-03-2011   total 61Gy  , left breast    History of radiation therapy 2012   Personal history of chemotherapy    Personal history of radiation therapy    Wears glasses     Surgical History: Past Surgical History:  Procedure Laterality Date   APPENDECTOMY  1987   BREAST LUMPECTOMY Left 2012   BREAST LUMPECTOMY WITH AXILLARY LYMPH NODE DISSECTION Left 09-21-2010  dr Margot Chimes  [facility]   CATARACT EXTRACTION W/ INTRAOCULAR LENS IMPLANT Right 06/2016   CYSTOSCOPY N/A 02/05/2020   Procedure: CYSTOSCOPY;  Surgeon: Cleon Gustin, MD;  Location: [facility];  Service: Urology;  Laterality: N/A;   CYSTOSCOPY W/ RETROGRADES Bilateral 01/19/2017   Procedure: CYSTOSCOPY WITH RETROGRADE PYELOGRAM;  Surgeon: Cleon Gustin, MD;  Location: [facility];  Service: Urology;  Laterality: Bilateral;   NASAL SINUS SURGERY  06/2008   TRANSURETHRAL RESECTION OF BLADDER TUMOR N/A 01/19/2017   Procedure: TRANSURETHRAL  RESECTION OF BLADDER TUMOR (TURBT);  Surgeon: Cleon Gustin, MD;  Location: [facility];  Service: Urology;  Laterality: N/A;   TRANSURETHRAL RESECTION OF BLADDER TUMOR N/A 11/29/2017   Procedure: TRANSURETHRAL RESECTION OF BLADDER TUMOR (TURBT);  Surgeon: Cleon Gustin, MD;  Location: [facility];  Service: Urology;  Laterality: N/A;   TRANSURETHRAL RESECTION OF BLADDER TUMOR N/A 02/05/2020   Procedure: TRANSURETHRAL RESECTION OF BLADDER TUMOR (TURBT);  Surgeon: Cleon Gustin, MD;  Location: [facility];  Service: Urology;  Laterality: N/A;    Home Medications:  Allergies as of 02/21/2021       Reactions   Other Rash   Penicillins Rash   Sulfa Antibiotics Rash        Medication List        Accurate as of February 21, 2021  9:12 AM. If you have any questions, ask your nurse or doctor.          CALCIUM 600+D3 PO Take 1 tablet by mouth 2 (two) times daily.   Cranberry 450 MG Caps Take by mouth.   doxycycline 100 MG capsule Commonly known as: VIBRAMYCIN Take 1 capsule (100 mg total) by mouth 2 (two) times daily.   Iron 325 (65 Fe) MG Tabs Take 65 mg by mouth daily.   Magnesium 400 MG Tabs Take 400 mg by mouth at bedtime.   PROBIOTIC-10 PO Take 1 capsule by mouth daily.   Psyllium 48.57 %  Powd Take 0.5 Scoops by mouth daily.   simvastatin 20 MG tablet Commonly known as: ZOCOR SMARTSIG:1 Tablet(s) By Mouth Every Evening   solifenacin 10 MG tablet Commonly known as: VESICARE Take 10 mg by mouth every morning.   valACYclovir 1000 MG tablet Commonly known as: VALTREX Take 1,000 mg by mouth 3 (three) times daily.        Allergies:  Allergies  Allergen Reactions   Other Rash   Penicillins Rash   Sulfa Antibiotics Rash    Family History: Family History  Problem Relation Age of Onset   Cancer Mother        breast   Hypertension Mother    Hyperlipidemia Mother    Diabetes Father    Heart disease Father     Hyperlipidemia Father    Hypertension Father    Diabetes Sister    Hypertension Sister    Hyperlipidemia Sister     Social History:  reports that she quit smoking about 39 years ago. Her smoking use included cigarettes. She has never used smokeless tobacco. She reports current alcohol use of about 1.0 standard drink per week. She reports that she does not use drugs.  ROS: All other review of systems were reviewed and are negative except what is noted above in HPI  Physical Exam: BP 120/72   Pulse 73   LMP 04/06/2011   Constitutional:  Alert and oriented, No acute distress. HEENT: [place] AT, moist mucus membranes.  Trachea midline, no masses. Cardiovascular: No clubbing, cyanosis, or edema. Respiratory: Normal respiratory effort, no increased work of breathing. GI: Abdomen is soft, nontender, nondistended, no abdominal masses GU: No CVA tenderness.  Lymph: No cervical or inguinal lymphadenopathy. Skin: No rashes, bruises or suspicious lesions. Neurologic: Grossly intact, no focal deficits, moving all 4 extremities. Psychiatric: Normal mood and affect.  Laboratory Data: Lab Results  Component Value Date   WBC 5.1 02/03/2020   HGB 11.7 (L) 02/03/2020   HCT 35.3 (L) 02/03/2020   MCV 95.9 02/03/2020   PLT 229 02/03/2020    Lab Results  Component Value Date   CREATININE 0.93 01/26/2021    No results found for: PSA  No results found for: TESTOSTERONE  No results found for: HGBA1C  Urinalysis    Component Value Date/Time   APPEARANCEUR Cloudy (A) 02/07/2021 1339   GLUCOSEU Negative 02/07/2021 1339   BILIRUBINUR Negative 02/07/2021 1339   PROTEINUR Negative 02/07/2021 1339   UROBILINOGEN negative (A) 10/22/2019 0930   NITRITE Positive (A) 02/07/2021 1339   LEUKOCYTESUR 3+ (A) 02/07/2021 1339    Lab Results  Component Value Date   LABMICR See below: 02/07/2021   WBCUA >30 (A) 02/07/2021   LABEPIT 0-10 02/07/2021   BACTERIA Many (A) 02/07/2021    Pertinent  Imaging: CT hematuria 01/28/2021: Images reviewed and discussed with the patient No results found for this or any previous visit.  No results found for this or any previous visit.  No results found for this or any previous visit.  No results found for this or any previous visit.  No results found for this or any previous visit.  No results found for this or any previous visit.  Results for orders placed during the hospital encounter of 01/28/21  CT HEMATURIA WORKUP  Narrative CLINICAL DATA:  Hematuria  EXAM: CT ABDOMEN AND PELVIS WITHOUT AND WITH CONTRAST  TECHNIQUE: Multidetector CT imaging of the abdomen and pelvis was performed following the standard protocol before and following the bolus administration of intravenous contrast.  CONTRAST:  143mL OMNIPAQUE IOHEXOL 350 MG/ML SOLN  COMPARISON:  None.  FINDINGS: Lower chest: No acute abnormality.  Hepatobiliary: No suspicious liver lesions. No gallstones, gallbladder wall thickening, or biliary dilatation.  Pancreas: Unremarkable. No pancreatic ductal dilatation or surrounding inflammatory changes.  Spleen: Normal in size without focal abnormality.  Adrenals/Urinary Tract: Bilateral adrenal glands are unremarkable. No hydronephrosis. No nephrolithiasis. Delayed imaging demonstrates no suspicious filling defects of the renal collecting systems, although the distal right ureter is incompletely opacified. Bladder is unremarkable.  Stomach/Bowel: Stomach is within normal limits. Appendix is surgically absent. No evidence of bowel wall thickening, distention, or inflammatory changes.  Vascular/Lymphatic: Aortic atherosclerosis. No enlarged abdominal or pelvic lymph nodes.  Reproductive: Uterus and bilateral adnexa are unremarkable.  Other: No abdominal wall hernia or abnormality. No abdominopelvic ascites.  Musculoskeletal: No acute or significant osseous findings.  IMPRESSION: No nephroureterolithiasis or  suspicious filling defects of the opacified renal collecting systems.  Aortic Atherosclerosis (ICD10-I70.0).   Electronically Signed By: Yetta Glassman M.D. On: 01/28/2021 12:39  No results found for this or any previous visit.   Assessment & Plan:    1. Acute cystitis without hematuria -We discussed the natural hx of recurrent UTIs and the various causes. We discussed the treatment options including post coital prophylaxis, daily prophylaxis, topical estrogen therapy. Since she has a hx of breast cancer she cannot take topical estrogen cream. We will trial macrobid 50mg  qhs. RTC 3 months - Urinalysis, Routine w reflex microscopic   Return in about 3 months (around 05/24/2021) for cystoscopy.  Nicolette Bang, MD  [facility]

## 2021-02-28 ENCOUNTER — Other Ambulatory Visit: Payer: Self-pay

## 2021-02-28 ENCOUNTER — Inpatient Hospital Stay: Payer: 59 | Attending: Adult Health | Admitting: Adult Health

## 2021-02-28 ENCOUNTER — Encounter: Payer: Self-pay | Admitting: Adult Health

## 2021-02-28 VITALS — BP 126/69 | HR 60 | Temp 97.8°F | Resp 18 | Ht 65.5 in | Wt 160.8 lb

## 2021-02-28 DIAGNOSIS — Z8249 Family history of ischemic heart disease and other diseases of the circulatory system: Secondary | ICD-10-CM | POA: Diagnosis not present

## 2021-02-28 DIAGNOSIS — Z87891 Personal history of nicotine dependence: Secondary | ICD-10-CM | POA: Insufficient documentation

## 2021-02-28 DIAGNOSIS — Z1231 Encounter for screening mammogram for malignant neoplasm of breast: Secondary | ICD-10-CM | POA: Insufficient documentation

## 2021-02-28 DIAGNOSIS — Z79811 Long term (current) use of aromatase inhibitors: Secondary | ICD-10-CM | POA: Insufficient documentation

## 2021-02-28 DIAGNOSIS — Z13820 Encounter for screening for osteoporosis: Secondary | ICD-10-CM | POA: Insufficient documentation

## 2021-02-28 DIAGNOSIS — Z882 Allergy status to sulfonamides status: Secondary | ICD-10-CM | POA: Diagnosis not present

## 2021-02-28 DIAGNOSIS — Z79899 Other long term (current) drug therapy: Secondary | ICD-10-CM | POA: Insufficient documentation

## 2021-02-28 DIAGNOSIS — Z9221 Personal history of antineoplastic chemotherapy: Secondary | ICD-10-CM | POA: Insufficient documentation

## 2021-02-28 DIAGNOSIS — Z8551 Personal history of malignant neoplasm of bladder: Secondary | ICD-10-CM | POA: Diagnosis not present

## 2021-02-28 DIAGNOSIS — Z880 Allergy status to penicillin: Secondary | ICD-10-CM | POA: Diagnosis not present

## 2021-02-28 DIAGNOSIS — Z923 Personal history of irradiation: Secondary | ICD-10-CM | POA: Diagnosis not present

## 2021-02-28 DIAGNOSIS — Z87440 Personal history of urinary (tract) infections: Secondary | ICD-10-CM | POA: Diagnosis not present

## 2021-02-28 DIAGNOSIS — Z853 Personal history of malignant neoplasm of breast: Secondary | ICD-10-CM | POA: Diagnosis not present

## 2021-02-28 DIAGNOSIS — N952 Postmenopausal atrophic vaginitis: Secondary | ICD-10-CM | POA: Insufficient documentation

## 2021-02-28 DIAGNOSIS — Z8349 Family history of other endocrine, nutritional and metabolic diseases: Secondary | ICD-10-CM | POA: Insufficient documentation

## 2021-02-28 DIAGNOSIS — Z9049 Acquired absence of other specified parts of digestive tract: Secondary | ICD-10-CM | POA: Diagnosis not present

## 2021-02-28 DIAGNOSIS — Z803 Family history of malignant neoplasm of breast: Secondary | ICD-10-CM | POA: Diagnosis not present

## 2021-02-28 DIAGNOSIS — D509 Iron deficiency anemia, unspecified: Secondary | ICD-10-CM | POA: Insufficient documentation

## 2021-02-28 DIAGNOSIS — Z833 Family history of diabetes mellitus: Secondary | ICD-10-CM | POA: Diagnosis not present

## 2021-02-28 NOTE — Progress Notes (Signed)
CLINIC:  Survivorship   REASON FOR VISIT:  Routine follow-up for history of breast cancer.   BRIEF ONCOLOGIC HISTORY:  Oncology History  Carcinoma of upper-inner quadrant of left female breast ([place]) (Resolved)  09/21/2010 Surgery   Left breast lumpectomy invasive low grade ductal carcinoma 0.6 cm with low-grade DCIS invasive lobular cancer spent 0.3 cm grade 1; 2 sentinel nodes negative   11/09/2010 - 01/08/2011 Radiation Therapy   Adjuvant radiation (dates were estimated by the patient)   02/01/2011 - 01/2016 Anti-estrogen oral therapy   Tamoxifen 20 mg by mouth daily switched to anastrozole 1 mg daily February 2016 (after she became postmenopausal)    Miscellaneous   BCI testing sent and showed low likelihood of benefit of extending anti-estrogen therapy past 5 years      INTERVAL HISTORY:  Julie Villa presents to the [facility] today for routine follow-up for her history of breast cancer.  Overall, she reports feeling quite well.  Her most recent mammogram was completed on July 07, 2020.  It showed no evidence of malignancy, and breast density category B.  She is exercising with walking regularly.  She also recently started dance classes at the [employer] with her husband.  Martin continues to see Dr. Alyson Ingles in urology for her history of bladder cancer.  She has had recurrent urinary tract infections which have been somewhat problematic.  She is working on a new approach with him and is taking 50 mg of Macrodantin 4 times a day.  She sees him again in 6 months.  [name] has had no other health changes in the past year.  She denies any new issues with her breast or any questions today.   REVIEW OF SYSTEMS:  Review of Systems  Constitutional:  Negative for appetite change, chills, fatigue, fever and unexpected weight change.  HENT:   Negative for hearing loss, lump/mass, mouth sores, sore throat and trouble swallowing.   Eyes:  Negative for eye problems and  icterus.  Respiratory:  Negative for chest tightness, cough and shortness of breath.   Cardiovascular:  Negative for chest pain, leg swelling and palpitations.  Gastrointestinal:  Negative for abdominal distention, abdominal pain, constipation, diarrhea, nausea and vomiting.  Endocrine: Negative for hot flashes.  Skin:  Negative for itching and rash.  Neurological:  Negative for dizziness, extremity weakness, headaches and numbness.  Hematological:  Negative for adenopathy. Does not bruise/bleed easily.  Psychiatric/Behavioral:  Negative for depression. The patient is not nervous/anxious.  Breast: Denies any new nodularity, masses, tenderness, nipple changes, or nipple discharge.       PAST MEDICAL/SURGICAL HISTORY:  Past Medical History:  Diagnosis Date   Anemia    hx of several yrs ago   Bladder tumor    Breast cancer ([place]) 2012   left   Frequency of urination    Hematuria    History of left breast cancer dx 2012  --- oncologist-  dr Lindi Adie ([facility])-- per lov note, no recurrence   Left UIQ - Invasive ductial carcinoma, Invasive Lobular carcinoma w/ LCIS; Stage IA (T1b, N0, M0)--- s/p breast lumpectomy w/ snl dissection and radiation therapy completed 01-03-2011   History of radiation therapy completed 01-03-2011   total 61Gy  , left breast    History of radiation therapy 2012   Personal history of chemotherapy    Personal history of radiation therapy    Wears glasses    Past Surgical History:  Procedure Laterality Date   APPENDECTOMY  1987   BREAST LUMPECTOMY Left 2012   BREAST LUMPECTOMY WITH AXILLARY LYMPH NODE DISSECTION Left 09-21-2010  dr Margot Chimes  [facility]   CATARACT EXTRACTION W/ INTRAOCULAR LENS IMPLANT Right 06/2016   CYSTOSCOPY N/A 02/05/2020   Procedure: CYSTOSCOPY;  Surgeon: Cleon Gustin, MD;  Location: [facility];  Service: Urology;  Laterality: N/A;   CYSTOSCOPY W/ RETROGRADES Bilateral 01/19/2017   Procedure: CYSTOSCOPY WITH RETROGRADE PYELOGRAM;   Surgeon: Cleon Gustin, MD;  Location: [facility];  Service: Urology;  Laterality: Bilateral;   NASAL SINUS SURGERY  06/2008   TRANSURETHRAL RESECTION OF BLADDER TUMOR N/A 01/19/2017   Procedure: TRANSURETHRAL RESECTION OF BLADDER TUMOR (TURBT);  Surgeon: Cleon Gustin, MD;  Location: [facility];  Service: Urology;  Laterality: N/A;   TRANSURETHRAL RESECTION OF BLADDER TUMOR N/A 11/29/2017   Procedure: TRANSURETHRAL RESECTION OF BLADDER TUMOR (TURBT);  Surgeon: Cleon Gustin, MD;  Location: [facility];  Service: Urology;  Laterality: N/A;   TRANSURETHRAL RESECTION OF BLADDER TUMOR N/A 02/05/2020   Procedure: TRANSURETHRAL RESECTION OF BLADDER TUMOR (TURBT);  Surgeon: Cleon Gustin, MD;  Location: [facility];  Service: Urology;  Laterality: N/A;     ALLERGIES:  Allergies  Allergen Reactions   Other Rash   Penicillins Rash   Sulfa Antibiotics Rash     CURRENT MEDICATIONS:  Outpatient Encounter Medications as of 02/28/2021  Medication Sig   Calcium Carbonate-Vitamin D (CALCIUM 600+D3 PO) Take 1 tablet by mouth 2 (two) times daily.   Cranberry 450 MG CAPS Take by mouth.   Ferrous Sulfate (IRON) 325 (65 Fe) MG TABS Take 65 mg by mouth daily.    Magnesium 400 MG TABS Take 400 mg by mouth at bedtime.    nitrofurantoin (MACRODANTIN) 50 MG capsule Take 1 capsule (50 mg total) by mouth 4 (four) times daily.   Probiotic Product (PROBIOTIC-10 PO) Take 1 capsule by mouth daily.    Psyllium 48.57 % POWD Take 0.5 Scoops by mouth daily.    simvastatin (ZOCOR) 20 MG tablet SMARTSIG:1 Tablet(s) By Mouth Every Evening   solifenacin (VESICARE) 10 MG tablet Take 10 mg by mouth every morning.   [DISCONTINUED] doxycycline (VIBRAMYCIN) 100 MG capsule Take 1 capsule (100 mg total) by mouth 2 (two) times daily. (Patient not taking: Reported on 02/21/2021)   [DISCONTINUED] valACYclovir (VALTREX) 1000 MG tablet Take 1,000 mg by mouth 3 (three)  times daily.   No facility-administered encounter medications on file as of 02/28/2021.     ONCOLOGIC FAMILY HISTORY:  Family History  Problem Relation Age of Onset   Cancer Mother        breast   Hypertension Mother    Hyperlipidemia Mother    Diabetes Father    Heart disease Father    Hyperlipidemia Father    Hypertension Father    Diabetes Sister    Hypertension Sister    Hyperlipidemia Sister       SOCIAL HISTORY:  Social History   Socioeconomic History   Marital status: Married    Spouse name: Not on file   Number of children: Not on file   Years of education: Not on file   Highest education level: Not on file  Occupational History   Not on file  Tobacco Use   Smoking status: Former    Years: 3.00    Types: Cigarettes    Quit date: 01/17/1982    Years since quitting: 39.1   Smokeless tobacco: Never  Vaping Use  Vaping Use: Never used  Substance and Sexual Activity   Alcohol use: Yes    Alcohol/week: 1.0 standard drink    Types: 1 Glasses of wine per week    Comment: glass wine couple times a month   Drug use: No   Sexual activity: Yes  Other Topics Concern   Not on file  Social History Narrative   Not on file   Social Determinants of Health   Financial Resource Strain: Not on file  Food Insecurity: Not on file  Transportation Needs: Not on file  Physical Activity: Not on file  Stress: Not on file  Social Connections: Not on file  Intimate Partner Violence: Not on file        PHYSICAL EXAMINATION:  Vital Signs: Vitals:   02/28/21 0911  BP: 126/69  Pulse: 60  Resp: 18  Temp: 97.8 F (36.6 C)  SpO2: 100%   Filed Weights   02/28/21 0911  Weight: 160 lb 12.8 oz (72.9 kg)   General: Well-nourished, well-appearing female in no acute distress.  Unaccompanied today.   HEENT: Head is normocephalic.  Pupils equal and reactive to light. Conjunctivae clear without exudate.  Sclerae anicteric. Oral mucosa is pink, moist.  Oropharynx is pink  without lesions or erythema.  Lymph: No cervical, supraclavicular, or infraclavicular lymphadenopathy noted on palpation.  Cardiovascular: Regular rate and rhythm.Marland Kitchen Respiratory: Clear to auscultation bilaterally. Chest expansion symmetric; breathing non-labored.  Breast Exam:  -Left breast: No appreciable masses on palpation. No skin redness, thickening, or peau d'orange appearance; no nipple retraction or nipple discharge; mild distortion in symmetry at previous lumpectomy site well healed scar without erythema or nodularity.  -Right breast: No appreciable masses on palpation. No skin redness, thickening, or peau d'orange appearance; no nipple retraction or nipple discharge -Axilla: No axillary adenopathy bilaterally.  GI: Abdomen soft and round; non-tender, non-distended. Bowel sounds normoactive. No hepatosplenomegaly.   GU: Deferred.  Neuro: No focal deficits. Steady gait.  Psych: Mood and affect normal and appropriate for situation.  MSK: No focal spinal tenderness to palpation, full range of motion in bilateral upper extremities Extremities: No edema. Skin: Warm and dry.  LABORATORY DATA:  None for this visit   DIAGNOSTIC IMAGING:  Most recent mammogram:   CLINICAL DATA:  Screening.   EXAM: DIGITAL SCREENING BILATERAL MAMMOGRAM WITH TOMOSYNTHESIS AND CAD   TECHNIQUE: Bilateral screening digital craniocaudal and mediolateral oblique mammograms were obtained. Bilateral screening digital breast tomosynthesis was performed. The images were evaluated with computer-aided detection.   COMPARISON:  Previous exam(s).   ACR Breast Density Category b: There are scattered areas of fibroglandular density.   FINDINGS: There are no findings suspicious for malignancy. The images were evaluated with computer-aided detection.   IMPRESSION: No mammographic evidence of malignancy. A result letter of this screening mammogram will be mailed directly to the patient.    RECOMMENDATION: Screening mammogram in one year. (Code:SM-B-01Y)   BI-RADS CATEGORY  1: Negative.     Electronically Signed   By: Everlean Alstrom M.D.   On: 07/07/2020 12:49   ASSESSMENT AND PLAN:  Ms.. Peragine is a pleasant 60 y.o. female with history of Stage IA left breast invasive ductal carcinoma, ER+/PR+/HER2-, diagnosed in 09/2010, treated with lumpectomy, adjuvant radiation therapy, and anti-estrogen therapy with Tamoxifen followed by Anastrozole completed in 01/2016.  [facility] for surveillance and routine follow-up.   1. History of breast cancer:  Ms. Butterbaugh is currently clinically and radiographically without evidence of disease or recurrence  of breast cancer.  She is due for repeat mammogram in March 2023.  We reviewed her breast density which is category B which means that mammograms are good imaging test for her to detect cancer and she does not need supplemental MRI.  She will return in one year for LTS follow up.   I encouraged her to call me with any questions or concerns before her next visit at the cancer center, and I would be happy to see her sooner, if needed.    2. Bone health: She has completed her anastrozole.  She has very mild osteopenia from previous bone density testing.  She will continue to follow with her primary care for her bone density testing and management.  3. Cancer screening:  Due to Ms. Shillingford's history and her age, she should receive screening for skin cancers, colon cancer, and gynecologic cancers.  She is up to date with these. She was encouraged to follow-up with her PCP for appropriate cancer screenings.   4. Health maintenance and wellness promotion: Ms. Strothman was encouraged to consume 5-7 servings of fruits and vegetables per day. She was also encouraged to engage in moderate to vigorous exercise for 30 minutes per day most days of the week. She was instructed to limit her alcohol consumption and continue to abstain from  tobacco use.  Yatziry is doing all of these things quite well.  She will continue to move forward with these.    Dispo:  -Return to cancer center in one year for LTS follow up -Mammogram in March 2023   Total encounter time: 20 minutes in face-to-face visit time, chart review, lab review, care coordination, and documentation of the encounter.   Gardenia Phlegm, [place] [phone]   Note: PRIMARY CARE PROVIDER Orpah Melter, [place] [phone]

## 2021-03-17 ENCOUNTER — Telehealth: Payer: Self-pay

## 2021-03-17 ENCOUNTER — Other Ambulatory Visit: Payer: Self-pay

## 2021-03-17 ENCOUNTER — Other Ambulatory Visit: Payer: 59

## 2021-03-17 ENCOUNTER — Other Ambulatory Visit: Payer: Self-pay | Admitting: Physician Assistant

## 2021-03-17 DIAGNOSIS — N3021 Other chronic cystitis with hematuria: Secondary | ICD-10-CM

## 2021-03-17 DIAGNOSIS — N3000 Acute cystitis without hematuria: Secondary | ICD-10-CM

## 2021-03-17 LAB — URINALYSIS, ROUTINE W REFLEX MICROSCOPIC
Bilirubin, UA: NEGATIVE
Glucose, UA: NEGATIVE
Ketones, UA: NEGATIVE
Nitrite, UA: POSITIVE — AB
Protein,UA: NEGATIVE
Specific Gravity, UA: 1.01 (ref 1.005–1.030)
Urobilinogen, Ur: 0.2 mg/dL (ref 0.2–1.0)
pH, UA: 6 (ref 5.0–7.5)

## 2021-03-17 LAB — MICROSCOPIC EXAMINATION
RBC, Urine: >30 /hpf — AB (ref 0–2)
Renal Epithel, UA: NONE SEEN /hpf
WBC, UA: >30 /hpf — AB (ref 0–5)

## 2021-03-17 MED ORDER — NITROFURANTOIN MACROCRYSTAL 50 MG PO CAPS: 50.0000 mg | ORAL_CAPSULE | Freq: Every day | ORAL | 11 refills | Status: DC

## 2021-03-17 MED ORDER — DOXYCYCLINE HYCLATE 100 MG PO CAPS: 100.0000 mg | ORAL_CAPSULE | Freq: Two times a day (BID) | ORAL | 0 refills | Status: DC

## 2021-03-17 NOTE — Telephone Encounter (Signed)
Patient called with UTI symptoms. Placed on lab schedule for urine drop off. Will forward results to MD.

## 2021-03-17 NOTE — Progress Notes (Signed)
I called and spoke with patient about recent ua drop off. Reviewed medication with Dr. Alyson Ingles as well.   Patient will begin macrodantin 50mg  po nightly #30 with 11refills for UTI suppression. Reviewed instructions with patient and voiced understanding. New updated rx sent to pharmacy to reflect medication frequency change.  Current UA being reviewed by Sharee Pimple, [place] for acute treatment.

## 2021-03-17 NOTE — Telephone Encounter (Signed)
-----   Message from Reynaldo Minium, [place] sent at 03/17/2021  2:34 PM EST ----- Cultures and allergies reviewed. Rx for Doxy 100 BID for 10 days sent to pt pharmacy. ----- Message ----- From: Dorisann Frames, RN Sent: 03/17/2021   1:17 PM EST To: Berneice Heinrich [place], PA-C  Dr. Alyson Ingles patient- please review.

## 2021-03-17 NOTE — Progress Notes (Signed)
Patient presented for urine drop-off symptomatic for UTI.  She has been on Macrodantin 50 mg 4 times daily prior to onset of symptoms.  Urinalysis indicated need for treatment of acute infection.  Culture is pending.  Based on previous cultures and patient allergies, doxycycline 100 mg 1 p.o. twice daily for 10 days prescribed.

## 2021-03-17 NOTE — Telephone Encounter (Signed)
Called patient. No answer. Left message of medication sent to pharmacy.

## 2021-03-20 LAB — URINE CULTURE

## 2021-03-21 ENCOUNTER — Other Ambulatory Visit: Payer: Self-pay | Admitting: Physician Assistant

## 2021-05-09 ENCOUNTER — Telehealth: Payer: Self-pay

## 2021-05-09 NOTE — Telephone Encounter (Signed)
Patient left a voicemail:  Returning a missed call.  Please advise.  Thanks, Helene Kelp

## 2021-05-10 NOTE — Telephone Encounter (Signed)
Returned call. Left message to return

## 2021-05-10 NOTE — Telephone Encounter (Signed)
Patient returned call.  Appt information clariified

## 2021-05-11 ENCOUNTER — Other Ambulatory Visit: Payer: 59 | Admitting: Urology

## 2021-05-16 ENCOUNTER — Other Ambulatory Visit: Payer: 59 | Admitting: Urology

## 2021-06-22 ENCOUNTER — Other Ambulatory Visit: Payer: Self-pay | Admitting: Family Medicine

## 2021-06-22 DIAGNOSIS — Z1231 Encounter for screening mammogram for malignant neoplasm of breast: Secondary | ICD-10-CM

## 2021-07-07 ENCOUNTER — Ambulatory Visit
Admission: RE | Admit: 2021-07-07 | Discharge: 2021-07-07 | Disposition: A | Discharge status: Home / Self Care | Payer: 59 | Source: Ambulatory Visit | Attending: Family Medicine | Admitting: Family Medicine

## 2021-07-07 DIAGNOSIS — Z1231 Encounter for screening mammogram for malignant neoplasm of breast: Secondary | ICD-10-CM

## 2021-07-08 ENCOUNTER — Ambulatory Visit: Payer: 59 | Admitting: Urology

## 2021-07-08 VITALS — BP 139/84 | HR 55

## 2021-07-08 DIAGNOSIS — C672 Malignant neoplasm of lateral wall of bladder: Secondary | ICD-10-CM

## 2021-07-08 LAB — URINALYSIS, ROUTINE W REFLEX MICROSCOPIC
Bilirubin, UA: NEGATIVE
Glucose, UA: NEGATIVE
Ketones, UA: NEGATIVE
Leukocytes,UA: NEGATIVE
Nitrite, UA: NEGATIVE
Protein,UA: NEGATIVE
RBC, UA: NEGATIVE
Specific Gravity, UA: 1.015 (ref 1.005–1.030)
Urobilinogen, Ur: 0.2 mg/dL (ref 0.2–1.0)
pH, UA: 7 (ref 5.0–7.5)

## 2021-07-08 MED ORDER — CIPROFLOXACIN HCL 500 MG PO TABS
500.0000 mg | ORAL_TABLET | Freq: Once | ORAL | Status: AC
  Administered 2021-07-08: 500 mg via ORAL

## 2021-07-08 NOTE — Progress Notes (Signed)
? ?  07/08/21 ? ?CC: followup bladder cancer ? ? ?HPI: ?Julie Villa is a 61yo here for followup for low grade bladder cancer ?Blood pressure 139/84, pulse (!) 55, last menstrual period 04/06/2011. ?NED. A&Ox3.   ?No respiratory distress   ?Abd soft, NT, ND ?Normal external genitalia with patent urethral meatus ? ?Cystoscopy Procedure Note ? ?Patient identification was confirmed, informed consent was obtained, and patient was prepped using Betadine solution.  Lidocaine jelly was administered per urethral meatus.   ? ?Procedure: ?- Flexible cystoscope introduced, without any difficulty.   ?- Thorough search of the bladder revealed: ?   normal urethral meatus ?   normal urothelium ?   no stones ?   no ulcers  ?   2cm right lateral wall papillary tumor ?   no urethral polyps ?   no trabeculation ? ?- Ureteral orifices were normal in position and appearance. ? ?Post-Procedure: ?- Patient tolerated the procedure well ? ?Assessment/ Plan: ?[place] for bladder tumor resection. Risks/benefits/alternatives to cystoscopy with bladder resection was explained to the patient and she understands and wishes to proceed with surgery ? ? No follow-ups on file. ? ?Nicolette Bang, MD  ?

## 2021-07-08 NOTE — Patient Instructions (Signed)
Dear Ms. Tarte, ?  ?Thank you for choosing [facility] to assist in your urologic care for your upcoming surgery. The following information below includes specific dates and details related to surgery: ?  ?The Surgical Procedure you are scheduled to have performed is cystoscopy with transurethral resection bladder tumor, bilateral pyelogram retrograde ?  ?Surgery Date: 08/08/2021 ?  ?Physician performing the surgery: Dr. Nicolette Bang ?  ?Do not eat or drink after midnight the day before your surgery.  ?  ?You will need a driver the day of surgery and will not be able to operate heavy machinery for 24 hours after.  ?  ?Your surgery will be performed at  ?[address] ?[place], [place] [address] ?  ?Enter at the [employer] and check in at the [employer] desk.   ?  ?Pre-Admit Testing Info ?  ?Pre- Admit appointments are interview with an anesthesiologist or a pre-operative anesthesia nurse. These appointments are typically completed as an in person visit but can take place over the telephone.  You will be contacted to confirm the date and time window.  ?  ?If you have any questions or concerns, please don't hesitate to call the office at [phone] ?  ?Thank you,  ?  ?Gibson Ramp, RN ?Clinical Surgery Coordinator ?[facility]  ?

## 2021-07-08 NOTE — Progress Notes (Signed)
I spoke with Ms Garman. We have discussed possible surgery dates and 08/08/2021 was agreed upon by all parties. Patient given information about surgery date, what to expect pre-operatively and post operatively.  ?  ?We discussed that a pre-op nurse will be calling to set up the pre-op visit that will take place prior to surgery. Informed patient that our office will communicate any additional care to be provided after surgery.  ?  ?Patients questions or concerns were discussed during our call. Advised to call our office should there be any additional information, questions or concerns that arise. Patient verbalized understanding.   ?

## 2021-07-12 ENCOUNTER — Encounter: Payer: Self-pay | Admitting: Urology

## 2021-08-03 NOTE — Patient Instructions (Signed)
? ? ? ? ? ? ? ? Julie Villa ? 08/03/2021  ?  ? '@PREFPERIOPPHARMACY'$ @ ? ? Your procedure is scheduled on  08/08/2021. ? ? Report to [name] at  0800  A.M. ? ? Call this number if you have problems the morning of surgery: ? [phone] ? ? Remember: ? Do not eat or drink after midnight. ?  ?  ? Take these medicines the morning of surgery with A SIP OF WATER  ? ?                                       entocort. ?  ? ? Do not wear jewelry, make-up or nail polish. ? Do not wear lotions, powders, or perfumes, or deodorant. ? Do not shave 48 hours prior to surgery.  Men may shave face and neck. ? Do not bring valuables to the hospital. ? [facility] is not responsible for any belongings or valuables. ? ?Contacts, dentures or bridgework may not be worn into surgery.  Leave your suitcase in the car.  After surgery it may be brought to your room. ? ?For patients admitted to the hospital, discharge time will be determined by your treatment team. ? ?Patients discharged the day of surgery will not be allowed to drive home and must have someone with them for 24 hours.  ? ? ?Special instructions:   DO NOT smoke tobacco or vape for 24 hours before your procedure. ? ?Please read over the following fact sheets that you were given. ?Coughing and Deep Breathing, Surgical Site Infection Prevention, Anesthesia Post-op Instructions, and Care and Recovery After Surgery ?  ? ? ? Transurethral Resection of Bladder Tumor, Care After ?The following information offers guidance on how to care for yourself after your procedure. Your health care provider may also give you more specific instructions. If you have problems or questions, contact your health care provider. ?What can I expect after the procedure? ?After the procedure, it is common to have: ?A small amount of blood or small blood clots in your urine for up to 2 weeks. ?Soreness or mild pain from your catheter. After your catheter is removed, you may have mild soreness,  especially when urinating. ?A need to urinate often. ?Pain in your lower abdomen. ?Follow these instructions at home: ?Medicines ? ?Take over-the-counter and prescription medicines only as told by your health care provider. ?If you were prescribed an antibiotic medicine, take it as told by your health care provider. Do not stop taking the antibiotic even if you start to feel better. ?Ask your health care provider if the medicine prescribed to you: ?Requires you to avoid driving or using machinery. ?Can cause constipation. You may need to take these actions to prevent or treat constipation: ?Drink enough fluid to keep your urine pale yellow. ?Take over-the-counter or prescription medicines. ?Eat foods that are high in fiber, such as beans, whole grains, and fresh fruits and vegetables. ?Limit foods that are high in fat and processed sugars, such as fried or sweet foods. ?Activity ? ?If you were given a sedative during the procedure, it can affect you for several hours. Do not drive or operate machinery until your health care provider says that it is safe. ?Rest as told by your health care provider. ?Avoid sitting for a long time without moving. Get up to take short walks every 1-2 hours. This is important to improve  blood flow and breathing. Ask for help if you feel weak or unsteady. ?Do not lift anything that is heavier than 10 lb (4.5 kg), or the limit that you are told, until your health care provider says that it is safe. ?Avoid intense physical activity for as long as told by your health care provider. ?Do not have sex until your health care provider approves. ?Return to your normal activities as told by your health care provider. Ask your health care provider what activities are safe for you. ?General instructions ?If you have a catheter, follow instructions from your health care provider about caring for your catheter and your drainage bag. ?Do not drink alcohol for as long as told by your health care  provider. This is especially important if you are taking prescription pain medicines. ?Do not use any products that contain nicotine or tobacco. These products include cigarettes, chewing tobacco, and vaping devices, such as e-cigarettes. If you need help quitting, ask your health care provider. ?Wear compression stockings as told by your health care provider. These stockings help to prevent blood clots and reduce swelling in your legs. ?Keep all follow-up visits. This is important. ?You will need to be followed closely with regular checks of your bladder and urethra (cystoscopies) to make sure that the cancer does not come back. ?Contact a health care provider if: ?You have blood in your urine for more than 2 weeks. ?You become constipated. Signs of constipation may include: ?Having fewer than three bowel movements in a week. ?Difficulty having a bowel movement. ?Stools that are dry, hard, or larger than normal. ?You have a urinary catheter in place, and you have: ?Spasms or pain. ?Problems with your catheter or your catheter is blocked. ?Your catheter has been taken out but you are unable to urinate. ?You have signs of infection, such as: ?Fever or chills. ?Cloudy or bad-smelling urine. ?Get help right away if: ?You have severe abdominal pain that gets worse or does not improve with medicine. ?You have a lot of large blood clots in your urine. ?You develop swelling or pain in your leg. ?You have difficulty breathing. ?These symptoms may be an emergency. Get help right away. Call 911. ?Do not wait to see if the symptoms will go away. ?Do not drive yourself to the hospital. ?Summary ?After your procedure, it is common to have a small amount of blood or small blood clots in your urine, soreness or mild pain from your catheter, and pain in your lower abdomen. ?Take over-the-counter and prescription medicines only as told by your health care provider. ?Rest as told by your health care provider. Follow your health care  provider's instructions about returning to normal activities. Ask what activities are safe for you. ?If you have a catheter, follow instructions from your health care provider about caring for your catheter and your drainage bag. ?This information is not intended to replace advice given to you by your health care provider. Make sure you discuss any questions you have with your health care provider. ?Document Revised: 04/01/2021 Document Reviewed: 04/01/2021 ?Elsevier Patient Education ? [employer]. ?General Anesthesia, Adult, Care After ?This sheet gives you information about how to care for yourself after your procedure. Your health care provider may also give you more specific instructions. If you have problems or questions, contact your health care provider. ?What can I expect after the procedure? ?After the procedure, the following side effects are common: ?Pain or discomfort at the IV site. ?Nausea. ?Vomiting. ?Sore throat. ?Trouble  concentrating. ?Feeling cold or chills. ?Feeling weak or tired. ?Sleepiness and fatigue. ?Soreness and body aches. These side effects can affect parts of the body that were not involved in surgery. ?Follow these instructions at home: ?For the time period you were told by your health care provider: ? ?Rest. ?Do not participate in activities where you could fall or become injured. ?Do not drive or use machinery. ?Do not drink alcohol. ?Do not take sleeping pills or medicines that cause drowsiness. ?Do not make important decisions or sign legal documents. ?Do not take care of children on your own. ?Eating and drinking ?Follow any instructions from your health care provider about eating or drinking restrictions. ?When you feel hungry, start by eating small amounts of foods that are soft and easy to digest (bland), such as toast. Gradually return to your regular diet. ?Drink enough fluid to keep your urine pale yellow. ?If you vomit, rehydrate by drinking water, juice, or clear  broth. ?General instructions ?If you have sleep apnea, surgery and certain medicines can increase your risk for breathing problems. Follow instructions from your health care provider about wearing your sleep device: ?An

## 2021-08-04 ENCOUNTER — Encounter (HOSPITAL_COMMUNITY)
Admission: RE | Admit: 2021-08-04 | Discharge: 2021-08-04 | Disposition: A | Discharge status: Home / Self Care | Payer: 59 | Source: Ambulatory Visit | Attending: Urology | Admitting: Urology

## 2021-08-04 ENCOUNTER — Encounter (HOSPITAL_COMMUNITY): Payer: Self-pay

## 2021-08-04 VITALS — BP 118/60 | HR 70 | Temp 97.8°F | Resp 18 | Ht 65.0 in | Wt 156.0 lb

## 2021-08-04 DIAGNOSIS — Z01812 Encounter for preprocedural laboratory examination: Secondary | ICD-10-CM | POA: Insufficient documentation

## 2021-08-04 DIAGNOSIS — D509 Iron deficiency anemia, unspecified: Secondary | ICD-10-CM

## 2021-08-04 LAB — CBC WITH DIFFERENTIAL/PLATELET
Abs Immature Granulocytes: 0 10*3/uL (ref 0.00–0.07)
Basophils Absolute: 0.1 10*3/uL (ref 0.0–0.1)
Basophils Relative: 1 %
Eosinophils Absolute: 0.2 10*3/uL (ref 0.0–0.5)
Eosinophils Relative: 4 %
HCT: 39.2 % (ref 36.0–46.0)
Hemoglobin: 12.8 g/dL (ref 12.0–15.0)
Immature Granulocytes: 0 %
Lymphocytes Relative: 30 %
Lymphs Abs: 1.6 10*3/uL (ref 0.7–4.0)
MCH: 31.5 pg (ref 26.0–34.0)
MCHC: 32.7 g/dL (ref 30.0–36.0)
MCV: 96.6 fL (ref 80.0–100.0)
Monocytes Absolute: 0.4 10*3/uL (ref 0.1–1.0)
Monocytes Relative: 7 %
Neutro Abs: 3.1 10*3/uL (ref 1.7–7.7)
Neutrophils Relative %: 58 %
Platelets: 225 10*3/uL (ref 150–400)
RBC: 4.06 MIL/uL (ref 3.87–5.11)
RDW: 11.7 % (ref 11.5–15.5)
WBC: 5.3 10*3/uL (ref 4.0–10.5)
nRBC: 0 % (ref 0.0–0.2)

## 2021-08-08 ENCOUNTER — Ambulatory Visit (HOSPITAL_COMMUNITY)
Admission: RE | Admit: 2021-08-08 | Discharge: 2021-08-08 | Disposition: A | Discharge status: Home / Self Care | Payer: 59 | Attending: Urology | Admitting: Urology

## 2021-08-08 ENCOUNTER — Encounter (HOSPITAL_COMMUNITY): Payer: Self-pay | Admitting: Urology

## 2021-08-08 ENCOUNTER — Encounter (HOSPITAL_COMMUNITY)
Admission: RE | Disposition: A | Discharge status: Home / Self Care | Payer: Self-pay | Source: Home / Self Care | Attending: Urology

## 2021-08-08 ENCOUNTER — Ambulatory Visit (HOSPITAL_BASED_OUTPATIENT_CLINIC_OR_DEPARTMENT_OTHER): Payer: 59 | Admitting: Anesthesiology

## 2021-08-08 ENCOUNTER — Ambulatory Visit (HOSPITAL_COMMUNITY): Payer: 59

## 2021-08-08 ENCOUNTER — Ambulatory Visit (HOSPITAL_COMMUNITY): Payer: 59 | Admitting: Anesthesiology

## 2021-08-08 DIAGNOSIS — Z87891 Personal history of nicotine dependence: Secondary | ICD-10-CM | POA: Insufficient documentation

## 2021-08-08 DIAGNOSIS — D494 Neoplasm of unspecified behavior of bladder: Secondary | ICD-10-CM | POA: Diagnosis not present

## 2021-08-08 DIAGNOSIS — C672 Malignant neoplasm of lateral wall of bladder: Secondary | ICD-10-CM | POA: Insufficient documentation

## 2021-08-08 HISTORY — PX: TRANSURETHRAL RESECTION OF BLADDER TUMOR: SHX2575

## 2021-08-08 HISTORY — PX: CYSTOSCOPY W/ RETROGRADES: SHX1426

## 2021-08-08 SURGERY — CYSTOSCOPY, WITH RETROGRADE PYELOGRAM
Anesthesia: General | Site: Ureter

## 2021-08-08 MED ORDER — LACTATED RINGERS IV SOLN
INTRAVENOUS | Status: DC
  Administered 2021-08-08: 1000 mL via INTRAVENOUS

## 2021-08-08 MED ORDER — ROCURONIUM BROMIDE 100 MG/10ML IV SOLN
INTRAVENOUS | Status: DC | PRN
  Administered 2021-08-08: 35 mg via INTRAVENOUS

## 2021-08-08 MED ORDER — SODIUM CHLORIDE 0.9 % IR SOLN
Status: DC | PRN
  Administered 2021-08-08 (×2): 3000 mL

## 2021-08-08 MED ORDER — CHLORHEXIDINE GLUCONATE 0.12 % MT SOLN
15.0000 mL | Freq: Once | OROMUCOSAL | Status: AC
  Administered 2021-08-08: 15 mL via OROMUCOSAL

## 2021-08-08 MED ORDER — ONDANSETRON HCL 4 MG/2ML IJ SOLN
INTRAMUSCULAR | Status: DC | PRN
Start: 2021-08-08 — End: 2021-08-08
  Administered 2021-08-08: 4 mg via INTRAVENOUS

## 2021-08-08 MED ORDER — FENTANYL CITRATE (PF) 100 MCG/2ML IJ SOLN
INTRAMUSCULAR | Status: DC | PRN
  Administered 2021-08-08: 100 ug via INTRAVENOUS

## 2021-08-08 MED ORDER — FENTANYL CITRATE (PF) 100 MCG/2ML IJ SOLN
INTRAMUSCULAR | Status: AC
  Filled 2021-08-08: qty 2

## 2021-08-08 MED ORDER — SUGAMMADEX SODIUM 500 MG/5ML IV SOLN
INTRAVENOUS | Status: DC | PRN
Start: 2021-08-08 — End: 2021-08-08
  Administered 2021-08-08: 200 mg via INTRAVENOUS

## 2021-08-08 MED ORDER — CEFAZOLIN SODIUM-DEXTROSE 2-4 GM/100ML-% IV SOLN
2.0000 g | INTRAVENOUS | Status: AC
  Administered 2021-08-08: 2 g via INTRAVENOUS
  Filled 2021-08-08: qty 100

## 2021-08-08 MED ORDER — ROCURONIUM BROMIDE 10 MG/ML (PF) SYRINGE
PREFILLED_SYRINGE | INTRAVENOUS | Status: AC
  Filled 2021-08-08: qty 10

## 2021-08-08 MED ORDER — PROPOFOL 10 MG/ML IV BOLUS
INTRAVENOUS | Status: DC | PRN
  Administered 2021-08-08: 140 mg via INTRAVENOUS

## 2021-08-08 MED ORDER — DIATRIZOATE MEGLUMINE 30 % UR SOLN
URETHRAL | Status: AC
  Filled 2021-08-08: qty 100

## 2021-08-08 MED ORDER — LIDOCAINE HCL (CARDIAC) PF 50 MG/5ML IV SOSY
PREFILLED_SYRINGE | INTRAVENOUS | Status: DC | PRN
  Administered 2021-08-08: 60 mg via INTRAVENOUS

## 2021-08-08 MED ORDER — DIATRIZOATE MEGLUMINE 30 % UR SOLN
URETHRAL | Status: DC | PRN
  Administered 2021-08-08: 15 mL via URETHRAL

## 2021-08-08 MED ORDER — HYDROCODONE-ACETAMINOPHEN 7.5-325 MG PO TABS: 1.0000 | ORAL_TABLET | Freq: Four times a day (QID) | ORAL | 0 refills | Status: DC | PRN

## 2021-08-08 MED ORDER — LIDOCAINE HCL (PF) 2 % IJ SOLN
INTRAMUSCULAR | Status: AC
  Filled 2021-08-08: qty 10

## 2021-08-08 MED ORDER — STERILE WATER FOR IRRIGATION IR SOLN
Status: DC | PRN
  Administered 2021-08-08: 500 mL

## 2021-08-08 MED ORDER — ORAL CARE MOUTH RINSE: 15.0000 mL | Freq: Once | OROMUCOSAL | Status: AC

## 2021-08-08 MED ORDER — FENTANYL CITRATE PF 50 MCG/ML IJ SOSY: 25.0000 ug | PREFILLED_SYRINGE | INTRAMUSCULAR | Status: DC | PRN

## 2021-08-08 MED ORDER — ONDANSETRON HCL 4 MG/2ML IJ SOLN: 4.0000 mg | Freq: Once | INTRAMUSCULAR | Status: DC | PRN

## 2021-08-08 SURGICAL SUPPLY — 31 items
BAG DRAIN URO TABLE W/ADPT NS (BAG) ×3 IMPLANT
BAG DRN 8 ADPR NS SKTRN CSTL (BAG) ×2
BAG DRN RND TRDRP ANRFLXCHMBR (UROLOGICAL SUPPLIES) ×2
BAG HAMPER (MISCELLANEOUS) ×3 IMPLANT
BAG URINE DRAIN 2000ML AR STRL (UROLOGICAL SUPPLIES) ×3 IMPLANT
CATH FOLEY 2WAY SLVR  5CC 18FR (CATHETERS) ×1
CATH FOLEY 2WAY SLVR 5CC 18FR (CATHETERS) IMPLANT
CATH INTERMIT  6FR 70CM (CATHETERS) ×3 IMPLANT
CLOTH BEACON ORANGE TIMEOUT ST (SAFETY) ×3 IMPLANT
ELECT LOOP 22F BIPOLAR SML (ELECTROSURGICAL) ×3
ELECTRODE LOOP 22F BIPOLAR SML (ELECTROSURGICAL) ×2 IMPLANT
GLOVE BIO SURGEON STRL SZ8 (GLOVE) ×3 IMPLANT
GLOVE BIOGEL PI IND STRL 6.5 (GLOVE) IMPLANT
GLOVE BIOGEL PI IND STRL 7.0 (GLOVE) ×4 IMPLANT
GLOVE BIOGEL PI INDICATOR 6.5 (GLOVE) ×1
GLOVE BIOGEL PI INDICATOR 7.0 (GLOVE) ×1
GLOVE SS BIOGEL STRL SZ 6.5 (GLOVE) IMPLANT
GLOVE SUPERSENSE BIOGEL SZ 6.5 (GLOVE) ×1
GOWN STRL REUS W/TWL LRG LVL3 (GOWN DISPOSABLE) ×3 IMPLANT
GOWN STRL REUS W/TWL XL LVL3 (GOWN DISPOSABLE) ×3 IMPLANT
GUIDEWIRE STR DUAL SENSOR (WIRE) IMPLANT
IV NS IRRIG 3000ML ARTHROMATIC (IV SOLUTION) ×6 IMPLANT
KIT TURNOVER CYSTO (KITS) ×3 IMPLANT
MANIFOLD NEPTUNE II (INSTRUMENTS) ×3 IMPLANT
PACK CYSTO (CUSTOM PROCEDURE TRAY) ×3 IMPLANT
PAD ARMBOARD 7.5X6 YLW CONV (MISCELLANEOUS) ×3 IMPLANT
SYR 30ML LL (SYRINGE) ×1 IMPLANT
SYR TOOMEY IRRIG 70ML (MISCELLANEOUS) ×3
SYRINGE TOOMEY IRRIG 70ML (MISCELLANEOUS) IMPLANT
TOWEL OR 17X26 4PK STRL BLUE (TOWEL DISPOSABLE) ×3 IMPLANT
WATER STERILE IRR 500ML POUR (IV SOLUTION) ×3 IMPLANT

## 2021-08-08 NOTE — Transfer of Care (Signed)
Immediate Anesthesia Transfer of Care Note ? ?Patient: Julie Villa ? ?Procedure(s) Performed: CYSTOSCOPY WITH RETROGRADE PYELOGRAM (Bilateral: Ureter) ?TRANSURETHRAL RESECTION OF BLADDER TUMOR (TURBT) (Bladder) ? ?Patient Location: PACU ? ?Anesthesia Type:General ? ?Level of Consciousness: drowsy and patient cooperative ? ?Airway & Oxygen Therapy: Patient Spontanous Breathing and Patient connected to nasal cannula oxygen ? ?Post-op Assessment: Report given to RN and Post -op Vital signs reviewed and stable ? ?Post vital signs: Reviewed and stable ? ?Last Vitals:  ?Vitals Value Taken Time  ?BP 119/66 08/08/21 1015  ?Temp 97.6 1017  ?Pulse 70 08/08/21 1016  ?Resp 14 08/08/21 1016  ?SpO2 94 % 08/08/21 1016  ?Vitals shown include unvalidated device data. ? ?Last Pain:  ?Vitals:  ? 08/08/21 0853  ?TempSrc: Oral  ?PainSc: 0-No pain  ?   ? ?Patients Stated Pain Goal: 6 (08/08/21 5749) ? ?Complications: No notable events documented. ?

## 2021-08-08 NOTE — Anesthesia Procedure Notes (Signed)
Procedure Name: Intubation ?Date/Time: 08/08/2021 9:37 AM ?Performed by: Vista Deck, CRNA ?Pre-anesthesia Checklist: Patient identified, Patient being monitored, Timeout performed, Emergency Drugs available and Suction available ?Patient Re-evaluated:Patient Re-evaluated prior to induction ?Oxygen Delivery Method: Circle system utilized ?Preoxygenation: Pre-oxygenation with 100% oxygen ?Induction Type: IV induction ?Ventilation: Mask ventilation without difficulty ?Laryngoscope Size: Mac and 3 ?Grade View: Grade I ?Tube type: Oral ?Tube size: 7.0 mm ?Number of attempts: 1 ?Airway Equipment and Method: Stylet ?Placement Confirmation: ETT inserted through vocal cords under direct vision, positive ETCO2 and breath sounds checked- equal and bilateral ?Secured at: 21 cm ?Tube secured with: Tape ?Dental Injury: Teeth and Oropharynx as per pre-operative assessment  ?Comments: Intubation performed by Tressie Stalker CRNA ? ? ? ? ?

## 2021-08-08 NOTE — Anesthesia Postprocedure Evaluation (Signed)
Anesthesia Post Note ? ?Patient: Troy Sine ? ?Procedure(s) Performed: CYSTOSCOPY WITH RETROGRADE PYELOGRAM (Bilateral: Ureter) ?TRANSURETHRAL RESECTION OF BLADDER TUMOR (TURBT) (Bladder) ? ?Patient location during evaluation: Phase II ?Anesthesia Type: General ?Level of consciousness: awake ?Pain management: pain level controlled ?Vital Signs Assessment: post-procedure vital signs reviewed and stable ?Respiratory status: spontaneous breathing and respiratory function stable ?Cardiovascular status: blood pressure returned to baseline and stable ?Postop Assessment: no headache and no apparent nausea or vomiting ?Anesthetic complications: no ?Comments: Late entry ? ? ?No notable events documented. ? ? ?Last Vitals:  ?Vitals:  ? 08/08/21 1115 08/08/21 1125  ?BP: 134/74 128/63  ?Pulse: (!) 44 (!) 48  ?Resp: 11 15  ?Temp:  36.6 ?C  ?SpO2: 100% 94%  ?  ?Last Pain:  ?Vitals:  ? 08/08/21 1125  ?TempSrc: Oral  ?PainSc: 0-No pain  ? ? ?  ?  ?  ?  ?  ?  ? ?Louann Sjogren ? ? ? ? ?

## 2021-08-08 NOTE — Anesthesia Preprocedure Evaluation (Signed)
Anesthesia Evaluation  ?Patient identified by MRN, date of birth, ID band ?Patient awake ? ? ? ?Reviewed: ?Allergy & Precautions, H&P , NPO status , Patient's Chart, lab work & pertinent test results, reviewed documented beta blocker date and time  ? ?Airway ?Mallampati: II ? ?TM Distance: >3 FB ?Neck ROM: full ? ? ? Dental ?no notable dental hx. ? ?  ?Pulmonary ?neg pulmonary ROS, former smoker,  ?  ?Pulmonary exam normal ?breath sounds clear to auscultation ? ? ? ? ? ? Cardiovascular ?Exercise Tolerance: Good ?negative cardio ROS ? ? ?Rhythm:regular Rate:Normal ? ? ?  ?Neuro/Psych ?negative neurological ROS ? negative psych ROS  ? GI/Hepatic ?negative GI ROS, Neg liver ROS,   ?Endo/Other  ?negative endocrine ROS ? Renal/GU ?negative Renal ROS  ?negative genitourinary ?  ?Musculoskeletal ? ? Abdominal ?  ?Peds ? Hematology ? ?(+) Blood dyscrasia, anemia ,   ?Anesthesia Other Findings ? ? Reproductive/Obstetrics ?negative OB ROS ? ?  ? ? ? ? ? ? ? ? ? ? ? ? ? ?  ?  ? ? ? ? ? ? ? ? ?Anesthesia Physical ?Anesthesia Plan ? ?ASA: 2 ? ?Anesthesia Plan: General and General LMA  ? ?Post-op Pain Management:   ? ?Induction:  ? ?PONV Risk Score and Plan: Ondansetron ? ?Airway Management Planned:  ? ?Additional Equipment:  ? ?Intra-op Plan:  ? ?Post-operative Plan:  ? ?Informed Consent: I have reviewed the patients History and Physical, chart, labs and discussed the procedure including the risks, benefits and alternatives for the proposed anesthesia with the patient or authorized representative who has indicated his/her understanding and acceptance.  ? ? ? ?Dental Advisory Given ? ?Plan Discussed with: CRNA ? ?Anesthesia Plan Comments:   ? ? ? ? ? ? ?Anesthesia Quick Evaluation ? ?

## 2021-08-08 NOTE — Op Note (Signed)
.  Preoperative diagnosis: bladder tumor ? ?Postoperative diagnosis: Same ? ?Procedure: 1 cystoscopy ?2. bilateral retrograde pyelography ?3.  Intraoperative fluoroscopy, under one hour, with interpretation ?4. Transurethral resection of bladder tumor, small ? ?Attending: Rosie Fate ? ?Anesthesia: General ? ?Estimated blood loss: Minimal ? ?Drains: 91 French foley ? ?Specimens: bladder tumor ? ?Antibiotics: ancef ? ?Findings: 2cm papillary right lateral wall tumor.  Ureteral orifices in normal anatomic location. No hydronephrosis or filling defects in either collecting system ? ?Indications: Patient is a 61 year old female with a history of bladder tumor found on office cystoscopy.  After discussing treatment options, they decided proceed with transurethral resection of a bladder tumor. ? ?Procedure in detail: The patient was brought to the operating room and a brief timeout was done to ensure correct patient, correct procedure, correct site.  General anesthesia was administered patient was placed in dorsal lithotomy position.  Their genitalia was then prepped and draped in usual sterile fashion.  A rigid 68 French cystoscope was passed in the urethra and the bladder.  Bladder was inspected and we noted a 2cm bladder tumor.  the ureteral orifices were in the normal orthotopic locations.  a 6 french ureteral catheter was then instilled into the left ureteral orifice.  a gentle retrograde was obtained and findings noted above. We then turned our attention to the right side. a 6 french ureteral catheter was then instilled into the right ureteral orifice.  a gentle retrograde was obtained and findings noted above. We then removed the cystoscope and placed a resectoscope into the bladder. We proceeded to remove the large clot burden from the bladder. Once this was complete we turned our attention to the bladder tumor. Using the bipolar resectoscope we removed the bladder tumor down to the base. A subsequent muscle  deep biopsy was then taken. Hemostasis was then obtained with electrocautery. We then removed the bladder tumor chips and sent them for pathology. We then re-inspected the bladder and found no residula bleeding.  the bladder was then drained, a 22 French foley was placed and this concluded the procedure which was well tolerated by patient. ? ?Complications: None ? ?Condition: Stable, extubated, transferred to PACU ? ?Plan: Patient is to be discharged home and followup in 5 days for foley catheter removal and pathology discussion.  ?

## 2021-08-08 NOTE — H&P (Signed)
Urology Admission H&P ? ?Chief Complaint: bladder tumor ? ?History of Present Illness: Julie Villa is a 61yo here for bladder tumor resection. She was found to have recurrent bladder tumor on office cystoscopy. No significatn LUTS ? ?Past Medical History:  ?Diagnosis Date  ? Anemia   ? hx of several yrs ago  ? Bladder tumor   ? Breast cancer ([facility]) 2012  ? left  ? Frequency of urination   ? Hematuria   ? History of left breast cancer dx 2012  --- oncologist-  dr Lindi Adie ([facility])-- per lov note, no recurrence  ? Left UIQ - Invasive ductial carcinoma, Invasive Lobular carcinoma w/ LCIS; Stage IA (T1b, N0, M0)--- s/p breast lumpectomy w/ snl dissection and radiation therapy completed 01-03-2011  ? History of radiation therapy completed 01-03-2011  ? total 61Gy  , left breast   ? History of radiation therapy 2012  ? Personal history of radiation therapy   ? Wears glasses   ? ?Past Surgical History:  ?Procedure Laterality Date  ? APPENDECTOMY  1987  ? BREAST LUMPECTOMY Left 2012  ? BREAST LUMPECTOMY WITH AXILLARY LYMPH NODE DISSECTION Left 09-21-2010  dr Margot Chimes  [facility]  ? CATARACT EXTRACTION W/ INTRAOCULAR LENS IMPLANT Right 06/2016  ? CYSTOSCOPY N/A 02/05/2020  ? Procedure: CYSTOSCOPY;  Surgeon: Cleon Gustin, MD;  Location: [facility];  Service: Urology;  Laterality: N/A;  ? CYSTOSCOPY W/ RETROGRADES Bilateral 01/19/2017  ? Procedure: CYSTOSCOPY WITH RETROGRADE PYELOGRAM;  Surgeon: Cleon Gustin, MD;  Location: [facility];  Service: Urology;  Laterality: Bilateral;  ? NASAL SINUS SURGERY  06/2008  ? TRANSURETHRAL RESECTION OF BLADDER TUMOR N/A 01/19/2017  ? Procedure: TRANSURETHRAL RESECTION OF BLADDER TUMOR (TURBT);  Surgeon: Cleon Gustin, MD;  Location: [facility];  Service: Urology;  Laterality: N/A;  ? TRANSURETHRAL RESECTION OF BLADDER TUMOR N/A 11/29/2017  ? Procedure: TRANSURETHRAL RESECTION OF BLADDER TUMOR (TURBT);  Surgeon: Cleon Gustin, MD;  Location:  [facility];  Service: Urology;  Laterality: N/A;  ? TRANSURETHRAL RESECTION OF BLADDER TUMOR N/A 02/05/2020  ? Procedure: TRANSURETHRAL RESECTION OF BLADDER TUMOR (TURBT);  Surgeon: Cleon Gustin, MD;  Location: [facility];  Service: Urology;  Laterality: N/A;  ? ? ?Home Medications:  ?Current Facility-Administered Medications  ?Medication Dose Route Frequency Provider Last Rate Last Admin  ? ceFAZolin (ANCEF) IVPB 2g/100 mL premix  2 g Intravenous 30 min Pre-Op Leyana Whidden, Candee Furbish, MD      ? ?Allergies:  ?Allergies  ?Allergen Reactions  ? Penicillins Rash  ? Sulfa Antibiotics Rash  ? ? ?Family History  ?Problem Relation Age of Onset  ? Breast cancer Mother   ? Cancer Mother   ?     breast  ? Hypertension Mother   ? Hyperlipidemia Mother   ? Diabetes Father   ? Heart disease Father   ? Hyperlipidemia Father   ? Hypertension Father   ? Diabetes Sister   ? Hypertension Sister   ? Hyperlipidemia Sister   ? ?Social History:  reports that she quit smoking about 39 years ago. Her smoking use included cigarettes. She has never used smokeless tobacco. She reports current alcohol use of about 1.0 standard drink per week. She reports that she does not use drugs. ? ?Review of Systems  ?All other systems reviewed and are negative. ? ?Physical Exam:  ?Vital signs in last 24 hours: ?Temp:  [98.4 ?F (36.9 ?C)] 98.4 ?F (36.9 ?C) (05/01 2353) ?Pulse Rate:  [  49] 49 (05/01 0853) ?Resp:  [14] 14 (05/01 0853) ?BP: (130)/(72) 130/72 (05/01 0853) ?SpO2:  [100 %] 100 % (05/01 0853) ?Weight:  [70.8 kg] 70.8 kg (05/01 0853) ?Physical Exam ?Constitutional:   ?   Appearance: Normal appearance.  ?HENT:  ?   Head: Normocephalic and atraumatic.  ?   Nose: Rhinorrhea present. No congestion.  ?   Mouth/Throat:  ?   Mouth: Mucous membranes are dry.  ?Eyes:  ?   Extraocular Movements: Extraocular movements intact.  ?   Pupils: Pupils are equal, round, and reactive to light.  ?Cardiovascular:  ?   Rate and Rhythm: Normal rate and  regular rhythm.  ?Pulmonary:  ?   Effort: Pulmonary effort is normal. No respiratory distress.  ?Abdominal:  ?   General: Abdomen is flat. There is no distension.  ?Musculoskeletal:     ?   General: No swelling. Normal range of motion.  ?   Cervical back: Normal range of motion and neck supple.  ?Skin: ?   General: Skin is warm and dry.  ?Neurological:  ?   General: No focal deficit present.  ?   Mental Status: She is alert and oriented to person, place, and time.  ?Psychiatric:     ?   Mood and Affect: Mood normal.     ?   Behavior: Behavior normal.     ?   Thought Content: Thought content normal.     ?   Judgment: Judgment normal.  ? ? ?Laboratory Data:  ?No results found for this or any previous visit (from the past 24 hour(s)). ?No results found for this or any previous visit (from the past 240 hour(s)). ?Creatinine: ?No results for input(s): CREATININE in the last 168 hours. ?Baseline Creatinine: unknown ? ?Impression/Assessment:  ?61yo with bladder tumor ? ?Plan:  ?Risks/benefits/alternatives to cystoscopy with bladder tumor resection was explained to the patient and she understands and wishes to proceed with surgery ? ?Nicolette Bang ?08/08/2021, 9:03 AM  ? ? ? ? ? ?

## 2021-08-09 ENCOUNTER — Other Ambulatory Visit: Payer: Self-pay | Admitting: Urology

## 2021-08-09 LAB — SURGICAL PATHOLOGY

## 2021-08-09 MED ORDER — HYDROCODONE-ACETAMINOPHEN 10-325 MG PO TABS: 1.0000 | ORAL_TABLET | Freq: Four times a day (QID) | ORAL | 0 refills | Status: DC | PRN

## 2021-08-10 ENCOUNTER — Encounter (HOSPITAL_COMMUNITY): Payer: Self-pay | Admitting: Urology

## 2021-08-12 ENCOUNTER — Encounter: Payer: Self-pay | Admitting: Urology

## 2021-08-12 ENCOUNTER — Ambulatory Visit: Payer: 59 | Admitting: Urology

## 2021-08-12 VITALS — BP 112/69 | HR 66

## 2021-08-12 DIAGNOSIS — Z8551 Personal history of malignant neoplasm of bladder: Secondary | ICD-10-CM

## 2021-08-12 DIAGNOSIS — C672 Malignant neoplasm of lateral wall of bladder: Secondary | ICD-10-CM

## 2021-08-12 NOTE — Patient Instructions (Signed)

## 2021-08-12 NOTE — Progress Notes (Signed)
? ?08/12/2021 ?12:26 PM  ? ?Julie Villa ?09/13/1960 ?[id number] ? ?Referring provider: Orpah Melter, MD ?[address] ?[place],  [place] [address] ? ?Followup bladder cancer ? ? ?HPI: ?Julie Villa is a 61yo here for followup for bladder cancer. She underwent TURBT 5/1. Pathology TaG1. She has had mutliple recurrance in the past year. No significant LUTS. No hematuria. Voiding trial passed today ? ? ?PMH: ?Past Medical History:  ?Diagnosis Date  ? Anemia   ? hx of several yrs ago  ? Bladder tumor   ? Breast cancer ([place]) 2012  ? left  ? Frequency of urination   ? Hematuria   ? History of left breast cancer dx 2012  --- oncologist-  dr Lindi Adie ([facility])-- per lov note, no recurrence  ? Left UIQ - Invasive ductial carcinoma, Invasive Lobular carcinoma w/ LCIS; Stage IA (T1b, N0, M0)--- s/p breast lumpectomy w/ snl dissection and radiation therapy completed 01-03-2011  ? History of radiation therapy completed 01-03-2011  ? total 61Gy  , left breast   ? History of radiation therapy 2012  ? Personal history of radiation therapy   ? Wears glasses   ? ? ?Surgical History: ?Past Surgical History:  ?Procedure Laterality Date  ? APPENDECTOMY  1987  ? BREAST LUMPECTOMY Left 2012  ? BREAST LUMPECTOMY WITH AXILLARY LYMPH NODE DISSECTION Left 09-21-2010  dr Margot Chimes  [facility]  ? CATARACT EXTRACTION W/ INTRAOCULAR LENS IMPLANT Right 06/2016  ? CYSTOSCOPY N/A 02/05/2020  ? Procedure: CYSTOSCOPY;  Surgeon: Cleon Gustin, MD;  Location: [facility];  Service: Urology;  Laterality: N/A;  ? CYSTOSCOPY W/ RETROGRADES Bilateral 01/19/2017  ? Procedure: CYSTOSCOPY WITH RETROGRADE PYELOGRAM;  Surgeon: Cleon Gustin, MD;  Location[facility];  Service: Urology;  Laterality: Bilateral;  ? CYSTOSCOPY W/ RETROGRADES Bilateral 08/08/2021  ? Procedure: CYSTOSCOPY WITH RETROGRADE PYELOGRAM;  Surgeon: Cleon Gustin, MD;  Location: [facility];  Service: Urology;  Laterality: Bilateral;  ? NASAL SINUS SURGERY   06/2008  ? TRANSURETHRAL RESECTION OF BLADDER TUMOR N/A 01/19/2017  ? Procedure: TRANSURETHRAL RESECTION OF BLADDER TUMOR (TURBT);  Surgeon: Cleon Gustin, MD;  Location: [facility];  Service: Urology;  Laterality: N/A;  ? TRANSURETHRAL RESECTION OF BLADDER TUMOR N/A 11/29/2017  ? Procedure: TRANSURETHRAL RESECTION OF BLADDER TUMOR (TURBT);  Surgeon: Cleon Gustin, MD;  Location: [facility];  Service: Urology;  Laterality: N/A;  ? TRANSURETHRAL RESECTION OF BLADDER TUMOR N/A 02/05/2020  ? Procedure: TRANSURETHRAL RESECTION OF BLADDER TUMOR (TURBT);  Surgeon: Cleon Gustin, MD;  Location: [facility];  Service: Urology;  Laterality: N/A;  ? TRANSURETHRAL RESECTION OF BLADDER TUMOR N/A 08/08/2021  ? Procedure: TRANSURETHRAL RESECTION OF BLADDER TUMOR (TURBT);  Surgeon: Cleon Gustin, MD;  Location: [facility];  Service: Urology;  Laterality: N/A;  ? ? ?Home Medications:  ?Allergies as of 08/12/2021   ? ?   Reactions  ? Penicillins Rash  ? Sulfa Antibiotics Rash  ? ?  ? ?  ?Medication List  ?  ? ?  ? Accurate as of Aug 12, 2021 12:26 PM. If you have any questions, ask your nurse or doctor.  ?  ?  ? ?  ? ?budesonide 3 MG 24 hr capsule ?Commonly known as: ENTOCORT EC ?Take 3 mg by mouth daily. ?  ?CALCIUM 600+D3 PO ?Take 1 tablet by mouth 2 (two) times daily. ?  ?Cranberry 450 MG Caps ?Take 900 mg by mouth daily. ?  ?  HYDROcodone-acetaminophen 10-325 MG tablet ?Commonly known as: Norco ?Take 1 tablet by mouth every 6 (six) hours as needed. ?  ?Iron 325 (65 Fe) MG Tabs ?Take 325 mg by mouth daily. ?  ?Magnesium 400 MG Tabs ?Take 400 mg by mouth at bedtime. ?  ?nitrofurantoin 50 MG capsule ?Commonly known as: MACRODANTIN ?Take 1 capsule (50 mg total) by mouth at bedtime. ?  ?PROBIOTIC-10 PO ?Take 1 capsule by mouth daily. ?  ?simvastatin 20 MG tablet ?Commonly known as: ZOCOR ?Take 20 mg by mouth every evening. ?  ?solifenacin 10 MG tablet ?Commonly known as: VESICARE ?Take 10 mg by  mouth every morning. ?  ? ?  ? ? ?Allergies:  ?Allergies  ?Allergen Reactions  ? Penicillins Rash  ? Sulfa Antibiotics Rash  ? ? ?Family History: ?Family History  ?Problem Relation Age of Onset  ? Breast cancer Mother   ? Cancer Mother   ?     breast  ? Hypertension Mother   ? Hyperlipidemia Mother   ? Diabetes Father   ? Heart disease Father   ? Hyperlipidemia Father   ? Hypertension Father   ? Diabetes Sister   ? Hypertension Sister   ? Hyperlipidemia Sister   ? ? ?Social History:  reports that she quit smoking about 39 years ago. Her smoking use included cigarettes. She has never used smokeless tobacco. She reports current alcohol use of about 1.0 standard drink per week. She reports that she does not use drugs. ? ?ROS: ?All other review of systems were reviewed and are negative except what is noted above in HPI ? ?Physical Exam: ?BP 112/69   Pulse 66   LMP 04/06/2011   ?Constitutional:  Alert and oriented, No acute distress. ?HEENT: [place] AT, moist mucus membranes.  Trachea midline, no masses. ?Cardiovascular: No clubbing, cyanosis, or edema. ?Respiratory: Normal respiratory effort, no increased work of breathing. ?GI: Abdomen is soft, nontender, nondistended, no abdominal masses ?GU: No CVA tenderness.  ?Lymph: No cervical or inguinal lymphadenopathy. ?Skin: No rashes, bruises or suspicious lesions. ?Neurologic: Grossly intact, no focal deficits, moving all 4 extremities. ?Psychiatric: Normal mood and affect. ? ?Laboratory Data: ?Lab Results  ?Component Value Date  ? WBC 5.3 08/04/2021  ? HGB 12.8 08/04/2021  ? HCT 39.2 08/04/2021  ? MCV 96.6 08/04/2021  ? PLT 225 08/04/2021  ? ? ?Lab Results  ?Component Value Date  ? CREATININE 0.93 01/26/2021  ? ? ?No results found for: PSA ? ?No results found for: TESTOSTERONE ? ?No results found for: HGBA1C ? ?Urinalysis ?   ?Component Value Date/Time  ? APPEARANCEUR Clear 07/08/2021 1048  ? GLUCOSEU Negative 07/08/2021 1048  ? BILIRUBINUR Negative 07/08/2021 1048  ?  PROTEINUR Negative 07/08/2021 1048  ? UROBILINOGEN negative (A) 10/22/2019 0930  ? NITRITE Negative 07/08/2021 1048  ? LEUKOCYTESUR Negative 07/08/2021 1048  ? ? ?Lab Results  ?Component Value Date  ? LABMICR Comment 07/08/2021  ? WBCUA >30 (A) 03/17/2021  ? LABEPIT 0-10 03/17/2021  ? BACTERIA Many (A) 03/17/2021  ? ? ?Pertinent Imaging: ? ?No results found for this or any previous visit. ? ?No results found for this or any previous visit. ? ?No results found for this or any previous visit. ? ?No results found for this or any previous visit. ? ?No results found for this or any previous visit. ? ?No results found for this or any previous visit. ? ?Results for orders placed during the hospital encounter of 01/28/21 ? ?CT HEMATURIA WORKUP ? ?Narrative ?CLINICAL  DATA:  Hematuria ? ?EXAM: ?CT ABDOMEN AND PELVIS WITHOUT AND WITH CONTRAST ? ?TECHNIQUE: ?Multidetector CT imaging of the abdomen and pelvis was performed ?following the standard protocol before and following the bolus ?administration of intravenous contrast. ? ?CONTRAST:  120m OMNIPAQUE IOHEXOL 350 MG/ML SOLN ? ?COMPARISON:  None. ? ?FINDINGS: ?Lower chest: No acute abnormality. ? ?Hepatobiliary: No suspicious liver lesions. No gallstones, ?gallbladder wall thickening, or biliary dilatation. ? ?Pancreas: Unremarkable. No pancreatic ductal dilatation or ?surrounding inflammatory changes. ? ?Spleen: Normal in size without focal abnormality. ? ?Adrenals/Urinary Tract: Bilateral adrenal glands are unremarkable. ?No hydronephrosis. No nephrolithiasis. Delayed imaging demonstrates ?no suspicious filling defects of the renal collecting systems, ?although the distal right ureter is incompletely opacified. Bladder ?is unremarkable. ? ?Stomach/Bowel: Stomach is within normal limits. Appendix is ?surgically absent. No evidence of bowel wall thickening, distention, ?or inflammatory changes. ? ?Vascular/Lymphatic: Aortic atherosclerosis. No enlarged abdominal or ?pelvic  lymph nodes. ? ?Reproductive: Uterus and bilateral adnexa are unremarkable. ? ?Other: No abdominal wall hernia or abnormality. No abdominopelvic ?ascites. ? ?Musculoskeletal: No acute or significant osseous find

## 2021-08-16 ENCOUNTER — Ambulatory Visit: Payer: 59 | Admitting: Urology

## 2021-09-13 ENCOUNTER — Ambulatory Visit (INDEPENDENT_AMBULATORY_CARE_PROVIDER_SITE_OTHER): Payer: 59 | Admitting: Physician Assistant

## 2021-09-13 ENCOUNTER — Ambulatory Visit: Payer: 59 | Admitting: Physician Assistant

## 2021-09-13 DIAGNOSIS — N390 Urinary tract infection, site not specified: Secondary | ICD-10-CM

## 2021-09-13 DIAGNOSIS — N3000 Acute cystitis without hematuria: Secondary | ICD-10-CM

## 2021-09-13 LAB — URINALYSIS, ROUTINE W REFLEX MICROSCOPIC
Bilirubin, UA: NEGATIVE
Glucose, UA: NEGATIVE
Ketones, UA: NEGATIVE
Nitrite, UA: NEGATIVE
Protein,UA: NEGATIVE
Specific Gravity, UA: 1.02 (ref 1.005–1.030)
Urobilinogen, Ur: 0.2 mg/dL (ref 0.2–1.0)
pH, UA: 6.5 (ref 5.0–7.5)

## 2021-09-13 LAB — MICROSCOPIC EXAMINATION
Renal Epithel, UA: NONE SEEN /hpf
WBC, UA: >30 /hpf — AB (ref 0–5)

## 2021-09-13 MED ORDER — CEPHALEXIN 250 MG PO CAPS: 250.0000 mg | ORAL_CAPSULE | Freq: Four times a day (QID) | ORAL | 0 refills | Status: DC

## 2021-09-13 NOTE — Progress Notes (Signed)
Pt presents for first BCG and UTI noted. Tx and FU next week for recheck. Keflex sent to pharm

## 2021-09-14 NOTE — Progress Notes (Signed)
Patient unable to get BCG treatment today due to positive ua.  Urine sent for culture.

## 2021-09-16 ENCOUNTER — Other Ambulatory Visit: Payer: Self-pay | Admitting: Physician Assistant

## 2021-09-16 ENCOUNTER — Telehealth: Payer: Self-pay

## 2021-09-16 LAB — URINE CULTURE

## 2021-09-16 MED ORDER — CIPROFLOXACIN HCL 250 MG PO TABS: 250.0000 mg | ORAL_TABLET | Freq: Two times a day (BID) | ORAL | 0 refills | Status: DC

## 2021-09-16 NOTE — Telephone Encounter (Signed)
-----   Message from Reynaldo Minium, [place] sent at 09/16/2021  8:37 AM EDT ----- Please let pt know her cx indicates Cipro would cover UTI better than Keflex. I sent new Rx to her pharmacy ----- Message ----- From: Interface, [facility] Lab Results In Sent: 09/13/2021   6:26 PM EDT To: Reynaldo Minium, PA-C

## 2021-09-16 NOTE — Telephone Encounter (Signed)
Patient called and notified of medication change. Voiced understanding.

## 2021-09-20 ENCOUNTER — Ambulatory Visit (INDEPENDENT_AMBULATORY_CARE_PROVIDER_SITE_OTHER): Payer: 59 | Admitting: Urology

## 2021-09-20 DIAGNOSIS — C672 Malignant neoplasm of lateral wall of bladder: Secondary | ICD-10-CM | POA: Diagnosis not present

## 2021-09-20 MED ORDER — BCG LIVE 50 MG IS SUSR
3.2400 mL | Freq: Once | INTRAVESICAL | Status: AC
  Administered 2021-09-20: 81 mg via INTRAVESICAL

## 2021-09-20 NOTE — Progress Notes (Signed)
BCG Bladder Instillation  BCG # 1 OF 6  Due to Bladder Cancer patient is present today for a BCG treatment. Patient was cleaned and prepped in a sterile fashion with betadine. A 14FR catheter was inserted, urine return was noted 230m, urine was yellow in color.  537mof reconstituted BCG was instilled into the bladder. The catheter was then removed. Patient tolerated well, no complications were noted  Performed by: Florestine Carmical LPN  Follow up/ Additional notes: Keep next scheduled NV

## 2021-09-20 NOTE — Patient Instructions (Signed)

## 2021-09-21 LAB — MICROSCOPIC EXAMINATION: Bacteria, UA: NONE SEEN

## 2021-09-21 LAB — URINALYSIS, ROUTINE W REFLEX MICROSCOPIC
Bilirubin, UA: NEGATIVE
Glucose, UA: NEGATIVE
Ketones, UA: NEGATIVE
Nitrite, UA: NEGATIVE
Protein,UA: NEGATIVE
RBC, UA: NEGATIVE
Specific Gravity, UA: 1.015 (ref 1.005–1.030)
Urobilinogen, Ur: 0.2 mg/dL (ref 0.2–1.0)
pH, UA: 6 (ref 5.0–7.5)

## 2021-09-26 ENCOUNTER — Other Ambulatory Visit: Payer: Self-pay | Admitting: Urology

## 2021-09-27 ENCOUNTER — Ambulatory Visit: Payer: 59 | Admitting: Physician Assistant

## 2021-09-27 ENCOUNTER — Ambulatory Visit (INDEPENDENT_AMBULATORY_CARE_PROVIDER_SITE_OTHER): Payer: 59 | Admitting: Urology

## 2021-09-27 DIAGNOSIS — C672 Malignant neoplasm of lateral wall of bladder: Secondary | ICD-10-CM

## 2021-09-27 LAB — MICROSCOPIC EXAMINATION
Bacteria, UA: NONE SEEN
RBC, Urine: NONE SEEN /hpf (ref 0–2)

## 2021-09-27 LAB — URINALYSIS, ROUTINE W REFLEX MICROSCOPIC
Bilirubin, UA: NEGATIVE
Glucose, UA: NEGATIVE
Ketones, UA: NEGATIVE
Nitrite, UA: NEGATIVE
Protein,UA: NEGATIVE
Specific Gravity, UA: 1.02 (ref 1.005–1.030)
Urobilinogen, Ur: 0.2 mg/dL (ref 0.2–1.0)
pH, UA: 6 (ref 5.0–7.5)

## 2021-09-27 MED ORDER — BCG LIVE 50 MG IS SUSR
3.2400 mL | Freq: Once | INTRAVESICAL | Status: AC
  Administered 2021-09-27: 81 mg via INTRAVESICAL

## 2021-09-27 NOTE — Progress Notes (Unsigned)
BCG Bladder Instillation  BCG # 2 of 6  Due to Bladder Cancer patient is present today for a BCG treatment. Patient was cleaned and prepped in a sterile fashion with betadine. A 14FR catheter was inserted, urine return was noted 237m, urine was yellow in color.  541mof reconstituted BCG was instilled into the bladder. The catheter was then removed. Patient tolerated well, no complications were noted  Performed by: Teola Felipe LPN  Follow up/ Additional notes:Keep next scheduled NV

## 2021-09-29 NOTE — Progress Notes (Signed)
Pt dropped urine for evaluation and appt was canceled.

## 2021-10-04 ENCOUNTER — Ambulatory Visit (INDEPENDENT_AMBULATORY_CARE_PROVIDER_SITE_OTHER): Payer: 59 | Admitting: Urology

## 2021-10-04 DIAGNOSIS — C672 Malignant neoplasm of lateral wall of bladder: Secondary | ICD-10-CM | POA: Diagnosis not present

## 2021-10-04 LAB — URINALYSIS, ROUTINE W REFLEX MICROSCOPIC
Bilirubin, UA: NEGATIVE
Glucose, UA: NEGATIVE
Ketones, UA: NEGATIVE
Nitrite, UA: NEGATIVE
Protein,UA: NEGATIVE
Specific Gravity, UA: 1.015 (ref 1.005–1.030)
Urobilinogen, Ur: 0.2 mg/dL (ref 0.2–1.0)
pH, UA: 7 (ref 5.0–7.5)

## 2021-10-04 LAB — MICROSCOPIC EXAMINATION: Renal Epithel, UA: NONE SEEN /hpf

## 2021-10-04 MED ORDER — BCG LIVE 50 MG IS SUSR
3.2400 mL | Freq: Once | INTRAVESICAL | Status: AC
  Administered 2021-10-04: 81 mg via INTRAVESICAL

## 2021-10-04 NOTE — Progress Notes (Signed)
BCG Bladder Instillation  BCG # 3 of 6  Due to Bladder Cancer patient is present today for a BCG treatment. Patient was cleaned and prepped in a sterile fashion with betadine. A 14FR catheter was inserted, urine return was noted 258m, urine was yellow in color.  575mof reconstituted BCG was instilled into the bladder. The catheter was then removed. Patient tolerated well, no complications were noted  Performed by: Zailen Albarran LPN  Follow up/ Additional notes: keep scheduled f/u nv

## 2021-10-18 ENCOUNTER — Ambulatory Visit: Payer: 59

## 2021-10-18 DIAGNOSIS — C672 Malignant neoplasm of lateral wall of bladder: Secondary | ICD-10-CM

## 2021-10-18 LAB — URINALYSIS, ROUTINE W REFLEX MICROSCOPIC
Bilirubin, UA: NEGATIVE
Glucose, UA: NEGATIVE
Ketones, UA: NEGATIVE
Nitrite, UA: NEGATIVE
Protein,UA: NEGATIVE
Specific Gravity, UA: 1.01 (ref 1.005–1.030)
Urobilinogen, Ur: 0.2 mg/dL (ref 0.2–1.0)
pH, UA: 6 (ref 5.0–7.5)

## 2021-10-18 LAB — MICROSCOPIC EXAMINATION: Renal Epithel, UA: NONE SEEN /hpf

## 2021-10-18 MED ORDER — BCG LIVE 50 MG IS SUSR
3.2400 mL | Freq: Once | INTRAVESICAL | Status: AC
  Administered 2021-10-18: 81 mg via INTRAVESICAL

## 2021-10-18 NOTE — Progress Notes (Unsigned)
BCG Bladder Instillation  BCG # 4 of 6  Due to Bladder Cancer patient is present today for a BCG treatment. Patient was cleaned and prepped in a sterile fashion with betadine. A 14FR catheter was inserted, urine return was noted 230m, urine was yellow in color.  543mof reconstituted BCG was instilled into the bladder. The catheter was then removed. Patient tolerated well, no complications were noted  Performed by: KoLevi AlandCMA  Follow up/ Additional notes: Follow up as scheduled.

## 2021-10-25 ENCOUNTER — Ambulatory Visit (INDEPENDENT_AMBULATORY_CARE_PROVIDER_SITE_OTHER): Payer: 59 | Admitting: Urology

## 2021-10-25 DIAGNOSIS — C672 Malignant neoplasm of lateral wall of bladder: Secondary | ICD-10-CM

## 2021-10-25 LAB — URINALYSIS, ROUTINE W REFLEX MICROSCOPIC
Bilirubin, UA: NEGATIVE
Glucose, UA: NEGATIVE
Ketones, UA: NEGATIVE
Nitrite, UA: NEGATIVE
Protein,UA: NEGATIVE
RBC, UA: NEGATIVE
Specific Gravity, UA: 1.01 (ref 1.005–1.030)
Urobilinogen, Ur: 0.2 mg/dL (ref 0.2–1.0)
pH, UA: 5.5 (ref 5.0–7.5)

## 2021-10-25 LAB — MICROSCOPIC EXAMINATION
RBC, Urine: NONE SEEN /hpf (ref 0–2)
Renal Epithel, UA: NONE SEEN /hpf

## 2021-10-25 MED ORDER — BCG LIVE 50 MG IS SUSR
3.2400 mL | Freq: Once | INTRAVESICAL | Status: AC
  Administered 2021-10-25: 81 mg via INTRAVESICAL

## 2021-10-25 NOTE — Progress Notes (Signed)
BCG Bladder Instillation  BCG # 5 of 6  Due to Bladder Cancer patient is present today for a BCG treatment. Patient was cleaned and prepped in a sterile fashion with betadine. A 14FR catheter was inserted, urine return was noted 27m, urine was yellow in color.  570mof reconstituted BCG was instilled into the bladder. The catheter was then removed. Patient tolerated well, no complications were noted  Performed by: KoLevi AlandCMA  Follow up/ Additional notes: Follow up as scheduled.

## 2021-11-01 ENCOUNTER — Ambulatory Visit (INDEPENDENT_AMBULATORY_CARE_PROVIDER_SITE_OTHER): Payer: 59 | Admitting: Urology

## 2021-11-01 ENCOUNTER — Encounter: Payer: Self-pay | Admitting: Urology

## 2021-11-01 VITALS — BP 113/74 | HR 61

## 2021-11-01 DIAGNOSIS — C672 Malignant neoplasm of lateral wall of bladder: Secondary | ICD-10-CM | POA: Diagnosis not present

## 2021-11-01 LAB — URINALYSIS, ROUTINE W REFLEX MICROSCOPIC
Bilirubin, UA: NEGATIVE
Glucose, UA: NEGATIVE
Ketones, UA: NEGATIVE
Nitrite, UA: NEGATIVE
Protein,UA: NEGATIVE
RBC, UA: NEGATIVE
Specific Gravity, UA: 1.01 (ref 1.005–1.030)
Urobilinogen, Ur: 0.2 mg/dL (ref 0.2–1.0)
pH, UA: 5.5 (ref 5.0–7.5)

## 2021-11-01 MED ORDER — BCG LIVE 50 MG IS SUSR
3.2400 mL | Freq: Once | INTRAVESICAL | Status: AC
  Administered 2021-11-01: 81 mg via INTRAVESICAL

## 2021-11-01 NOTE — Progress Notes (Signed)
BCG Bladder Instillation  BCG # 6 of 6  Due to Bladder Cancer patient is present today for a BCG treatment. Patient was cleaned and prepped in a sterile fashion with betadine. A 14 FR catheter was inserted, urine return was noted 200 ml, urine was yellow in color.  69m of reconstituted BCG was instilled into the bladder. The catheter was then removed. Patient tolerated well, no complications were noted  Performed by: SMarisue Brooklyn CMA, AStanfield LPN  Patient will resumed  prophylactic antibiotic macrodantin   Follow up/ Additional notes: Followed up with cysto in 6 weeks

## 2021-11-02 ENCOUNTER — Ambulatory Visit: Payer: 59 | Admitting: Physician Assistant

## 2021-12-21 ENCOUNTER — Ambulatory Visit: Payer: 59 | Admitting: Urology

## 2021-12-21 ENCOUNTER — Encounter: Payer: Self-pay | Admitting: Urology

## 2021-12-21 VITALS — BP 118/74 | HR 60

## 2021-12-21 DIAGNOSIS — Z8551 Personal history of malignant neoplasm of bladder: Secondary | ICD-10-CM

## 2021-12-21 DIAGNOSIS — C672 Malignant neoplasm of lateral wall of bladder: Secondary | ICD-10-CM

## 2021-12-21 LAB — URINALYSIS, ROUTINE W REFLEX MICROSCOPIC
Bilirubin, UA: NEGATIVE
Glucose, UA: NEGATIVE
Ketones, UA: NEGATIVE
Leukocytes,UA: NEGATIVE
Nitrite, UA: NEGATIVE
Protein,UA: NEGATIVE
RBC, UA: NEGATIVE
Specific Gravity, UA: 1.015 (ref 1.005–1.030)
Urobilinogen, Ur: 0.2 mg/dL (ref 0.2–1.0)
pH, UA: 7.5 (ref 5.0–7.5)

## 2021-12-21 MED ORDER — CIPROFLOXACIN HCL 500 MG PO TABS
500.0000 mg | ORAL_TABLET | Freq: Once | ORAL | Status: AC
  Administered 2021-12-21: 500 mg via ORAL

## 2021-12-21 NOTE — Patient Instructions (Signed)

## 2021-12-21 NOTE — Progress Notes (Signed)
   12/21/21  CC: followup bladder cancer   HPI: Julie Villa is a 61yo here for followup for recurrent low grade bladder cancer. She finished 6 weeks of BCG Blood pressure 118/74, pulse 60, last menstrual period 04/06/2011. NED. A&Ox3.   No respiratory distress   Abd soft, NT, ND Normal external genitalia with patent urethral meatus  Cystoscopy Procedure Note  Patient identification was confirmed, informed consent was obtained, and patient was prepped using Betadine solution.  Lidocaine jelly was administered per urethral meatus.    Procedure: - Flexible cystoscope introduced, without any difficulty.   - Thorough search of the bladder revealed:    normal urethral meatus    normal urothelium    no stones    no ulcers     no tumors    no urethral polyps    no trabeculation  - Ureteral orifices were normal in position and appearance.  Post-Procedure: - Patient tolerated the procedure well  Assessment/ Plan: RTC 6 months for cystoscopy   No follow-ups on file.  Nicolette Bang, MD

## 2022-01-28 IMAGING — CT CT ABD-PEL WO/W CM
4 of 12 series · 12 of 46 positions shown, 18 images · IV contrast (Omnipaque or Isovue)
Comparison: None.

CLINICAL DATA: Hematuria

EXAM:
CT ABDOMEN AND PELVIS WITHOUT AND WITH CONTRAST
TECHNIQUE: Multidetector CT imaging of the abdomen and pelvis was performed
following the standard protocol before and following the bolus
administration of intravenous contrast.
CONTRAST:  100mL OMNIPAQUE IOHEXOL 350 MG/ML SOLN

[Series 3: axial pre · axial · non-contrast · 0.83mm/px · z∈[+705,+975]mm · 4 of 92 slices shown]
[im 19/92  soft-tissue]
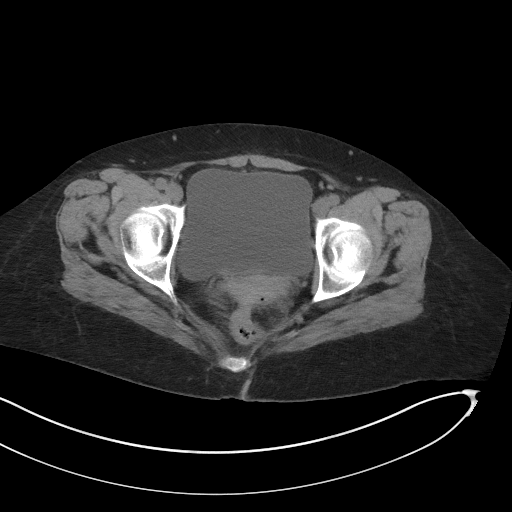
[im 37/92  soft-tissue]
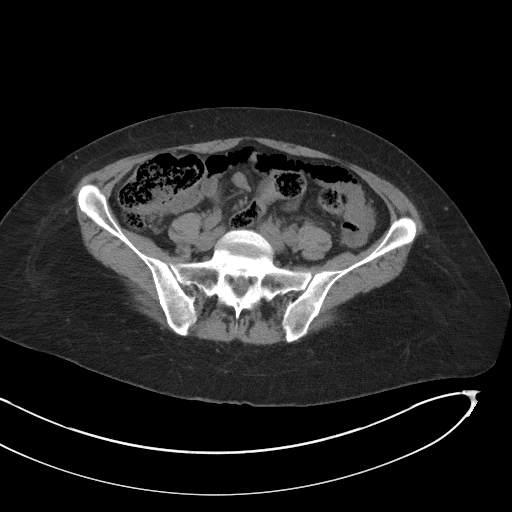
[im 55/92  soft-tissue]
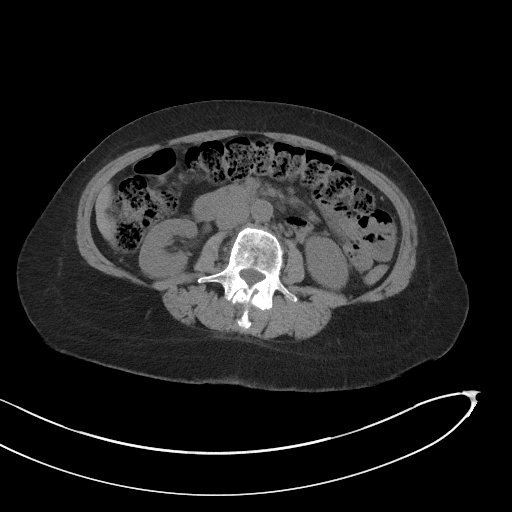
[im 73/92  soft-tissue]
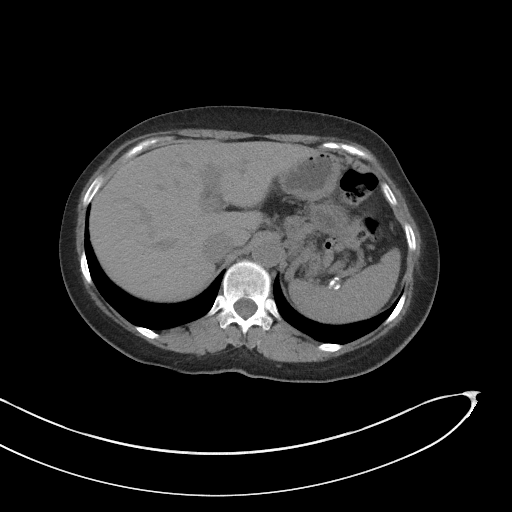

[Series 6: coronal pre · coronal · non-contrast · 0.81mm/px · 2 of 93 slices shown, 3 images]
[im 31/93  soft-tissue]
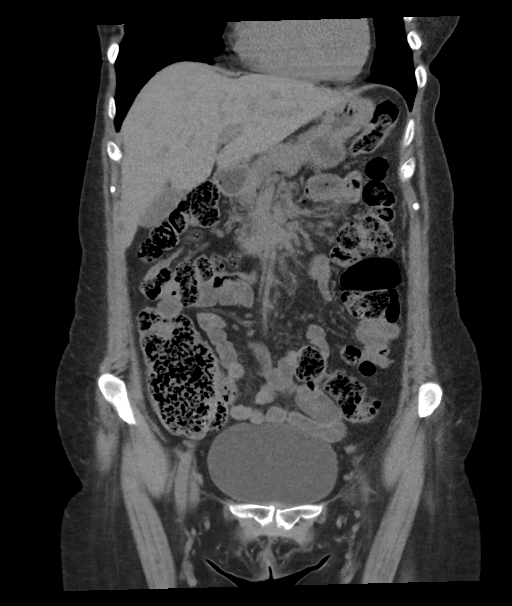
[im 31/93  bone]
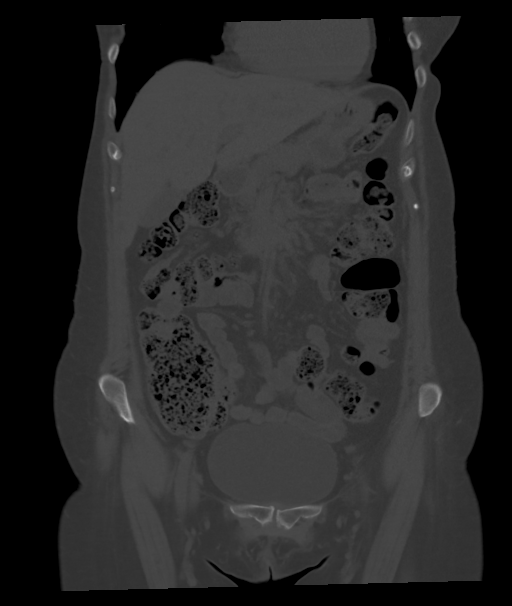
[im 62/93  soft-tissue]
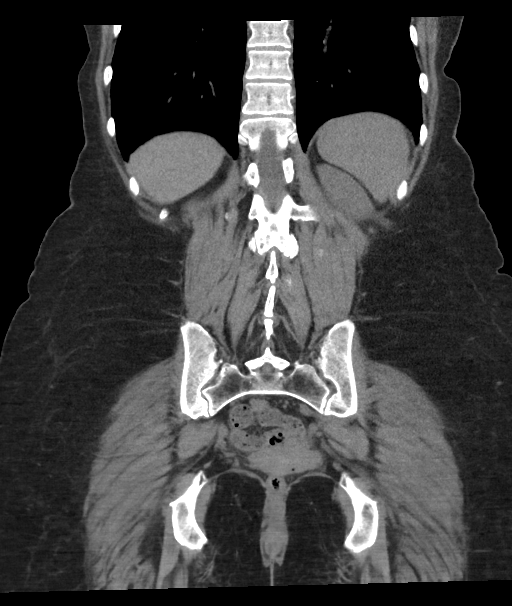

[Series 8: axial post · axial · 0.83mm/px · z∈[+725,+840]mm · 2 of 92 slices shown]
[im 23/92  soft-tissue]
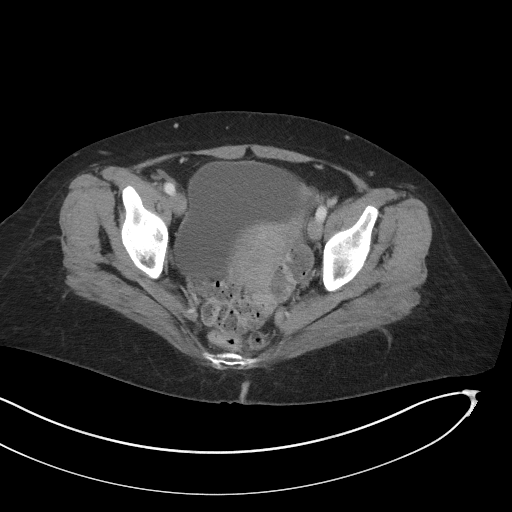
[im 46/92  soft-tissue]
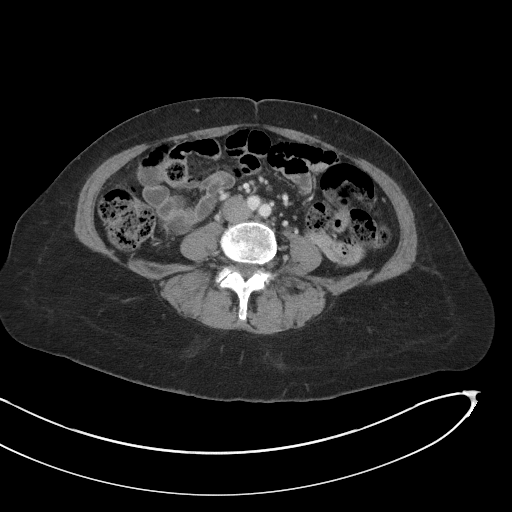

[Series 13: axial delay · axial · delayed · 0.79mm/px · z∈[+695,+980]mm · 4 of 96 slices shown, 9 images]
[im 20/96  soft-tissue]
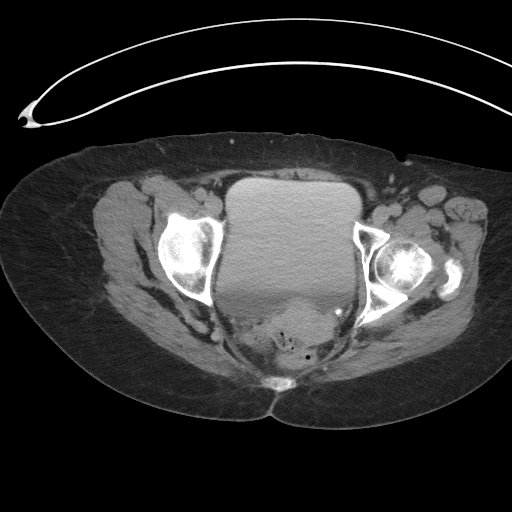
[im 20/96  lung]
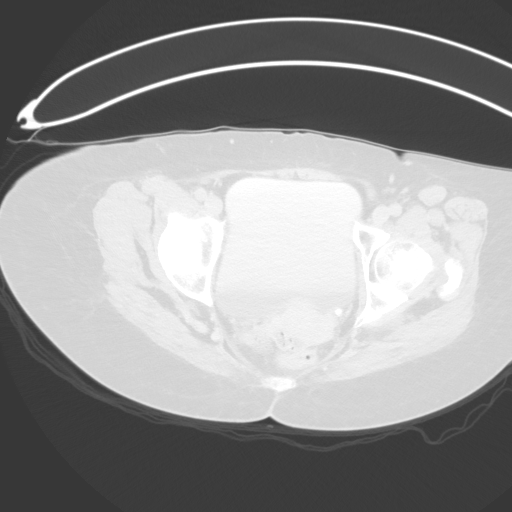
[im 20/96  bone]
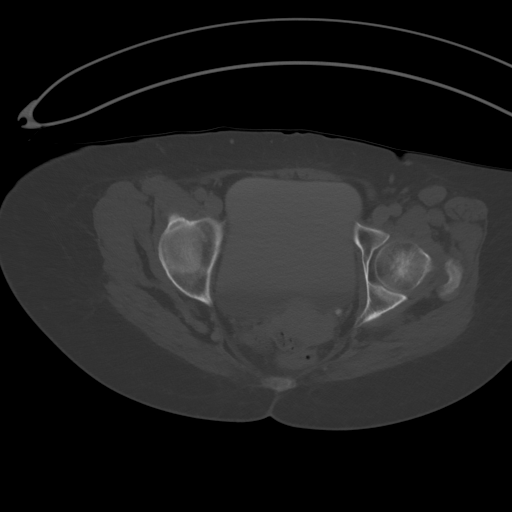
[im 39/96  soft-tissue]
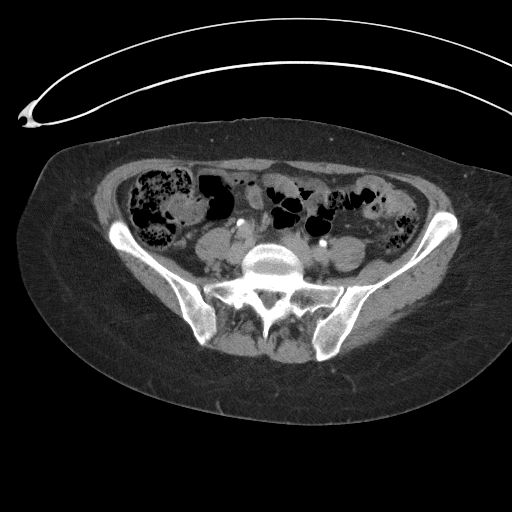
[im 39/96  lung]
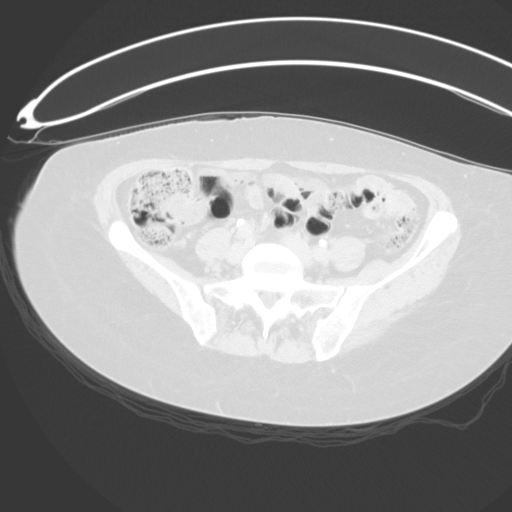
[im 58/96  soft-tissue]
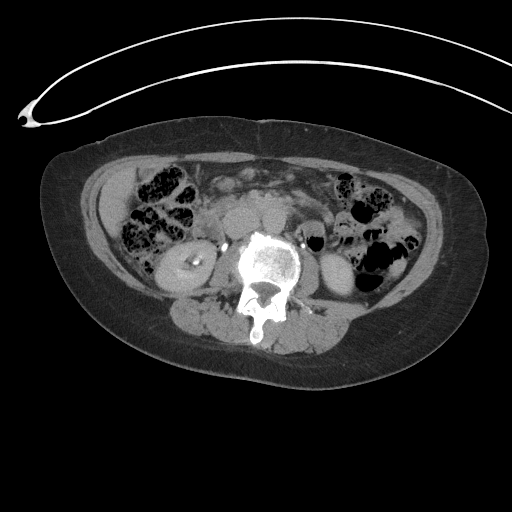
[im 58/96  lung]
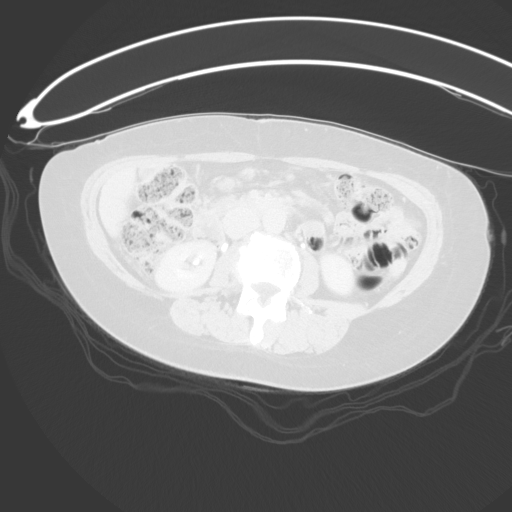
[im 77/96  soft-tissue]
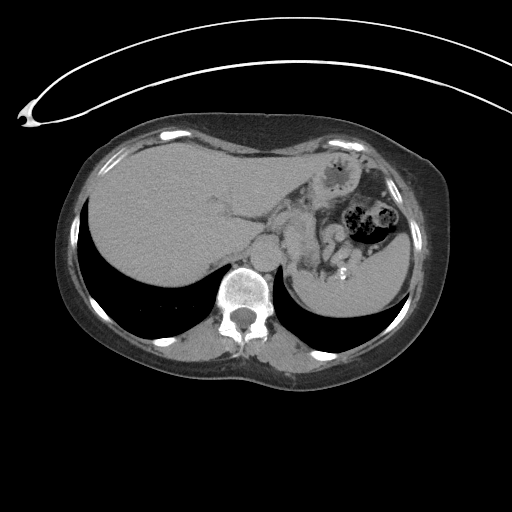
[im 77/96  lung]
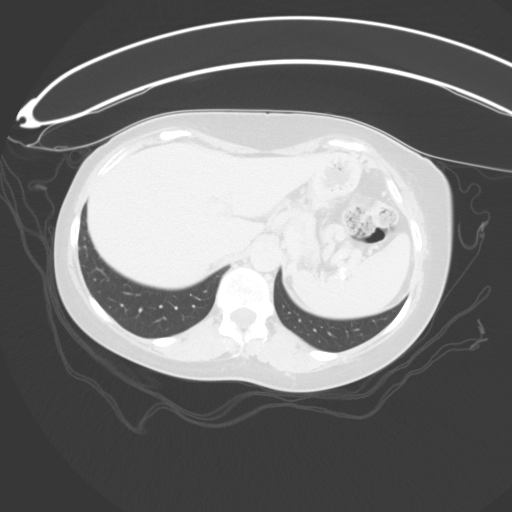

[12 of 46 positions shown; findings below may reference images not displayed]

FINDINGS: Lower chest: No acute abnormality.

Hepatobiliary: No suspicious liver lesions. No gallstones,
gallbladder wall thickening, or biliary dilatation.

Pancreas: Unremarkable. No pancreatic ductal dilatation or
surrounding inflammatory changes.

Spleen: Normal in size without focal abnormality.

Adrenals/Urinary Tract: Bilateral adrenal glands are unremarkable.
No hydronephrosis. No nephrolithiasis. Delayed imaging demonstrates
no suspicious filling defects of the renal collecting systems,
although the distal right ureter is incompletely opacified. Bladder
is unremarkable.

Stomach/Bowel: Stomach is within normal limits. Appendix is
surgically absent. No evidence of bowel wall thickening, distention,
or inflammatory changes.

Vascular/Lymphatic: Aortic atherosclerosis. No enlarged abdominal or
pelvic lymph nodes.

Reproductive: Uterus and bilateral adnexa are unremarkable.

Other: No abdominal wall hernia or abnormality. No abdominopelvic
ascites.

Musculoskeletal: No acute or significant osseous findings.
IMPRESSION: No nephroureterolithiasis or suspicious filling defects of the
opacified renal collecting systems.

Aortic Atherosclerosis ([id number]).

## 2022-02-23 ENCOUNTER — Telehealth: Payer: Self-pay | Admitting: Adult Health

## 2022-02-23 NOTE — Telephone Encounter (Signed)
Patient called to r/s. Patient notified

## 2022-03-07 ENCOUNTER — Encounter: Payer: 59 | Admitting: Adult Health

## 2022-03-09 ENCOUNTER — Inpatient Hospital Stay: Payer: 59

## 2022-03-09 ENCOUNTER — Encounter: Payer: Self-pay | Admitting: Adult Health

## 2022-03-09 ENCOUNTER — Inpatient Hospital Stay: Payer: 59 | Attending: Adult Health | Admitting: Adult Health

## 2022-03-09 ENCOUNTER — Other Ambulatory Visit: Payer: Self-pay

## 2022-03-09 VITALS — BP 131/72 | HR 69 | Temp 97.7°F | Resp 18 | Ht 65.0 in | Wt 158.0 lb

## 2022-03-09 DIAGNOSIS — Z833 Family history of diabetes mellitus: Secondary | ICD-10-CM | POA: Insufficient documentation

## 2022-03-09 DIAGNOSIS — Z803 Family history of malignant neoplasm of breast: Secondary | ICD-10-CM | POA: Insufficient documentation

## 2022-03-09 DIAGNOSIS — Z79811 Long term (current) use of aromatase inhibitors: Secondary | ICD-10-CM | POA: Insufficient documentation

## 2022-03-09 DIAGNOSIS — Z880 Allergy status to penicillin: Secondary | ICD-10-CM | POA: Insufficient documentation

## 2022-03-09 DIAGNOSIS — Z23 Encounter for immunization: Secondary | ICD-10-CM | POA: Diagnosis not present

## 2022-03-09 DIAGNOSIS — Z8249 Family history of ischemic heart disease and other diseases of the circulatory system: Secondary | ICD-10-CM | POA: Diagnosis not present

## 2022-03-09 DIAGNOSIS — Z923 Personal history of irradiation: Secondary | ICD-10-CM | POA: Insufficient documentation

## 2022-03-09 DIAGNOSIS — Z8349 Family history of other endocrine, nutritional and metabolic diseases: Secondary | ICD-10-CM | POA: Diagnosis not present

## 2022-03-09 DIAGNOSIS — C672 Malignant neoplasm of lateral wall of bladder: Secondary | ICD-10-CM | POA: Insufficient documentation

## 2022-03-09 DIAGNOSIS — M85852 Other specified disorders of bone density and structure, left thigh: Secondary | ICD-10-CM | POA: Diagnosis not present

## 2022-03-09 DIAGNOSIS — Z87891 Personal history of nicotine dependence: Secondary | ICD-10-CM | POA: Diagnosis not present

## 2022-03-09 DIAGNOSIS — Z9049 Acquired absence of other specified parts of digestive tract: Secondary | ICD-10-CM | POA: Insufficient documentation

## 2022-03-09 DIAGNOSIS — Z853 Personal history of malignant neoplasm of breast: Secondary | ICD-10-CM | POA: Diagnosis present

## 2022-03-09 MED ORDER — INFLUENZA VAC SPLIT QUAD 0.5 ML IM SUSY
0.5000 mL | PREFILLED_SYRINGE | Freq: Once | INTRAMUSCULAR | Status: DC
  Filled 2022-03-09: qty 0.5

## 2022-03-09 MED ORDER — INFLUENZA VAC SPLIT QUAD 0.5 ML IM SUSY: 0.5000 mL | PREFILLED_SYRINGE | Freq: Once | INTRAMUSCULAR | Status: DC

## 2022-03-09 MED ORDER — INFLUENZA VAC SPLIT QUAD 0.5 ML IM SUSY
0.5000 mL | PREFILLED_SYRINGE | Freq: Once | INTRAMUSCULAR | Status: AC
  Administered 2022-03-09: 0.5 mL via INTRAMUSCULAR

## 2022-03-09 NOTE — Progress Notes (Signed)
[facility] Follow up:    Julie Melter, MD [address] [place] [address]   DIAGNOSIS: History of left breast cancer  SUMMARY OF ONCOLOGIC HISTORY: Oncology History  Carcinoma of upper-inner quadrant of left female breast ([place]) (Resolved)  09/21/2010 Surgery   Left breast lumpectomy invasive low grade ductal carcinoma 0.6 cm with low-grade DCIS invasive lobular cancer spent 0.3 cm grade 1; 2 sentinel nodes negative   11/09/2010 - 01/08/2011 Radiation Therapy   Adjuvant radiation (dates were estimated by the patient)   02/01/2011 - 01/2016 Anti-estrogen oral therapy   Tamoxifen 20 mg by mouth daily switched to anastrozole 1 mg daily February 2016 (after she became postmenopausal)    Miscellaneous   BCI testing sent and showed low likelihood of benefit of extending anti-estrogen therapy past 5 years     CURRENT THERAPY: Observation  INTERVAL HISTORY: Julie Villa 61 y.o. female returns for follow-up of her history of breast cancer.  Her most recent mammogram occurred on July 07, 2021 and showed no mammographic evidence of malignancy and breast density category B.  She is up to date with her health maintenance.  She is walking for exercise.    She still sees Dr. Noah Delaine in urology and she is on medication and has f/u with him in January, 2024.   Patient Active Problem List   Diagnosis Date Noted   Iron deficiency anemia 02/28/2021   Atrophic vaginitis 02/28/2021   Osteopenia of neck of left femur 02/27/2020   Cancer of lateral wall of urinary bladder ([place]) 02/12/2020   Acute cystitis without hematuria 02/12/2020   Scoliosis deformity of spine 08/31/2017   Degeneration of lumbar intervertebral disc 07/31/2017   Hx Breast cancer, Left, UIQ, IDC and ILC with LCIS, Stage I 09/30/2010    is allergic to penicillins and sulfa antibiotics.  MEDICAL HISTORY: Past Medical History:  Diagnosis Date   Anemia    hx of several yrs ago    Bladder tumor    Breast cancer ([place]) 2012   left   Frequency of urination    Hematuria    History of left breast cancer dx 2012  --- oncologist-  dr Lindi Adie ([facility])-- per lov note, no recurrence   Left UIQ - Invasive ductial carcinoma, Invasive Lobular carcinoma w/ LCIS; Stage IA (T1b, N0, M0)--- s/p breast lumpectomy w/ snl dissection and radiation therapy completed 01-03-2011   History of radiation therapy completed 01-03-2011   total 61Gy  , left breast    History of radiation therapy 2012   Personal history of radiation therapy    Wears glasses     SURGICAL HISTORY: Past Surgical History:  Procedure Laterality Date   APPENDECTOMY  1987   BREAST LUMPECTOMY Left 2012   BREAST LUMPECTOMY WITH AXILLARY LYMPH NODE DISSECTION Left 09-21-2010  dr Margot Chimes  [facility]   CATARACT EXTRACTION W/ INTRAOCULAR LENS IMPLANT Right 06/2016   CYSTOSCOPY N/A 02/05/2020   Procedure: CYSTOSCOPY;  Surgeon: Cleon Gustin, MD;  Location: [facility];  Service: Urology;  Laterality: N/A;   CYSTOSCOPY W/ RETROGRADES Bilateral 01/19/2017   Procedure: CYSTOSCOPY WITH RETROGRADE PYELOGRAM;  Surgeon: Cleon Gustin, MD;  Location: [facility];  Service: Urology;  Laterality: Bilateral;   CYSTOSCOPY W/ RETROGRADES Bilateral 08/08/2021   Procedure: CYSTOSCOPY WITH RETROGRADE PYELOGRAM;  Surgeon: Cleon Gustin, MD;  Location: [facility];  Service: Urology;  Laterality: Bilateral;   NASAL SINUS SURGERY  06/2008   TRANSURETHRAL  RESECTION OF BLADDER TUMOR N/A 01/19/2017   Procedure: TRANSURETHRAL RESECTION OF BLADDER TUMOR (TURBT);  Surgeon: Cleon Gustin, MD;  Location: [facility];  Service: Urology;  Laterality: N/A;   TRANSURETHRAL RESECTION OF BLADDER TUMOR N/A 11/29/2017   Procedure: TRANSURETHRAL RESECTION OF BLADDER TUMOR (TURBT);  Surgeon: Cleon Gustin, MD;  Location: [facility];  Service: Urology;  Laterality: N/A;   TRANSURETHRAL  RESECTION OF BLADDER TUMOR N/A 02/05/2020   Procedure: TRANSURETHRAL RESECTION OF BLADDER TUMOR (TURBT);  Surgeon: Cleon Gustin, MD;  Location: [facility];  Service: Urology;  Laterality: N/A;   TRANSURETHRAL RESECTION OF BLADDER TUMOR N/A 08/08/2021   Procedure: TRANSURETHRAL RESECTION OF BLADDER TUMOR (TURBT);  Surgeon: Cleon Gustin, MD;  Location: [facility];  Service: Urology;  Laterality: N/A;    SOCIAL HISTORY: Social History   Socioeconomic History   Marital status: Married    Spouse name: Not on file   Number of children: Not on file   Years of education: Not on file   Highest education level: Not on file  Occupational History   Not on file  Tobacco Use   Smoking status: Former    Years: 3.00    Types: Cigarettes    Quit date: 01/17/1982    Years since quitting: 40.1   Smokeless tobacco: Never  Vaping Use   Vaping Use: Never used  Substance and Sexual Activity   Alcohol use: Yes    Alcohol/week: 1.0 standard drink of alcohol    Types: 1 Glasses of wine per week    Comment: glass wine couple times a month   Drug use: No   Sexual activity: Yes  Other Topics Concern   Not on file  Social History Narrative   Not on file   Social Determinants of Health   Financial Resource Strain: Not on file  Food Insecurity: Not on file  Transportation Needs: Not on file  Physical Activity: Not on file  Stress: Not on file  Social Connections: Not on file  Intimate Partner Violence: Not on file    FAMILY HISTORY: Family History  Problem Relation Age of Onset   Breast cancer Mother    Cancer Mother        breast   Hypertension Mother    Hyperlipidemia Mother    Diabetes Father    Heart disease Father    Hyperlipidemia Father    Hypertension Father    Diabetes Sister    Hypertension Sister    Hyperlipidemia Sister     Review of Systems  Constitutional:  Negative for appetite change, chills, fatigue, fever and unexpected weight change.  HENT:   Negative for  hearing loss, lump/mass and trouble swallowing.   Eyes:  Negative for eye problems and icterus.  Respiratory:  Negative for chest tightness, cough and shortness of breath.   Cardiovascular:  Negative for chest pain, leg swelling and palpitations.  Gastrointestinal:  Negative for abdominal distention, abdominal pain, constipation, diarrhea, nausea and vomiting.  Endocrine: Negative for hot flashes.  Genitourinary:  Negative for difficulty urinating.   Musculoskeletal:  Negative for arthralgias.  Skin:  Negative for itching and rash.  Neurological:  Negative for dizziness, extremity weakness, headaches and numbness.  Hematological:  Negative for adenopathy. Does not bruise/bleed easily.  Psychiatric/Behavioral:  Negative for depression. The patient is not nervous/anxious.       PHYSICAL EXAMINATION  ECOG PERFORMANCE STATUS: 0 - Asymptomatic  Vitals:   03/09/22 0944  BP: 131/72  Pulse:  69  Resp: 18  Temp: 97.7 F (36.5 C)  SpO2: 100%    Physical Exam Constitutional:      General: She is not in acute distress.    Appearance: Normal appearance. She is not toxic-appearing.  HENT:     Head: Normocephalic and atraumatic.  Eyes:     General: No scleral icterus. Cardiovascular:     Rate and Rhythm: Normal rate and regular rhythm.     Pulses: Normal pulses.     Heart sounds: Normal heart sounds.  Pulmonary:     Effort: Pulmonary effort is normal.     Breath sounds: Normal breath sounds.  Chest:     Comments: Left breast status postlumpectomy and radiation no sign of local recurrence right breast is benign. Abdominal:     General: Abdomen is flat. Bowel sounds are normal. There is no distension.     Palpations: Abdomen is soft.     Tenderness: There is no abdominal tenderness.  Musculoskeletal:        General: No swelling.     Cervical back: Neck supple.  Lymphadenopathy:     Cervical: No cervical adenopathy.  Skin:    General: Skin is warm and dry.     Findings: No rash.   Neurological:     General: No focal deficit present.     Mental Status: She is alert.  Psychiatric:        Mood and Affect: Mood normal.        Behavior: Behavior normal.     LABORATORY DATA: None for this visit    ASSESSMENT and THERAPY PLAN:   Hx Breast cancer, Left, UIQ, IDC and ILC with LCIS, Stage I Julie Villa is a 61 year old woman with history of left sided stage Ia invasive breast cancer diagnosed in 2012 that was estrogen positive she is status post lumpectomy, adjuvant radiation, and 5 years of antiestrogen therapy with tamoxifen.    Julie Villa is doing well.  She has no clinical or radiographic sign of breast cancer recurrence.  Her most recent mammogram in March was normal and I recommended repeat in March 2024.  We discussed healthy diet and exercise.  And reviewed calcium, vitamin D, and weightbearing exercises for her osteopenia.  She will be due for repeat bone density testing in May 2024.  She will continue to see primary care regularly and we are reaching out to them to get her most recent Pap smear results to enter into our computer system.  We will see her back in 1 year or sooner if needed for any questions or concerns.  All questions were answered. The patient knows to call the clinic with any problems, questions or concerns. We can certainly see the patient much sooner if necessary.  Total encounter time:20 minutes*in face-to-face visit time, chart review, lab review, care coordination, order entry, and documentation of the encounter time.    Wilber Bihari, NP 03/09/22 10:39 AM Medical Oncology and Hematology [facility] [place], [place] [address] Tel. [phone]    Fax. [phone]  *Total Encounter Time as defined by the Centers for Medicare and Medicaid Services includes, in addition to the face-to-face time of a patient visit (documented in the note above) non-face-to-face time: obtaining and reviewing outside history, ordering  and reviewing medications, tests or procedures, care coordination (communications with other health care professionals or caregivers) and documentation in the medical record.

## 2022-03-09 NOTE — Assessment & Plan Note (Signed)
Julie Villa is a 61 year old woman with history of left sided stage Ia invasive breast cancer diagnosed in 2012 that was estrogen positive she is status post lumpectomy, adjuvant radiation, and 5 years of antiestrogen therapy with tamoxifen.    [name] is doing well.  She has no clinical or radiographic sign of breast cancer recurrence.  Her most recent mammogram in March was normal and I recommended repeat in March 2024.  We discussed healthy diet and exercise.  And reviewed calcium, vitamin D, and weightbearing exercises for her osteopenia.  She will be due for repeat bone density testing in May 2024.  She will continue to see primary care regularly and we are reaching out to them to get her most recent Pap smear results to enter into our computer system.  We will see her back in 1 year or sooner if needed for any questions or concerns.

## 2022-04-01 ENCOUNTER — Other Ambulatory Visit: Payer: Self-pay | Admitting: Urology

## 2022-04-01 DIAGNOSIS — N3021 Other chronic cystitis with hematuria: Secondary | ICD-10-CM

## 2022-04-26 ENCOUNTER — Ambulatory Visit: Payer: 59 | Admitting: Urology

## 2022-06-19 ENCOUNTER — Ambulatory Visit (INDEPENDENT_AMBULATORY_CARE_PROVIDER_SITE_OTHER): Payer: 59 | Admitting: Urology

## 2022-06-19 ENCOUNTER — Encounter: Payer: Self-pay | Admitting: Urology

## 2022-06-19 VITALS — BP 116/72 | HR 65

## 2022-06-19 DIAGNOSIS — C672 Malignant neoplasm of lateral wall of bladder: Secondary | ICD-10-CM

## 2022-06-19 DIAGNOSIS — Z8551 Personal history of malignant neoplasm of bladder: Secondary | ICD-10-CM | POA: Diagnosis not present

## 2022-06-19 LAB — URINALYSIS, ROUTINE W REFLEX MICROSCOPIC
Bilirubin, UA: NEGATIVE
Glucose, UA: NEGATIVE
Ketones, UA: NEGATIVE
Leukocytes,UA: NEGATIVE
Nitrite, UA: NEGATIVE
Protein,UA: NEGATIVE
RBC, UA: NEGATIVE
Specific Gravity, UA: 1.01 (ref 1.005–1.030)
Urobilinogen, Ur: 0.2 mg/dL (ref 0.2–1.0)
pH, UA: 7 (ref 5.0–7.5)

## 2022-06-19 MED ORDER — CIPROFLOXACIN HCL 500 MG PO TABS
500.0000 mg | ORAL_TABLET | Freq: Once | ORAL | Status: AC
  Administered 2022-06-19: 500 mg via ORAL

## 2022-06-19 NOTE — Patient Instructions (Signed)

## 2022-06-19 NOTE — Progress Notes (Signed)
   06/19/22  CC: followup bladder cancer   HPI: Julie Villa is a 62yo here for followup for bladder cancer Blood pressure 116/72, pulse 65, last menstrual period 04/06/2011. NED. A&Ox3.   No respiratory distress   Abd soft, NT, ND Normal external genitalia with patent urethral meatus  Cystoscopy Procedure Note  Patient identification was confirmed, informed consent was obtained, and patient was prepped using Betadine solution.  Lidocaine jelly was administered per urethral meatus.    Procedure: - Flexible cystoscope introduced, without any difficulty.   - Thorough search of the bladder revealed:    normal urethral meatus    normal urothelium    no stones    no ulcers     no tumors    no urethral polyps    no trabeculation  - Ureteral orifices were normal in position and appearance.  Post-Procedure: - Patient tolerated the procedure well  Assessment/ Plan:  Followup 6 months with cystoscopy  No follow-ups on file.  Nicolette Bang, MD

## 2022-06-23 ENCOUNTER — Other Ambulatory Visit: Payer: Self-pay | Admitting: Family Medicine

## 2022-06-23 DIAGNOSIS — Z1231 Encounter for screening mammogram for malignant neoplasm of breast: Secondary | ICD-10-CM

## 2022-09-05 ENCOUNTER — Ambulatory Visit
Admission: RE | Admit: 2022-09-05 | Discharge: 2022-09-05 | Disposition: A | Discharge status: Home / Self Care | Payer: 59 | Source: Ambulatory Visit | Attending: Adult Health | Admitting: Adult Health

## 2022-09-05 ENCOUNTER — Ambulatory Visit
Admission: RE | Admit: 2022-09-05 | Discharge: 2022-09-05 | Disposition: A | Discharge status: Home / Self Care | Payer: 59 | Source: Ambulatory Visit | Attending: Family Medicine | Admitting: Family Medicine

## 2022-09-05 DIAGNOSIS — Z1231 Encounter for screening mammogram for malignant neoplasm of breast: Secondary | ICD-10-CM

## 2022-09-05 DIAGNOSIS — M85852 Other specified disorders of bone density and structure, left thigh: Secondary | ICD-10-CM

## 2022-09-26 ENCOUNTER — Other Ambulatory Visit: Payer: 59 | Admitting: Urology

## 2022-12-13 ENCOUNTER — Encounter: Payer: Self-pay | Admitting: Urology

## 2022-12-13 ENCOUNTER — Ambulatory Visit (INDEPENDENT_AMBULATORY_CARE_PROVIDER_SITE_OTHER): Payer: 59 | Admitting: Urology

## 2022-12-13 VITALS — BP 115/75 | HR 56

## 2022-12-13 DIAGNOSIS — Z8551 Personal history of malignant neoplasm of bladder: Secondary | ICD-10-CM | POA: Diagnosis not present

## 2022-12-13 DIAGNOSIS — C672 Malignant neoplasm of lateral wall of bladder: Secondary | ICD-10-CM

## 2022-12-13 LAB — URINALYSIS, ROUTINE W REFLEX MICROSCOPIC
Bilirubin, UA: NEGATIVE
Glucose, UA: NEGATIVE
Ketones, UA: NEGATIVE
Leukocytes,UA: NEGATIVE
Nitrite, UA: NEGATIVE
Protein,UA: NEGATIVE
RBC, UA: NEGATIVE
Specific Gravity, UA: 1.01 (ref 1.005–1.030)
Urobilinogen, Ur: 0.2 mg/dL (ref 0.2–1.0)
pH, UA: 7 (ref 5.0–7.5)

## 2022-12-13 MED ORDER — CIPROFLOXACIN HCL 500 MG PO TABS
500.0000 mg | ORAL_TABLET | Freq: Once | ORAL | Status: AC
Start: 2022-12-13 — End: 2022-12-13
  Administered 2022-12-13: 500 mg via ORAL

## 2022-12-13 NOTE — Patient Instructions (Signed)

## 2022-12-13 NOTE — Progress Notes (Signed)
   12/13/22  CC: followup bladder cancer   HPI: Ms Hirakawa is a 62yo here for followup for bladder cancer Blood pressure 115/75, pulse (!) 56, last menstrual period 04/06/2011. NED. A&Ox3.   No respiratory distress   Abd soft, NT, ND Normal external genitalia with patent urethral meatus  Cystoscopy Procedure Note  Patient identification was confirmed, informed consent was obtained, and patient was prepped using Betadine solution.  Lidocaine jelly was administered per urethral meatus.    Procedure: - Flexible cystoscope introduced, without any difficulty.   - Thorough search of the bladder revealed:    normal urethral meatus    normal urothelium    no stones    no ulcers     no tumors    no urethral polyps    no trabeculation  - Ureteral orifices were normal in position and appearance.  Post-Procedure: - Patient tolerated the procedure well  Assessment/ Plan: Followup 6 months with cystoscopy   No follow-ups on file.  Wilkie Aye, MD

## 2023-01-10 ENCOUNTER — Telehealth: Payer: Self-pay | Admitting: Adult Health

## 2023-01-10 NOTE — Telephone Encounter (Signed)
Left patient a message regarding rescheduled appointment times/dates due to provider being out of office 03/12/2023

## 2023-02-08 ENCOUNTER — Telehealth: Payer: Self-pay | Admitting: Urology

## 2023-02-08 NOTE — Telephone Encounter (Signed)
Called patient and offered a lab appointment for urine drop due to provider not being in office. Patient stated that she went to urgent care and is being treated. Patient informed that if symptoms does not improve after completing antibiotics to contact the office. Patient voiced understanding.

## 2023-02-08 NOTE — Telephone Encounter (Signed)
Patient called she said that she took a home test and it is showing positive for UTI , she is going to bathroom every hour.  She has a hx of frequent UTI and it is becoming painful for her to use the restroom   Offered first available appt with Maralyn Sago for Nov 8,2024 patient refused.

## 2023-03-12 ENCOUNTER — Encounter: Payer: 59 | Admitting: Adult Health

## 2023-03-14 ENCOUNTER — Inpatient Hospital Stay: Payer: 59 | Attending: Adult Health | Admitting: Adult Health

## 2023-03-14 ENCOUNTER — Encounter: Payer: Self-pay | Admitting: Adult Health

## 2023-03-14 VITALS — BP 123/62 | HR 58 | Temp 97.6°F | Resp 16 | Wt 159.2 lb

## 2023-03-14 DIAGNOSIS — Z83438 Family history of other disorder of lipoprotein metabolism and other lipidemia: Secondary | ICD-10-CM | POA: Insufficient documentation

## 2023-03-14 DIAGNOSIS — Z9049 Acquired absence of other specified parts of digestive tract: Secondary | ICD-10-CM | POA: Diagnosis not present

## 2023-03-14 DIAGNOSIS — Z87891 Personal history of nicotine dependence: Secondary | ICD-10-CM | POA: Diagnosis not present

## 2023-03-14 DIAGNOSIS — Z853 Personal history of malignant neoplasm of breast: Secondary | ICD-10-CM | POA: Insufficient documentation

## 2023-03-14 DIAGNOSIS — M8580 Other specified disorders of bone density and structure, unspecified site: Secondary | ICD-10-CM | POA: Diagnosis not present

## 2023-03-14 DIAGNOSIS — Z833 Family history of diabetes mellitus: Secondary | ICD-10-CM | POA: Diagnosis not present

## 2023-03-14 DIAGNOSIS — Z8249 Family history of ischemic heart disease and other diseases of the circulatory system: Secondary | ICD-10-CM | POA: Insufficient documentation

## 2023-03-14 DIAGNOSIS — Z803 Family history of malignant neoplasm of breast: Secondary | ICD-10-CM | POA: Diagnosis not present

## 2023-03-14 DIAGNOSIS — Z8349 Family history of other endocrine, nutritional and metabolic diseases: Secondary | ICD-10-CM | POA: Insufficient documentation

## 2023-03-14 DIAGNOSIS — Z79811 Long term (current) use of aromatase inhibitors: Secondary | ICD-10-CM | POA: Diagnosis not present

## 2023-03-14 DIAGNOSIS — Z79899 Other long term (current) drug therapy: Secondary | ICD-10-CM | POA: Diagnosis not present

## 2023-03-14 NOTE — Progress Notes (Signed)
[facility] Cancer Follow up:    Julie Rua, MD [address] [place] [address]   DIAGNOSIS:  Cancer Staging  No matching staging information was found for the patient.   SUMMARY OF ONCOLOGIC HISTORY: Oncology History  Carcinoma of upper-inner quadrant of left female breast (HCC) (Resolved)  09/21/2010 Surgery   Left breast lumpectomy invasive low grade ductal carcinoma 0.6 cm with low-grade DCIS invasive lobular cancer spent 0.3 cm grade 1; 2 sentinel nodes negative   11/09/2010 - 01/08/2011 Radiation Therapy   Adjuvant radiation (dates were estimated by the patient)   02/01/2011 - 01/2016 Anti-estrogen oral therapy   Tamoxifen 20 mg by mouth daily switched to anastrozole 1 mg daily February 2016 (after she became postmenopausal)    Miscellaneous   BCI testing sent and showed low likelihood of benefit of extending anti-estrogen therapy past 5 years     CURRENT THERAPY: observation  INTERVAL HISTORY:  Discussed the use of AI scribe software for clinical note transcription with the patient, who gave verbal consent to proceed.  Julie Villa 62 y.o. female returns for f/u of her history of breast cancer.   She reports no new health changes or problems since the last visit. The patient denied experiencing any swelling, cough, or shortness of breath. She also denied any abdominal pain during the physical examination.  The patient's mammogram from May showed no evidence of malignancy and she was categorized under breast density category B. Her bone density was reported to be closer to normal with a T score of -1.5, indicating mild osteopenia.  The patient is under the care of a primary care physician and a urologist. She is not currently working but stays active during the day with various activities including dancing and volunteering for [employer]. She reported a regular intake of fruits and vegetables as part of her diet.   Patient Active  Problem List   Diagnosis Date Noted   Iron deficiency anemia 02/28/2021   Atrophic vaginitis 02/28/2021   Osteopenia of neck of left femur 02/27/2020   Cancer of lateral wall of urinary bladder (HCC) 02/12/2020   Acute cystitis without hematuria 02/12/2020   Scoliosis deformity of spine 08/31/2017   Degeneration of lumbar intervertebral disc 07/31/2017   Hx Breast cancer, Left, UIQ, IDC and ILC with LCIS, Stage I 09/30/2010    is allergic to penicillins and sulfa antibiotics.  MEDICAL HISTORY: Past Medical History:  Diagnosis Date   Anemia    hx of several yrs ago   Bladder tumor    Breast cancer (HCC) 2012   left   Frequency of urination    Hematuria    History of left breast cancer dx 2012  --- oncologist-  dr Pamelia Hoit ([facility])-- per lov note, no recurrence   Left UIQ - Invasive ductial carcinoma, Invasive Lobular carcinoma w/ LCIS; Stage IA (T1b, N0, M0)--- s/p breast lumpectomy w/ snl dissection and radiation therapy completed 01-03-2011   History of radiation therapy completed 01-03-2011   total 61Gy  , left breast    History of radiation therapy 2012   Personal history of radiation therapy    Wears glasses     SURGICAL HISTORY: Past Surgical History:  Procedure Laterality Date   APPENDECTOMY  1987   BREAST LUMPECTOMY Left 2012   BREAST LUMPECTOMY WITH AXILLARY LYMPH NODE DISSECTION Left 09-21-2010  dr Jamey Ripa  [facility]   CATARACT EXTRACTION W/ INTRAOCULAR LENS IMPLANT Right 06/2016  CYSTOSCOPY N/A 02/05/2020   Procedure: CYSTOSCOPY;  Surgeon: Malen Gauze, MD;  Location: [facility];  Service: Urology;  Laterality: N/A;   CYSTOSCOPY W/ RETROGRADES Bilateral 01/19/2017   Procedure: CYSTOSCOPY WITH RETROGRADE PYELOGRAM;  Surgeon: Malen Gauze, MD;  Location: [facility];  Service: Urology;  Laterality: Bilateral;   CYSTOSCOPY W/ RETROGRADES Bilateral 08/08/2021   Procedure: CYSTOSCOPY WITH RETROGRADE PYELOGRAM;  Surgeon: Malen Gauze, MD;  Location: [facility];  Service: Urology;  Laterality: Bilateral;   NASAL SINUS SURGERY  06/2008   TRANSURETHRAL RESECTION OF BLADDER TUMOR N/A 01/19/2017   Procedure: TRANSURETHRAL RESECTION OF BLADDER TUMOR (TURBT);  Surgeon: Malen Gauze, MD;  Location: [facility];  Service: Urology;  Laterality: N/A;   TRANSURETHRAL RESECTION OF BLADDER TUMOR N/A 11/29/2017   Procedure: TRANSURETHRAL RESECTION OF BLADDER TUMOR (TURBT);  Surgeon: Malen Gauze, MD;  Location: [facility];  Service: Urology;  Laterality: N/A;   TRANSURETHRAL RESECTION OF BLADDER TUMOR N/A 02/05/2020   Procedure: TRANSURETHRAL RESECTION OF BLADDER TUMOR (TURBT);  Surgeon: Malen Gauze, MD;  Location: [facility];  Service: Urology;  Laterality: N/A;   TRANSURETHRAL RESECTION OF BLADDER TUMOR N/A 08/08/2021   Procedure: TRANSURETHRAL RESECTION OF BLADDER TUMOR (TURBT);  Surgeon: Malen Gauze, MD;  Location: [facility];  Service: Urology;  Laterality: N/A;    SOCIAL HISTORY: Social History   Socioeconomic History   Marital status: Married    Spouse name: Not on file   Number of children: Not on file   Years of education: Not on file   Highest education level: Not on file  Occupational History   Not on file  Tobacco Use   Smoking status: Former    Current packs/day: 0.00    Types: Cigarettes    Start date: 01/18/1979    Quit date: 01/17/1982    Years since quitting: 41.1   Smokeless tobacco: Never  Vaping Use   Vaping status: Never Used  Substance and Sexual Activity   Alcohol use: Yes    Alcohol/week: 1.0 standard drink of alcohol    Types: 1 Glasses of wine per week    Comment: glass wine couple times a month   Drug use: No   Sexual activity: Yes  Other Topics Concern   Not on file  Social History Narrative   Not on file   Social Determinants of Health   Financial Resource Strain: Not on file  Food Insecurity: Not on file  Transportation Needs: Not on  file  Physical Activity: Not on file  Stress: Not on file  Social Connections: Not on file  Intimate Partner Violence: Not on file    FAMILY HISTORY: Family History  Problem Relation Age of Onset   Breast cancer Mother    Cancer Mother        breast   Hypertension Mother    Hyperlipidemia Mother    Diabetes Father    Heart disease Father    Hyperlipidemia Father    Hypertension Father    Diabetes Sister    Hypertension Sister    Hyperlipidemia Sister     Review of Systems  Constitutional:  Negative for appetite change, chills, fatigue, fever and unexpected weight change.  HENT:   Negative for hearing loss, lump/mass and trouble swallowing.   Eyes:  Negative for eye problems and icterus.  Respiratory:  Negative for chest tightness, cough and shortness of breath.   Cardiovascular:  Negative for chest pain, leg swelling  and palpitations.  Gastrointestinal:  Negative for abdominal distention, abdominal pain, constipation, diarrhea, nausea and vomiting.  Endocrine: Negative for hot flashes.  Genitourinary:  Negative for difficulty urinating.   Musculoskeletal:  Negative for arthralgias.  Skin:  Negative for itching and rash.  Neurological:  Negative for dizziness, extremity weakness, headaches and numbness.  Hematological:  Negative for adenopathy. Does not bruise/bleed easily.  Psychiatric/Behavioral:  Negative for depression. The patient is not nervous/anxious.       PHYSICAL EXAMINATION   Onc Performance Status - 03/14/23 1000       ECOG Perf Status   ECOG Perf Status Fully active, able to carry on all pre-disease performance without restriction      KPS SCALE   KPS % SCORE Normal, no compliants, no evidence of disease             Vitals:   03/14/23 0952  BP: 123/62  Pulse: (!) 58  Resp: 16  Temp: 97.6 F (36.4 C)  SpO2: 100%    Physical Exam Constitutional:      General: She is not in acute distress.    Appearance: Normal appearance. She is not  toxic-appearing.  HENT:     Head: Normocephalic and atraumatic.     Mouth/Throat:     Mouth: Mucous membranes are moist.     Pharynx: Oropharynx is clear. No oropharyngeal exudate or posterior oropharyngeal erythema.  Eyes:     General: No scleral icterus. Cardiovascular:     Rate and Rhythm: Normal rate and regular rhythm.     Pulses: Normal pulses.     Heart sounds: Normal heart sounds.  Pulmonary:     Effort: Pulmonary effort is normal.     Breath sounds: Normal breath sounds.  Chest:     Comments: Left breast s/p lumpectomy and radiation, no sign of local recurrence, right breast benign Abdominal:     General: Abdomen is flat. Bowel sounds are normal. There is no distension.     Palpations: Abdomen is soft.     Tenderness: There is no abdominal tenderness.  Musculoskeletal:        General: No swelling.     Cervical back: Neck supple.  Lymphadenopathy:     Cervical: No cervical adenopathy.     Upper Body:     Right upper body: No axillary adenopathy.     Left upper body: No axillary adenopathy.  Skin:    General: Skin is warm and dry.     Findings: No rash.  Neurological:     General: No focal deficit present.     Mental Status: She is alert.  Psychiatric:        Mood and Affect: Mood normal.        Behavior: Behavior normal.        ASSESSMENT and THERAPY PLAN:   Hx Breast cancer, Left, UIQ, IDC and ILC with LCIS, Stage I Julie Villa is a 62 year old woman with history of left sided stage Ia invasive breast cancer diagnosed in 2012 that was estrogen positive she is status post lumpectomy, adjuvant radiation, and 5 years of antiestrogen therapy with tamoxifen.    Breast Cancer Surveillance No evidence of malignancy on mammogram from March. No new breast symptoms or physical exam findings suggestive of recurrence. -Continue annual mammograms and routine follow-ups.  Osteopenia T-score of -1.5, closer to normal than osteoporosis. Patient is active and consumes adequate  calcium and vitamin D. -Continue current lifestyle modifications including weight-bearing exercises, adequate calcium and vitamin D intake.  General Health Maintenance Patient is up-to-date with cancer screenings and primary care visits. Active lifestyle with involvement in dance and volunteer work. -Continue current lifestyle and preventive health measures. -Return for annual follow-up or sooner if any concerns arise.   All questions were answered. The patient knows to call the clinic with any problems, questions or concerns. We can certainly see the patient much sooner if necessary.  Total encounter time:30 minutes*in face-to-face visit time, chart review, lab review, care coordination, order entry, and documentation of the encounter time.    Lillard Anes, NP 03/14/23 2:58 PM Medical Oncology and Hematology [facility] [address] [place], [place] [address] Tel. [phone]    Fax. [phone]  *Total Encounter Time as defined by the Centers for Medicare and Medicaid Services includes, in addition to the face-to-face time of a patient visit (documented in the note above) non-face-to-face time: obtaining and reviewing outside history, ordering and reviewing medications, tests or procedures, care coordination (communications with other health care professionals or caregivers) and documentation in the medical record.

## 2023-03-14 NOTE — Assessment & Plan Note (Addendum)
Julie Villa is a 62 year old woman with history of left sided stage Ia invasive breast cancer diagnosed in 2012 that was estrogen positive she is status post lumpectomy, adjuvant radiation, and 5 years of antiestrogen therapy with tamoxifen.    Breast Cancer Surveillance No evidence of malignancy on mammogram from March. No new breast symptoms or physical exam findings suggestive of recurrence. -Continue annual mammograms and routine follow-ups.  Osteopenia T-score of -1.5, closer to normal than osteoporosis. Patient is active and consumes adequate calcium and vitamin D. -Continue current lifestyle modifications including weight-bearing exercises, adequate calcium and vitamin D intake.  General Health Maintenance Patient is up-to-date with cancer screenings and primary care visits. Active lifestyle with involvement in dance and volunteer work. -Continue current lifestyle and preventive health measures. -Return for annual follow-up or sooner if any concerns arise.

## 2023-04-12 ENCOUNTER — Other Ambulatory Visit: Payer: Self-pay | Admitting: Urology

## 2023-04-12 DIAGNOSIS — N3021 Other chronic cystitis with hematuria: Secondary | ICD-10-CM

## 2023-04-18 ENCOUNTER — Other Ambulatory Visit: Payer: Self-pay

## 2023-04-18 DIAGNOSIS — N3021 Other chronic cystitis with hematuria: Secondary | ICD-10-CM

## 2023-04-18 MED ORDER — NITROFURANTOIN MACROCRYSTAL 50 MG PO CAPS
50.0000 mg | ORAL_CAPSULE | Freq: Every day | ORAL | 11 refills | Status: DC
Start: 2023-04-18 — End: 2023-12-14

## 2023-06-13 ENCOUNTER — Ambulatory Visit: Payer: 59 | Admitting: Urology

## 2023-06-13 VITALS — BP 134/84 | HR 61

## 2023-06-13 DIAGNOSIS — C672 Malignant neoplasm of lateral wall of bladder: Secondary | ICD-10-CM

## 2023-06-13 DIAGNOSIS — N3021 Other chronic cystitis with hematuria: Secondary | ICD-10-CM

## 2023-06-13 DIAGNOSIS — N3281 Overactive bladder: Secondary | ICD-10-CM | POA: Diagnosis not present

## 2023-06-13 DIAGNOSIS — Z8551 Personal history of malignant neoplasm of bladder: Secondary | ICD-10-CM | POA: Diagnosis not present

## 2023-06-13 LAB — URINALYSIS, ROUTINE W REFLEX MICROSCOPIC
Bilirubin, UA: NEGATIVE
Glucose, UA: NEGATIVE
Ketones, UA: NEGATIVE
Leukocytes,UA: NEGATIVE
Nitrite, UA: NEGATIVE
Protein,UA: NEGATIVE
RBC, UA: NEGATIVE
Specific Gravity, UA: <1.005 — ABNORMAL LOW (ref 1.005–1.030)
Urobilinogen, Ur: 0.2 mg/dL (ref 0.2–1.0)
pH, UA: 7.5 (ref 5.0–7.5)

## 2023-06-13 MED ORDER — MIRABEGRON ER 25 MG PO TB24
25.0000 mg | ORAL_TABLET | Freq: Every day | ORAL | Status: DC
Start: 2023-06-13 — End: 2023-12-14

## 2023-06-13 MED ORDER — CIPROFLOXACIN HCL 500 MG PO TABS
500.0000 mg | ORAL_TABLET | Freq: Once | ORAL | Status: AC
Start: 2023-06-13 — End: 2023-06-13
  Administered 2023-06-13: 500 mg via ORAL

## 2023-06-13 NOTE — Progress Notes (Signed)
 06/13/2023 11:25 AM   Julie Villa 12-Jul-1960 [id number]  Referring provider: Joycelyn Rua, MD [address] [place],  [place] [address]  Followup UTI   HPI: Julie Villa is a 63yo here for followup for recurent UTI, OAB and bladder cancer. No UTis since last visit. She remains on macrodantin prophylaxis. Her urinary urgency and frequency is fair with vesicare. She continues to be bothered by her 1-2hr urinary frequency.  No dysuria or hematuria. No opther complaints today   PMH: Past Medical History:  Diagnosis Date   Anemia    hx of several yrs ago   Bladder tumor    Breast cancer (HCC) 2012   left   Frequency of urination    Hematuria    History of left breast cancer dx 2012  --- oncologist-  dr Pamelia Hoit ([facility])-- per lov note, no recurrence   Left UIQ - Invasive ductial carcinoma, Invasive Lobular carcinoma w/ LCIS; Stage IA (T1b, N0, M0)--- s/p breast lumpectomy w/ snl dissection and radiation therapy completed 01-03-2011   History of radiation therapy completed 01-03-2011   total 61Gy  , left breast    History of radiation therapy 2012   Personal history of radiation therapy    Wears glasses     Surgical History: Past Surgical History:  Procedure Laterality Date   APPENDECTOMY  1987   BREAST LUMPECTOMY Left 2012   BREAST LUMPECTOMY WITH AXILLARY LYMPH NODE DISSECTION Left 09-21-2010  dr Jamey Ripa  [facility]   CATARACT EXTRACTION W/ INTRAOCULAR LENS IMPLANT Right 06/2016   CYSTOSCOPY N/A 02/05/2020   Procedure: CYSTOSCOPY;  Surgeon: Malen Gauze, MD;  Location: [facility];  Service: Urology;  Laterality: N/A;   CYSTOSCOPY W/ RETROGRADES Bilateral 01/19/2017   Procedure: CYSTOSCOPY WITH RETROGRADE PYELOGRAM;  Surgeon: Malen Gauze, MD;  Location: [facility];  Service: Urology;  Laterality: Bilateral;   CYSTOSCOPY W/ RETROGRADES Bilateral 08/08/2021   Procedure: CYSTOSCOPY WITH RETROGRADE PYELOGRAM;  Surgeon: Malen Gauze, MD;  Location: [facility];  Service: Urology;  Laterality: Bilateral;   NASAL SINUS SURGERY  06/2008   TRANSURETHRAL RESECTION OF BLADDER TUMOR N/A 01/19/2017   Procedure: TRANSURETHRAL RESECTION OF BLADDER TUMOR (TURBT);  Surgeon: Malen Gauze, MD;  Location: [facility];  Service: Urology;  Laterality: N/A;   TRANSURETHRAL RESECTION OF BLADDER TUMOR N/A 11/29/2017   Procedure: TRANSURETHRAL RESECTION OF BLADDER TUMOR (TURBT);  Surgeon: Malen Gauze, MD;  Location: [facility];  Service: Urology;  Laterality: N/A;   TRANSURETHRAL RESECTION OF BLADDER TUMOR N/A 02/05/2020   Procedure: TRANSURETHRAL RESECTION OF BLADDER TUMOR (TURBT);  Surgeon: Malen Gauze, MD;  Location: [facility];  Service: Urology;  Laterality: N/A;   TRANSURETHRAL RESECTION OF BLADDER TUMOR N/A 08/08/2021   Procedure: TRANSURETHRAL RESECTION OF BLADDER TUMOR (TURBT);  Surgeon: Malen Gauze, MD;  Location: [facility];  Service: Urology;  Laterality: N/A;    Home Medications:  Allergies as of 06/13/2023       Reactions   Penicillins Rash   Sulfa Antibiotics Rash        Medication List        Accurate as of June 13, 2023 11:25 AM. If you have any questions, ask your nurse or doctor.          STOP taking these medications    Vitamin D (Ergocalciferol) 1.25 MG (50000 UNIT) Caps capsule Commonly known as: DRISDOL       TAKE these  medications    CALCIUM 600+D3 PO Take 1 tablet by mouth 2 (two) times daily.   Iron 325 (65 Fe) MG Tabs Take 325 mg by mouth daily.   Magnesium 400 MG Tabs Take 400 mg by mouth at bedtime.   nitrofurantoin 50 MG capsule Commonly known as: MACRODANTIN Take 1 capsule (50 mg total) by mouth at bedtime.   nitrofurantoin 50 MG capsule Commonly known as: MACRODANTIN TAKE 1 CAPSULE BY MOUTH EVERYDAY AT BEDTIME   PROBIOTIC-10 PO Take 1 capsule by mouth daily.   simvastatin 20 MG tablet Commonly known as: ZOCOR Take 20  mg by mouth every evening.   solifenacin 10 MG tablet Commonly known as: VESICARE Take 10 mg by mouth every morning.        Allergies:  Allergies  Allergen Reactions   Penicillins Rash   Sulfa Antibiotics Rash    Family History: Family History  Problem Relation Age of Onset   Breast cancer Mother    Cancer Mother        breast   Hypertension Mother    Hyperlipidemia Mother    Diabetes Father    Heart disease Father    Hyperlipidemia Father    Hypertension Father    Diabetes Sister    Hypertension Sister    Hyperlipidemia Sister     Social History:  reports that she quit smoking about 41 years ago. Her smoking use included cigarettes. She started smoking about 44 years ago. She has never used smokeless tobacco. She reports current alcohol use of about 1.0 standard drink of alcohol per week. She reports that she does not use drugs.  ROS: All other review of systems were reviewed and are negative except what is noted above in HPI  Physical Exam: BP 134/84   Pulse 61   LMP 04/06/2011   Constitutional:  Alert and oriented, No acute distress. HEENT: [place] AT, moist mucus membranes.  Trachea midline, no masses. Cardiovascular: No clubbing, cyanosis, or edema. Respiratory: Normal respiratory effort, no increased work of breathing. GI: Abdomen is soft, nontender, nondistended, no abdominal masses GU: No CVA tenderness.  Lymph: No cervical or inguinal lymphadenopathy. Skin: No rashes, bruises or suspicious lesions. Neurologic: Grossly intact, no focal deficits, moving all 4 extremities. Psychiatric: Normal mood and affect.  Laboratory Data: Lab Results  Component Value Date   WBC 5.3 08/04/2021   HGB 12.8 08/04/2021   HCT 39.2 08/04/2021   MCV 96.6 08/04/2021   PLT 225 08/04/2021    Lab Results  Component Value Date   CREATININE 0.93 01/26/2021    No results found for: "PSA"  No results found for: "TESTOSTERONE"  No results found for:  "HGBA1C"  Urinalysis    Component Value Date/Time   APPEARANCEUR Clear 12/13/2022 1134   GLUCOSEU Negative 12/13/2022 1134   BILIRUBINUR Negative 12/13/2022 1134   PROTEINUR Negative 12/13/2022 1134   UROBILINOGEN negative (A) 10/22/2019 0930   NITRITE Negative 12/13/2022 1134   LEUKOCYTESUR Negative 12/13/2022 1134    Lab Results  Component Value Date   LABMICR Comment 12/13/2022   WBCUA 11-30 (A) 10/25/2021   LABEPIT 0-10 10/25/2021   BACTERIA Few (A) 10/25/2021    Pertinent Imaging:  No results found for this or any previous visit.  No results found for this or any previous visit.  No results found for this or any previous visit.  No results found for this or any previous visit.  No results found for this or any previous visit.  No results found  for this or any previous visit.  Results for orders placed during the hospital encounter of 01/28/21  CT HEMATURIA WORKUP  Narrative CLINICAL DATA:  Hematuria  EXAM: CT ABDOMEN AND PELVIS WITHOUT AND WITH CONTRAST  TECHNIQUE: Multidetector CT imaging of the abdomen and pelvis was performed following the standard protocol before and following the bolus administration of intravenous contrast.  CONTRAST:  OMNIPAQUE IOHEXOL 350 MG/ML SOLN  COMPARISON:  None.  FINDINGS: Lower chest: No acute abnormality.  Hepatobiliary: No suspicious liver lesions. No gallstones, gallbladder wall thickening, or biliary dilatation.  Pancreas: Unremarkable. No pancreatic ductal dilatation or surrounding inflammatory changes.  Spleen: Normal in size without focal abnormality.  Adrenals/Urinary Tract: Bilateral adrenal glands are unremarkable. No hydronephrosis. No nephrolithiasis. Delayed imaging demonstrates no suspicious filling defects of the renal collecting systems, although the distal right ureter is incompletely opacified. Bladder is unremarkable.  Stomach/Bowel: Stomach is within normal limits. Appendix  is surgically absent. No evidence of bowel wall thickening, distention, or inflammatory changes.  Vascular/Lymphatic: Aortic atherosclerosis. No enlarged abdominal or pelvic lymph nodes.  Reproductive: Uterus and bilateral adnexa are unremarkable.  Other: No abdominal wall hernia or abnormality. No abdominopelvic ascites.  Musculoskeletal: No acute or significant osseous findings.  IMPRESSION: No nephroureterolithiasis or suspicious filling defects of the opacified renal collecting systems.  Aortic Atherosclerosis (ICD10-I70.0).   Electronically Signed By: Allegra Lai M.D. On: 01/28/2021 12:39  No results found for this or any previous visit.    Cystoscopy Procedure Note  Patient identification was confirmed, informed consent was obtained, and patient was prepped using Betadine solution.  Lidocaine jelly was administered per urethral meatus.    Procedure: - Flexible cystoscope introduced, without any difficulty.   - Thorough search of the bladder revealed:    normal urethral meatus    normal urothelium    no stones    no ulcers     no tumors    no urethral polyps    no trabeculation  - Ureteral orifices were normal in position and appearance.  Post-Procedure: - Patient tolerated the procedure well    Assessment & Plan:    1. Cancer of lateral wall of urinary bladder (HCC) (Primary) -followup 6 months for cystoscopy - Cystoscopy (Bedside) - ciprofloxacin (CIPRO) tablet 500 mg - Urinalysis, Routine w reflex microscopic  2. Chronic cystitis with hematuria Continue macrodantin 50mg  qhs  3. OAB (overactive bladder) Mirabegeron 25mg  daily   No follow-ups on file.  Wilkie Aye, MD  [facility] [place]

## 2023-06-19 ENCOUNTER — Encounter: Payer: Self-pay | Admitting: Urology

## 2023-06-19 NOTE — Patient Instructions (Signed)

## 2023-08-06 ENCOUNTER — Other Ambulatory Visit: Payer: Self-pay | Admitting: Adult Health

## 2023-08-06 DIAGNOSIS — Z1231 Encounter for screening mammogram for malignant neoplasm of breast: Secondary | ICD-10-CM

## 2023-09-07 ENCOUNTER — Ambulatory Visit

## 2023-09-11 ENCOUNTER — Ambulatory Visit
Admission: RE | Admit: 2023-09-11 | Discharge: 2023-09-11 | Disposition: A | Discharge status: Home / Self Care | Source: Ambulatory Visit | Attending: Adult Health | Admitting: Adult Health

## 2023-09-11 DIAGNOSIS — Z1231 Encounter for screening mammogram for malignant neoplasm of breast: Secondary | ICD-10-CM

## 2023-12-14 ENCOUNTER — Encounter (HOSPITAL_BASED_OUTPATIENT_CLINIC_OR_DEPARTMENT_OTHER): Payer: Self-pay | Admitting: Orthopedic Surgery

## 2023-12-18 ENCOUNTER — Encounter (HOSPITAL_BASED_OUTPATIENT_CLINIC_OR_DEPARTMENT_OTHER): Payer: Self-pay | Admitting: Orthopedic Surgery

## 2023-12-18 NOTE — Anesthesia Preprocedure Evaluation (Signed)
 Anesthesia Evaluation  Patient identified by MRN, date of birth, ID band Patient awake    Reviewed: Allergy & Precautions, NPO status , Patient's Chart, lab work & pertinent test results  Airway Mallampati: II  TM Distance: >3 FB Neck ROM: Full    Dental no notable dental hx. (+) Teeth Intact, Caps, Dental Advisory Given   Pulmonary former smoker   Pulmonary exam normal breath sounds clear to auscultation       Cardiovascular negative cardio ROS Normal cardiovascular exam Rhythm:Regular Rate:Normal     Neuro/Psych negative neurological ROS  negative psych ROS   GI/Hepatic negative GI ROS, Neg liver ROS,,,  Endo/Other  Hx/o left breast Ca- S/P lumpectomy+ SNB, RT Hyperlipidemia  Renal/GU negative Renal ROS Bladder dysfunction  Hx/o bladder Ca S/P resection RT OAB    Musculoskeletal Trigger middle and ring finger of right hand    Abdominal   Peds  Hematology  (+) Blood dyscrasia, anemia   Anesthesia Other Findings   Reproductive/Obstetrics                              Anesthesia Physical Anesthesia Plan  ASA: 2  Anesthesia Plan: MAC   Post-op Pain Management: Minimal or no pain anticipated   Induction: Intravenous  PONV Risk Score and Plan: 3 and Treatment may vary due to age or medical condition and Propofol  infusion  Airway Management Planned: Natural Airway, Nasal Cannula and Simple Face Mask  Additional Equipment: None  Intra-op Plan:   Post-operative Plan:   Informed Consent: I have reviewed the patients History and Physical, chart, labs and discussed the procedure including the risks, benefits and alternatives for the proposed anesthesia with the patient or authorized representative who has indicated his/her understanding and acceptance.     Dental advisory given  Plan Discussed with: CRNA and Anesthesiologist  Anesthesia Plan Comments:           Anesthesia Quick Evaluation

## 2023-12-19 ENCOUNTER — Other Ambulatory Visit: Payer: Self-pay

## 2023-12-19 ENCOUNTER — Ambulatory Visit (HOSPITAL_BASED_OUTPATIENT_CLINIC_OR_DEPARTMENT_OTHER): Admitting: Anesthesiology

## 2023-12-19 ENCOUNTER — Other Ambulatory Visit: Admitting: Urology

## 2023-12-19 ENCOUNTER — Ambulatory Visit (HOSPITAL_BASED_OUTPATIENT_CLINIC_OR_DEPARTMENT_OTHER)
Admission: RE | Admit: 2023-12-19 | Discharge: 2023-12-19 | Disposition: A | Discharge status: Home / Self Care | Attending: Orthopedic Surgery | Admitting: Orthopedic Surgery

## 2023-12-19 ENCOUNTER — Encounter (HOSPITAL_BASED_OUTPATIENT_CLINIC_OR_DEPARTMENT_OTHER): Payer: Self-pay | Admitting: Orthopedic Surgery

## 2023-12-19 ENCOUNTER — Encounter (HOSPITAL_BASED_OUTPATIENT_CLINIC_OR_DEPARTMENT_OTHER)
Admission: RE | Disposition: A | Discharge status: Home / Self Care | Payer: Self-pay | Source: Home / Self Care | Attending: Orthopedic Surgery

## 2023-12-19 DIAGNOSIS — M65341 Trigger finger, right ring finger: Secondary | ICD-10-CM | POA: Insufficient documentation

## 2023-12-19 DIAGNOSIS — Z8551 Personal history of malignant neoplasm of bladder: Secondary | ICD-10-CM | POA: Insufficient documentation

## 2023-12-19 DIAGNOSIS — M65331 Trigger finger, right middle finger: Secondary | ICD-10-CM

## 2023-12-19 DIAGNOSIS — E785 Hyperlipidemia, unspecified: Secondary | ICD-10-CM | POA: Diagnosis not present

## 2023-12-19 DIAGNOSIS — Z853 Personal history of malignant neoplasm of breast: Secondary | ICD-10-CM | POA: Insufficient documentation

## 2023-12-19 DIAGNOSIS — N3281 Overactive bladder: Secondary | ICD-10-CM | POA: Diagnosis not present

## 2023-12-19 DIAGNOSIS — M65841 Other synovitis and tenosynovitis, right hand: Secondary | ICD-10-CM | POA: Insufficient documentation

## 2023-12-19 DIAGNOSIS — D649 Anemia, unspecified: Secondary | ICD-10-CM | POA: Diagnosis not present

## 2023-12-19 DIAGNOSIS — Z87891 Personal history of nicotine dependence: Secondary | ICD-10-CM | POA: Insufficient documentation

## 2023-12-19 HISTORY — DX: Overactive bladder: N32.81

## 2023-12-19 HISTORY — PX: TRIGGER FINGER RELEASE: SHX641

## 2023-12-19 SURGERY — RELEASE, A1 PULLEY, FOR TRIGGER FINGER
Anesthesia: Monitor Anesthesia Care | Site: Hand | Laterality: Right

## 2023-12-19 MED ORDER — FENTANYL CITRATE (PF) 100 MCG/2ML IJ SOLN
INTRAMUSCULAR | Status: AC
  Filled 2023-12-19: qty 2

## 2023-12-19 MED ORDER — KETOROLAC TROMETHAMINE 30 MG/ML IJ SOLN
INTRAMUSCULAR | Status: DC | PRN
  Administered 2023-12-19: 15 mg via INTRAVENOUS

## 2023-12-19 MED ORDER — 0.9 % SODIUM CHLORIDE (POUR BTL) OPTIME
TOPICAL | Status: DC | PRN
  Administered 2023-12-19: 250 mL

## 2023-12-19 MED ORDER — ONDANSETRON HCL 4 MG/2ML IJ SOLN: 4.0000 mg | Freq: Once | INTRAMUSCULAR | Status: DC | PRN

## 2023-12-19 MED ORDER — CEFAZOLIN SODIUM-DEXTROSE 2-4 GM/100ML-% IV SOLN
INTRAVENOUS | Status: AC
Start: 2023-12-19 — End: 2023-12-19
  Filled 2023-12-19: qty 100

## 2023-12-19 MED ORDER — PROPOFOL 500 MG/50ML IV EMUL
INTRAVENOUS | Status: DC | PRN
  Administered 2023-12-19: 125 ug/kg/min via INTRAVENOUS

## 2023-12-19 MED ORDER — LACTATED RINGERS IV SOLN: INTRAVENOUS | Status: DC

## 2023-12-19 MED ORDER — MIDAZOLAM HCL 2 MG/2ML IJ SOLN
INTRAMUSCULAR | Status: AC
  Filled 2023-12-19: qty 2

## 2023-12-19 MED ORDER — FENTANYL CITRATE (PF) 100 MCG/2ML IJ SOLN: 25.0000 ug | INTRAMUSCULAR | Status: DC | PRN

## 2023-12-19 MED ORDER — OXYCODONE HCL 5 MG PO TABS: 5.0000 mg | ORAL_TABLET | Freq: Once | ORAL | Status: DC | PRN

## 2023-12-19 MED ORDER — BUPIVACAINE HCL 0.25 % IJ SOLN
INTRAMUSCULAR | Status: DC | PRN
  Administered 2023-12-19: 10 mL

## 2023-12-19 MED ORDER — PROPOFOL 500 MG/50ML IV EMUL
INTRAVENOUS | Status: AC
Start: 2023-12-19 — End: 2023-12-19
  Filled 2023-12-19: qty 100

## 2023-12-19 MED ORDER — BUPIVACAINE HCL (PF) 0.25 % IJ SOLN
INTRAMUSCULAR | Status: AC
Start: 2023-12-19 — End: 2023-12-19
  Filled 2023-12-19: qty 30

## 2023-12-19 MED ORDER — ONDANSETRON HCL 4 MG/2ML IJ SOLN
INTRAMUSCULAR | Status: DC | PRN
Start: 2023-12-19 — End: 2023-12-19
  Administered 2023-12-19: 4 mg via INTRAVENOUS

## 2023-12-19 MED ORDER — OXYCODONE HCL 5 MG/5ML PO SOLN: 5.0000 mg | Freq: Once | ORAL | Status: DC | PRN

## 2023-12-19 MED ORDER — PROPOFOL 1000 MG/100ML IV EMUL
INTRAVENOUS | Status: AC
Start: 2023-12-19 — End: 2023-12-19
  Filled 2023-12-19: qty 400

## 2023-12-19 MED ORDER — OXYCODONE HCL 5 MG PO TABS: 5.0000 mg | ORAL_TABLET | Freq: Four times a day (QID) | ORAL | 0 refills | Status: AC | PRN

## 2023-12-19 MED ORDER — MIDAZOLAM HCL 2 MG/2ML IJ SOLN
INTRAMUSCULAR | Status: DC | PRN
  Administered 2023-12-19: 2 mg via INTRAVENOUS

## 2023-12-19 MED ORDER — LIDOCAINE HCL (CARDIAC) PF 100 MG/5ML IV SOSY
PREFILLED_SYRINGE | INTRAVENOUS | Status: DC | PRN
  Administered 2023-12-19: 20 mg via INTRAVENOUS

## 2023-12-19 MED ORDER — CEFAZOLIN SODIUM-DEXTROSE 2-4 GM/100ML-% IV SOLN
2.0000 g | INTRAVENOUS | Status: AC
  Administered 2023-12-19: 2 g via INTRAVENOUS

## 2023-12-19 MED ORDER — FENTANYL CITRATE (PF) 100 MCG/2ML IJ SOLN
INTRAMUSCULAR | Status: DC | PRN
  Administered 2023-12-19: 25 ug via INTRAVENOUS

## 2023-12-19 MED ORDER — DROPERIDOL 2.5 MG/ML IJ SOLN: 0.6250 mg | Freq: Once | INTRAMUSCULAR | Status: DC | PRN

## 2023-12-19 MED ORDER — DEXMEDETOMIDINE HCL IN NACL 80 MCG/20ML IV SOLN
INTRAVENOUS | Status: AC
  Filled 2023-12-19: qty 40

## 2023-12-19 MED ORDER — DEXMEDETOMIDINE HCL IN NACL 80 MCG/20ML IV SOLN
INTRAVENOUS | Status: DC | PRN
  Administered 2023-12-19: 4 ug via INTRAVENOUS

## 2023-12-19 SURGICAL SUPPLY — 27 items
BLADE SURG 15 STRL LF DISP TIS (BLADE) ×1 IMPLANT
BNDG COMPR ESMARK 4X3 LF (GAUZE/BANDAGES/DRESSINGS) ×1 IMPLANT
BNDG ELASTIC 2INX 5YD STR LF (GAUZE/BANDAGES/DRESSINGS) ×1 IMPLANT
CHLORAPREP W/TINT 26 (MISCELLANEOUS) ×1 IMPLANT
CORD BIPOLAR FORCEPS 12FT (ELECTRODE) ×1 IMPLANT
COVER BACK TABLE 60X90IN (DRAPES) ×1 IMPLANT
CUFF TOURN SGL QUICK 18X4 (TOURNIQUET CUFF) IMPLANT
CUFF TRNQT CYL 24X4X16.5-23 (TOURNIQUET CUFF) IMPLANT
DRAPE EXTREMITY T 121X128X90 (DISPOSABLE) ×1 IMPLANT
DRAPE SURG 17X23 STRL (DRAPES) ×1 IMPLANT
GAUZE SPONGE 4X4 12PLY STRL (GAUZE/BANDAGES/DRESSINGS) IMPLANT
GAUZE XEROFORM 1X8 LF (GAUZE/BANDAGES/DRESSINGS) ×1 IMPLANT
GLOVE BIO SURGEON STRL SZ7 (GLOVE) ×1 IMPLANT
GLOVE BIOGEL PI IND STRL 7.0 (GLOVE) ×1 IMPLANT
GOWN STRL REUS W/ TWL LRG LVL3 (GOWN DISPOSABLE) ×2 IMPLANT
NDL HYPO 25X1 1.5 SAFETY (NEEDLE) ×1 IMPLANT
NEEDLE HYPO 25X1 1.5 SAFETY (NEEDLE) ×1 IMPLANT
NS IRRIG 1000ML POUR BTL (IV SOLUTION) ×1 IMPLANT
PACK BASIN DAY SURGERY FS (CUSTOM PROCEDURE TRAY) ×1 IMPLANT
SHEET MEDIUM DRAPE 40X70 STRL (DRAPES) ×1 IMPLANT
SUT ETHILON 4 0 PS 2 18 (SUTURE) ×1 IMPLANT
SUT VIC AB 4-0 PS2 18 (SUTURE) IMPLANT
SUT VICRYL RAPIDE 4/0 PS 2 (SUTURE) IMPLANT
SYR BULB EAR ULCER 3OZ GRN STR (SYRINGE) ×1 IMPLANT
SYR CONTROL 10ML LL (SYRINGE) ×1 IMPLANT
TOWEL GREEN STERILE FF (TOWEL DISPOSABLE) ×1 IMPLANT
UNDERPAD 30X36 HEAVY ABSORB (UNDERPADS AND DIAPERS) ×1 IMPLANT

## 2023-12-19 NOTE — H&P (Signed)
 HAND SURGERY   HPI: Patient is a 63 y.o. female who presents with right middle and ring finger stenosing tenosynovitis that has failed conservative management..  Patient denies any changes to their medical history or new systemic symptoms today.    Past Medical History:  Diagnosis Date   Anemia    hx of several yrs ago   Bladder tumor    Breast cancer (HCC) 2012   left   Frequency of urination    Hematuria    History of left breast cancer dx 2012  --- oncologist-  dr odean ([facility])-- per lov note, no recurrence   Left UIQ - Invasive ductial carcinoma, Invasive Lobular carcinoma w/ LCIS; Stage IA (T1b, N0, M0)--- s/p breast lumpectomy w/ snl dissection and radiation therapy completed 01-03-2011   History of radiation therapy completed 01-03-2011   total 61Gy  , left breast    History of radiation therapy 2012   OAB (overactive bladder)    Personal history of radiation therapy    Wears glasses    Past Surgical History:  Procedure Laterality Date   APPENDECTOMY  1987   BREAST LUMPECTOMY Left 2012   BREAST LUMPECTOMY WITH AXILLARY LYMPH NODE DISSECTION Left 09-21-2010  dr merrilyn  [facility]   CATARACT EXTRACTION W/ INTRAOCULAR LENS IMPLANT Right 06/2016   CYSTOSCOPY N/A 02/05/2020   Procedure: CYSTOSCOPY;  Surgeon: Sherrilee Belvie CROME, MD;  Location: [facility];  Service: Urology;  Laterality: N/A;   CYSTOSCOPY W/ RETROGRADES Bilateral 01/19/2017   Procedure: CYSTOSCOPY WITH RETROGRADE PYELOGRAM;  Surgeon: Sherrilee Belvie CROME, MD;  Location: [facility];  Service: Urology;  Laterality: Bilateral;   CYSTOSCOPY W/ RETROGRADES Bilateral 08/08/2021   Procedure: CYSTOSCOPY WITH RETROGRADE PYELOGRAM;  Surgeon: Sherrilee Belvie CROME, MD;  Location: [facility];  Service: Urology;  Laterality: Bilateral;   NASAL SINUS SURGERY  06/2008   TRANSURETHRAL RESECTION OF BLADDER TUMOR N/A 01/19/2017   Procedure: TRANSURETHRAL RESECTION OF BLADDER TUMOR (TURBT);  Surgeon: Sherrilee Belvie CROME, MD;  Location: [facility];  Service: Urology;  Laterality: N/A;   TRANSURETHRAL RESECTION OF BLADDER TUMOR N/A 11/29/2017   Procedure: TRANSURETHRAL RESECTION OF BLADDER TUMOR (TURBT);  Surgeon: Sherrilee Belvie CROME, MD;  Location: [facility];  Service: Urology;  Laterality: N/A;   TRANSURETHRAL RESECTION OF BLADDER TUMOR N/A 02/05/2020   Procedure: TRANSURETHRAL RESECTION OF BLADDER TUMOR (TURBT);  Surgeon: Sherrilee Belvie CROME, MD;  Location: [facility];  Service: Urology;  Laterality: N/A;   TRANSURETHRAL RESECTION OF BLADDER TUMOR N/A 08/08/2021   Procedure: TRANSURETHRAL RESECTION OF BLADDER TUMOR (TURBT);  Surgeon: Sherrilee Belvie CROME, MD;  Location: [facility];  Service: Urology;  Laterality: N/A;   Social History   Socioeconomic History   Marital status: Married    Spouse name: Not on file   Number of children: Not on file   Years of education: Not on file   Highest education level: Not on file  Occupational History   Not on file  Tobacco Use   Smoking status: Former    Current packs/day: 0.00    Types: Cigarettes    Start date: 01/18/1979    Quit date: 01/17/1982    Years since quitting: 41.9   Smokeless tobacco: Never  Vaping Use   Vaping status: Never Used  Substance and Sexual Activity   Alcohol use: Yes    Alcohol/week: 1.0 standard drink of alcohol    Types: 1 Glasses of wine per week    Comment: glass wine  couple times a month   Drug use: No   Sexual activity: Yes    Birth control/protection: Post-menopausal  Other Topics Concern   Not on file  Social History Narrative   Not on file   Social Drivers of Health   Financial Resource Strain: Not on file  Food Insecurity: Not on file  Transportation Needs: Not on file  Physical Activity: Not on file  Stress: Not on file  Social Connections: Not on file   Family History  Problem Relation Age of Onset   Breast cancer Mother    Cancer Mother        breast   Hypertension  Mother    Hyperlipidemia Mother    Diabetes Father    Heart disease Father    Hyperlipidemia Father    Hypertension Father    Diabetes Sister    Hypertension Sister    Hyperlipidemia Sister    - negative except otherwise stated in the family history section Allergies  Allergen Reactions   Penicillins Rash   Sulfa Antibiotics Rash   Prior to Admission medications   Medication Sig Start Date End Date Taking? Authorizing Provider  Calcium Carbonate-Vitamin D (CALCIUM 600+D3 PO) Take 1 tablet by mouth 2 (two) times daily.   Yes [provider]  Ferrous Sulfate (IRON) 325 (65 Fe) MG TABS Take 325 mg by mouth daily.   Yes [provider]  Magnesium 400 MG TABS Take 400 mg by mouth at bedtime.    Yes [provider]  nitrofurantoin  (MACRODANTIN ) 50 MG capsule TAKE 1 CAPSULE BY MOUTH EVERYDAY AT BEDTIME 04/25/23  Yes McKenzie, Belvie CROME, MD  Probiotic Product (PROBIOTIC-10 PO) Take 1 capsule by mouth daily.    Yes [provider]  simvastatin (ZOCOR) 20 MG tablet Take 20 mg by mouth every evening. 09/30/20  Yes [provider]  solifenacin (VESICARE) 10 MG tablet Take 10 mg by mouth every morning.   Yes [provider]   No results found. - Positive ROS: All other systems have been reviewed and were otherwise negative with the exception of those mentioned in the HPI and as above.  Physical Exam: General: No acute distress, resting comfortably Cardiovascular: BUE warm and well perfused, normal rate Respiratory: Normal WOB on RA Skin: Warm and dry Neurologic: Sensation intact distally Psychiatric: Patient is at baseline mood and affect  Right upper Extremity  Tender palpation over the middle and ring finger A1 pulleys.  There is visible locking and catching with active range of motion.  She has full concerns of motion of the fingers.  Sensation is intact to light touch of the hand.  All fingers are pink and  well-perfused.   Assessment: 63 year old female with right middle and ring finger stenosing tenosynovitis that has failed conservative management  Plan: OR today for right ring finger pulley releases. We again reviewed the risks of surgery which include bleeding, infection, damage to neurovascular structures, persistent symptoms, finger stiffness, need for additional surgery.  Informed consent was signed.  All questions were answered.   Bebe Galla, M.D. [facility] 8:16 AM

## 2023-12-19 NOTE — Addendum Note (Signed)
 Addendum  created 12/19/23 1231 by Donnell Berwyn SQUIBB, CRNA   Flowsheet accepted

## 2023-12-19 NOTE — Transfer of Care (Signed)
 Immediate Anesthesia Transfer of Care Note  Patient: Julie Villa  Procedure(s) Performed: RELEASE, A1 PULLEY, FOR TRIGGER FINGER (Right: Hand)  Patient Location: PACU  Anesthesia Type:MAC  Level of Consciousness: drowsy and patient cooperative  Airway & Oxygen Therapy: Patient Spontanous Breathing and Patient connected to face mask oxygen  Post-op Assessment: Report given to RN and Post -op Vital signs reviewed and stable  Post vital signs: Reviewed and stable  Last Vitals:  Vitals Value Taken Time  BP    Temp 36.8 C 12/19/23 09:09  Pulse    Resp    SpO2      Last Pain:  Vitals:   12/19/23 0730  TempSrc: Temporal  PainSc: 0-No pain         Complications: No notable events documented.

## 2023-12-19 NOTE — Discharge Instructions (Addendum)
 Julie Villa, M.D. Hand Surgery  POST-OPERATIVE DISCHARGE INSTRUCTIONS   PRESCRIPTIONS: - You may have been given a prescription to be taken as directed for post-operative pain control.  You may also take over the counter ibuprofen/aleve and tylenol  for pain. Take this as directed on the packaging. Do not exceed 3000 mg tylenol /acetaminophen  in 24 hours.  Ibuprofen 600-800 mg (3-4) tablets by mouth every 6 hours as needed for pain.   OR  Aleve 2 tablets by mouth every 12 hours (twice daily) as needed for pain.   AND/OR  Tylenol  1000 mg (2 tablets) every 8 hours as needed for pain.  - Please use your pain medication carefully, as refills are limited and you may not be provided with one.  As stated above, please use over the counter pain medicine - it will also be helpful with decreasing your swelling.    ANESTHESIA: -After your surgery, post-surgical discomfort or pain is likely. This discomfort can last several days to a few weeks. At certain times of the day your discomfort may be more intense.   Did you receive a nerve block?   - A nerve block can provide pain relief for one hour to two days after your surgery. As long as the nerve block is working, you will experience little or no sensation in the area the surgeon operated on.  - As the nerve block wears off, you will begin to experience pain or discomfort. It is very important that you begin taking your prescribed pain medication before the nerve block fully wears off. Treating your pain at the first sign of the block wearing off will ensure your pain is better controlled and more tolerable when full-sensation returns. Do not wait until the pain is intolerable, as the medicine will be less effective. It is better to treat pain in advance than to try and catch up.   General Anesthesia:  If you did not receive a nerve block during your surgery, you will need to start taking your pain medication shortly after your surgery and  should continue to do so as prescribed by your surgeon.     ICE AND ELEVATION: - You may use ice for the first 48-72 hours, but it is not critical.   - Motion of your fingers is very important to decrease the swelling.  - Elevation, as much as possible for the next 48 hours, is critical for decreasing swelling as well as for pain relief. Elevation means when you are seated or lying down, you hand should be at or above your heart. When walking, the hand needs to be at or above the level of your elbow.  - If the bandage gets too tight, it may need to be loosened. Please contact our office and we will instruct you in how to do this.    SURGICAL BANDAGES:  - Keep your dressing and/or splint clean and dry at all times.  You can remove your dressing 7 days from now and change with a dry dressing or Band-Aids as needed thereafter. - You may place a plastic bag over your bandage to shower, but be careful, do not get your bandages wet.  - After the bandages have been removed, it is OK to get the stitches wet in a shower or with hand washing. Do Not soak or submerge the wound yet. Please do not use lotions or creams on the stitches.      HAND THERAPY:  - You may not need any. If you  do, we will begin this at your follow up visit in the clinic.    ACTIVITY AND WORK: - You are encouraged to move any fingers which are not in the bandage.  - Light use of the fingers is allowed to assist the other hand with daily hygiene and eating, but strong gripping or lifting is often uncomfortable and should be avoided.  - You might miss a variable period of time from work and hopefully this issue has been discussed prior to surgery. You may not do any heavy work with your affected hand for about 2 weeks.    [facility] Second Floor, [address] [place], [place] [address] [phone]      No NSAIDs (Ibuprofen/Motrin/Aleve) until 5pm if needed.

## 2023-12-19 NOTE — Anesthesia Postprocedure Evaluation (Signed)
 Anesthesia Post Note  Patient: Julie Villa  Procedure(s) Performed: RELEASE, A1 PULLEY, FOR TRIGGER FINGER (Right: Hand)     Patient location during evaluation: PACU Anesthesia Type: MAC Level of consciousness: awake and alert and oriented Pain management: pain level controlled Vital Signs Assessment: post-procedure vital signs reviewed and stable Respiratory status: spontaneous breathing, nonlabored ventilation and respiratory function stable Cardiovascular status: stable and blood pressure returned to baseline Postop Assessment: no apparent nausea or vomiting Anesthetic complications: no   No notable events documented.  Last Vitals:  Vitals:   12/19/23 0909 12/19/23 0915  BP: 109/62 117/63  Pulse: (!) 58 66  Resp: 12 18  Temp: 36.8 C   SpO2: 99% 100%    Last Pain:  Vitals:   12/19/23 0909  TempSrc:   PainSc: 0-No pain                 Talene Glastetter A.

## 2023-12-19 NOTE — Interval H&P Note (Signed)
 History and Physical Interval Note:  12/19/2023 8:17 AM  Julie Villa  has presented today for surgery, with the diagnosis of Acquired trigger finger of right middle finger.  The various methods of treatment have been discussed with the patient and family. After consideration of risks, benefits and other options for treatment, the patient has consented to  Procedure(s) with comments: RELEASE, A1 PULLEY, FOR TRIGGER FINGER (Right) - middle and ring finger as a surgical intervention.  The patient's history has been reviewed, patient examined, no change in status, stable for surgery.  I have reviewed the patient's chart and labs.  Questions were answered to the patient's satisfaction.     Dinara Lupu

## 2023-12-19 NOTE — Op Note (Signed)
   Date of Surgery: 12/19/2023  INDICATIONS: Patient is a 63 y.o.-year-old female with right middle and ring finger stenosing tenosynovitis that has failed conservative management.  Risks, benefits, and alternatives to surgery were again discussed with the patient in the preoperative area. The patient wishes to proceed with surgery.  Informed consent was signed after our discussion.   PREOPERATIVE DIAGNOSIS:  Right middle finger stenosing tenosynovitis Right ring finger stenosing tenosynovitis  POSTOPERATIVE DIAGNOSIS: Same.  PROCEDURE:  Right middle finger A1 pulley release Right ring finger A1 pulley release   SURGEON: Carlin Galla, M.D.  ASSIST: None  ANESTHESIA:  Local, MAC  IV FLUIDS AND URINE: See anesthesia.  ESTIMATED BLOOD LOSS: <5 mL.  IMPLANTS: * No implants in log *   DRAINS: None  COMPLICATIONS: None  DESCRIPTION OF PROCEDURE:  The patient was met in the preoperative holding area where the surgical site was marked and the informed consent form was signed.  The patient was then brought back to the operating room and remained on the stretcher.  A hand table was placed adjacent to the operative extremity and locked into place.  A tourniquet was placed on the right forearm.  A formal timeout was performed to confirm that this was the correct patient, surgical side, surgical site, and surgical procedure.  All were present and in agreement. Following formal timeout, a local block was performed using 10 mL of 0.25% plain marcaine .  The right upper extremity was then prepped and draped in the usual and sterile fashion.   The limb was exsanguinated and the tourniquet inflated to 250 mmHg.  A longitudinal incision was made over the middle and ring finger A1 pulley.s  The skin was incised.  Blunt dissection was used to identify the A1 pulley.  I began with the middle finger.  Two Ragnell retractors were placed on the radial and ulnar sides of the pulley to protect the  respective neurovascular bundles.  A third Ragnell was placed at the distal aspect of the wound.  The A1 pulley was clearly identified.  Under direct visualization, the pulley was entered sharply using a 15 blade.  Tenotomy scissors were used to complete the pulley release distally to the level of the A2 pulley.  The distal retractor was then placed in the proximal aspect of the wound.  Under direct visualization, the proximal aspect of the A1 pulley was completely released. The same procedure was performed for the ring finger.   Following satisfactory A1 pulley release, the patient was reversed from sedation and asked to fully flex and extend the involved finger.  There was no catching or triggering present.  The tourniquet was let down and hemostasis achieved with direct pressure and bipolar electrocautery.  The wounds were then thoroughly irrigated.  They were closed using 4-0 vicryl rapide sutures in a horizontal mattress fashion.  The wound was dressed with xeroform, 4x4, and an ace wrap.   The patient was reversed from sedation.  All counts were correct x 2 at the end of the procedure.  The patient was then taken to the PACU in stable condition.   POSTOPERATIVE PLAN: She will be discharged to home with appropriate pain medication and discharge instructions.  I'll see her back in 10-14 days for her first postop visit.   Carlin Galla, MD 9:10 AM

## 2023-12-20 ENCOUNTER — Encounter (HOSPITAL_BASED_OUTPATIENT_CLINIC_OR_DEPARTMENT_OTHER): Payer: Self-pay | Admitting: Orthopedic Surgery

## 2024-01-16 ENCOUNTER — Other Ambulatory Visit: Admitting: Urology

## 2024-01-30 ENCOUNTER — Ambulatory Visit (INDEPENDENT_AMBULATORY_CARE_PROVIDER_SITE_OTHER): Admitting: Urology

## 2024-01-30 ENCOUNTER — Encounter: Payer: Self-pay | Admitting: Urology

## 2024-01-30 VITALS — BP 137/79 | HR 64

## 2024-01-30 DIAGNOSIS — Z8551 Personal history of malignant neoplasm of bladder: Secondary | ICD-10-CM

## 2024-01-30 DIAGNOSIS — C672 Malignant neoplasm of lateral wall of bladder: Secondary | ICD-10-CM

## 2024-01-30 DIAGNOSIS — Z08 Encounter for follow-up examination after completed treatment for malignant neoplasm: Secondary | ICD-10-CM | POA: Diagnosis not present

## 2024-01-30 LAB — URINALYSIS, ROUTINE W REFLEX MICROSCOPIC
Bilirubin, UA: NEGATIVE
Glucose, UA: NEGATIVE
Ketones, UA: NEGATIVE
Leukocytes,UA: NEGATIVE
Nitrite, UA: NEGATIVE
Protein,UA: NEGATIVE
RBC, UA: NEGATIVE
Specific Gravity, UA: <1.005 — ABNORMAL LOW (ref 1.005–1.030)
Urobilinogen, Ur: 0.2 mg/dL (ref 0.2–1.0)
pH, UA: 6.5 (ref 5.0–7.5)

## 2024-01-30 MED ORDER — CIPROFLOXACIN HCL 500 MG PO TABS
500.0000 mg | ORAL_TABLET | Freq: Once | ORAL | Status: AC
  Administered 2024-01-30: 500 mg via ORAL

## 2024-01-30 NOTE — Progress Notes (Signed)
   01/30/24  CC: followup bladder cancer   HPI: Julie Villa is a 63yo here for followup for low grade bladder cancer diagnosed in 2023.  Blood pressure 137/79, pulse 64, last menstrual period 04/06/2011. NED. A&Ox3.   No respiratory distress   Abd soft, NT, ND Normal external genitalia with patent urethral meatus  Cystoscopy Procedure Note  Patient identification was confirmed, informed consent was obtained, and patient was prepped using Betadine solution.  Lidocaine  jelly was administered per urethral meatus.    Procedure: - Flexible cystoscope introduced, without any difficulty.   - Thorough search of the bladder revealed:    normal urethral meatus    normal urothelium    no stones    no ulcers     no tumors    no urethral polyps    no trabeculation  - Ureteral orifices were normal in position and appearance.  Post-Procedure: - Patient tolerated the procedure well  Assessment/ Plan: Urine for cytology Followup 6 months for cystoscopy   No follow-ups on file.  Julie Clara, MD

## 2024-01-30 NOTE — Patient Instructions (Signed)

## 2024-01-31 LAB — CYTOLOGY, URINE

## 2024-03-14 ENCOUNTER — Encounter: Payer: 59 | Admitting: Adult Health

## 2024-03-20 ENCOUNTER — Inpatient Hospital Stay: Admitting: Adult Health

## 2024-03-24 ENCOUNTER — Encounter: Payer: Self-pay | Admitting: Adult Health

## 2024-03-24 ENCOUNTER — Inpatient Hospital Stay: Attending: Adult Health | Admitting: Adult Health

## 2024-03-24 VITALS — BP 117/62 | HR 66 | Temp 97.8°F | Resp 16 | Ht 65.0 in | Wt 160.2 lb

## 2024-03-24 DIAGNOSIS — Z9049 Acquired absence of other specified parts of digestive tract: Secondary | ICD-10-CM | POA: Insufficient documentation

## 2024-03-24 DIAGNOSIS — Z79811 Long term (current) use of aromatase inhibitors: Secondary | ICD-10-CM | POA: Insufficient documentation

## 2024-03-24 DIAGNOSIS — Z87891 Personal history of nicotine dependence: Secondary | ICD-10-CM | POA: Diagnosis not present

## 2024-03-24 DIAGNOSIS — Z8249 Family history of ischemic heart disease and other diseases of the circulatory system: Secondary | ICD-10-CM | POA: Insufficient documentation

## 2024-03-24 DIAGNOSIS — R9230 Dense breasts, unspecified: Secondary | ICD-10-CM | POA: Insufficient documentation

## 2024-03-24 DIAGNOSIS — Z79810 Long term (current) use of selective estrogen receptor modulators (SERMs): Secondary | ICD-10-CM | POA: Insufficient documentation

## 2024-03-24 DIAGNOSIS — Z8551 Personal history of malignant neoplasm of bladder: Secondary | ICD-10-CM | POA: Insufficient documentation

## 2024-03-24 DIAGNOSIS — Z803 Family history of malignant neoplasm of breast: Secondary | ICD-10-CM | POA: Insufficient documentation

## 2024-03-24 DIAGNOSIS — Z79899 Other long term (current) drug therapy: Secondary | ICD-10-CM | POA: Diagnosis not present

## 2024-03-24 DIAGNOSIS — Z9841 Cataract extraction status, right eye: Secondary | ICD-10-CM | POA: Diagnosis not present

## 2024-03-24 DIAGNOSIS — Z853 Personal history of malignant neoplasm of breast: Secondary | ICD-10-CM | POA: Diagnosis present

## 2024-03-24 DIAGNOSIS — Z833 Family history of diabetes mellitus: Secondary | ICD-10-CM | POA: Insufficient documentation

## 2024-03-24 DIAGNOSIS — N3281 Overactive bladder: Secondary | ICD-10-CM | POA: Insufficient documentation

## 2024-03-24 DIAGNOSIS — Z83438 Family history of other disorder of lipoprotein metabolism and other lipidemia: Secondary | ICD-10-CM | POA: Diagnosis not present

## 2024-03-24 DIAGNOSIS — Z923 Personal history of irradiation: Secondary | ICD-10-CM | POA: Insufficient documentation

## 2024-03-24 DIAGNOSIS — Z880 Allergy status to penicillin: Secondary | ICD-10-CM | POA: Diagnosis not present

## 2024-03-24 NOTE — Progress Notes (Signed)
 [facility] Cancer Follow up:    Julie Senior, MD [address] [place] [address]   DIAGNOSIS: History of breast cancer    SUMMARY OF ONCOLOGIC HISTORY: Oncology History  Carcinoma of upper-inner quadrant of left female breast (HCC) (Resolved)  09/21/2010 Surgery   Left breast lumpectomy invasive low grade ductal carcinoma 0.6 cm with low-grade DCIS invasive lobular cancer spent 0.3 cm grade 1; 2 sentinel nodes negative   11/09/2010 - 01/08/2011 Radiation Therapy   Adjuvant radiation (dates were estimated by the patient)   02/01/2011 - 01/2016 Anti-estrogen oral therapy   Tamoxifen  20 mg by mouth daily switched to anastrozole  1 mg daily February 2016 (after she became postmenopausal)    Miscellaneous   BCI testing sent and showed low likelihood of benefit of extending anti-estrogen therapy past 5 years     CURRENT THERAPY: observation  INTERVAL HISTORY:  Discussed the use of AI scribe software for clinical note transcription with the patient, who gave verbal consent to proceed.  History of Present Illness Julie Villa is a 63 year old woman with left breast invasive ductal carcinoma, status post lumpectomy, adjuvant radiation, and anti-estrogen therapy, presenting for routine oncology surveillance.  She was diagnosed with left breast invasive ductal carcinoma in 2012 and underwent lumpectomy, adjuvant radiation therapy, and anti-estrogen therapy (tamoxifen  then anastrozole ), completed in October 2017.  She has no breast pain, palpable masses, or breast changes. Her most recent mammogram in June 2025 was normal, with breast density category B.  She denies cough, dyspnea, chest pain, and palpitations. She exercises regularly, including dancing several days per week, and wishes to increase her activity.  She continues routine follow-up with her primary care provider and is up to date with colonoscopy. She has not had a Pap smear  this year.     Patient Active Problem List   Diagnosis Date Noted   Iron deficiency anemia 02/28/2021   Atrophic vaginitis 02/28/2021   Osteopenia of neck of left femur 02/27/2020   Cancer of lateral wall of urinary bladder (HCC) 02/12/2020   Acute cystitis without hematuria 02/12/2020   Scoliosis deformity of spine 08/31/2017   Degeneration of lumbar intervertebral disc 07/31/2017   Hx Breast cancer, Left, UIQ, IDC and ILC with LCIS, Stage I 09/30/2010    is allergic to penicillins and sulfa antibiotics.  MEDICAL HISTORY: Past Medical History:  Diagnosis Date   Anemia    hx of several yrs ago   Bladder tumor    Breast cancer (HCC) 2012   left   Frequency of urination    Hematuria    History of left breast cancer dx 2012  --- oncologist-  dr odean ([facility])-- per lov note, no recurrence   Left UIQ - Invasive ductial carcinoma, Invasive Lobular carcinoma w/ LCIS; Stage IA (T1b, N0, M0)--- s/p breast lumpectomy w/ snl dissection and radiation therapy completed 01-03-2011   History of radiation therapy completed 01-03-2011   total 61Gy  , left breast    History of radiation therapy 2012   OAB (overactive bladder)    Personal history of radiation therapy    Wears glasses     SURGICAL HISTORY: Past Surgical History:  Procedure Laterality Date   APPENDECTOMY  1987   BREAST LUMPECTOMY Left 2012   BREAST LUMPECTOMY WITH AXILLARY LYMPH NODE DISSECTION Left 09-21-2010  dr merrilyn  [facility]   CATARACT EXTRACTION W/ INTRAOCULAR LENS IMPLANT Right 06/2016   CYSTOSCOPY N/A 02/05/2020  Procedure: CYSTOSCOPY;  Surgeon: Sherrilee Belvie CROME, MD;  Location: [facility];  Service: Urology;  Laterality: N/A;   CYSTOSCOPY W/ RETROGRADES Bilateral 01/19/2017   Procedure: CYSTOSCOPY WITH RETROGRADE PYELOGRAM;  Surgeon: Sherrilee Belvie CROME, MD;  Location: [facility];  Service: Urology;  Laterality: Bilateral;   CYSTOSCOPY W/ RETROGRADES Bilateral 08/08/2021   Procedure:  CYSTOSCOPY WITH RETROGRADE PYELOGRAM;  Surgeon: Sherrilee Belvie CROME, MD;  Location: [facility];  Service: Urology;  Laterality: Bilateral;   NASAL SINUS SURGERY  06/2008   TRANSURETHRAL RESECTION OF BLADDER TUMOR N/A 01/19/2017   Procedure: TRANSURETHRAL RESECTION OF BLADDER TUMOR (TURBT);  Surgeon: Sherrilee Belvie CROME, MD;  Location: [facility];  Service: Urology;  Laterality: N/A;   TRANSURETHRAL RESECTION OF BLADDER TUMOR N/A 11/29/2017   Procedure: TRANSURETHRAL RESECTION OF BLADDER TUMOR (TURBT);  Surgeon: Sherrilee Belvie CROME, MD;  Location[facility];  Service: Urology;  Laterality: N/A;   TRANSURETHRAL RESECTION OF BLADDER TUMOR N/A 02/05/2020   Procedure: TRANSURETHRAL RESECTION OF BLADDER TUMOR (TURBT);  Surgeon: Sherrilee Belvie CROME, MD;  Location: [facility];  Service: Urology;  Laterality: N/A;   TRANSURETHRAL RESECTION OF BLADDER TUMOR N/A 08/08/2021   Procedure: TRANSURETHRAL RESECTION OF BLADDER TUMOR (TURBT);  Surgeon: Sherrilee Belvie CROME, MD;  Location: [facility];  Service: Urology;  Laterality: N/A;   TRIGGER FINGER RELEASE Right 12/19/2023   Procedure: RELEASE, A1 PULLEY, FOR TRIGGER FINGER;  Surgeon: Romona Harari, MD;  Location: [facility];  Service: Orthopedics;  Laterality: Right;  middle and ring finger    SOCIAL HISTORY: Social History   Socioeconomic History   Marital status: Married    Spouse name: Not on file   Number of children: Not on file   Years of education: Not on file   Highest education level: Not on file  Occupational History   Not on file  Tobacco Use   Smoking status: Former    Current packs/day: 0.00    Types: Cigarettes    Start date: 01/18/1979    Quit date: 01/17/1982    Years since quitting: 42.2   Smokeless tobacco: Never  Vaping Use   Vaping status: Never Used  Substance and Sexual Activity   Alcohol use: Yes    Alcohol/week: 1.0 standard drink of alcohol    Types: 1 Glasses of wine per week     Comment: glass wine couple times a month   Drug use: No   Sexual activity: Yes    Birth control/protection: Post-menopausal  Other Topics Concern   Not on file  Social History Narrative   Not on file   Social Drivers of Health   Tobacco Use: Medium Risk (03/24/2024)   Patient History    Smoking Tobacco Use: Former    Smokeless Tobacco Use: Never    Passive Exposure: Not on file  Financial Resource Strain: Low Risk (03/24/2024)   Overall Financial Resource Strain (CARDIA)    Difficulty of Paying Living Expenses: Not hard at all  Food Insecurity: No Food Insecurity (03/24/2024)   Epic    Worried About Radiation Protection Practitioner of Food in the Last Year: Never true    Ran Out of Food in the Last Year: Never true  Transportation Needs: No Transportation Needs (03/24/2024)   Epic    Lack of Transportation (Medical): No    Lack of Transportation (Non-Medical): No  Physical Activity: Inactive (03/24/2024)   Exercise Vital Sign    Days of Exercise per Week: 0 days  Minutes of Exercise per Session: 0 min  Stress: No Stress Concern Present (03/24/2024)   [employer] of Occupational Health - Occupational Stress Questionnaire    Feeling of Stress: Not at all  Social Connections: Socially Integrated (03/24/2024)   Social Connection and Isolation Panel    Frequency of Communication with Friends and Family: More than three times a week    Frequency of Social Gatherings with Friends and Family: Once a week    Attends Religious Services: More than 4 times per year    Active Member of Clubs or Organizations: Yes    Attends Banker Meetings: 1 to 4 times per year    Marital Status: Married  Catering Manager Violence: Not At Risk (03/24/2024)   Epic    Fear of Current or Ex-Partner: No    Emotionally Abused: No    Physically Abused: No    Sexually Abused: No  Depression (PHQ2-9): Low Risk (03/24/2024)   Depression (PHQ2-9)    PHQ-2 Score: 0  Alcohol Screen: Low Risk (03/24/2024)    Alcohol Screen    Last Alcohol Screening Score (AUDIT): 0  Housing: Unknown (03/24/2024)   Epic    Unable to Pay for Housing in the Last Year: No    Number of Times Moved in the Last Year: Not on file    Homeless in the Last Year: No  Utilities: Not At Risk (03/24/2024)   Epic    Threatened with loss of utilities: No  Health Literacy: Adequate Health Literacy (03/24/2024)   B1300 Health Literacy    Frequency of need for help with medical instructions: Never    FAMILY HISTORY: Family History  Problem Relation Age of Onset   Breast cancer Mother    Cancer Mother        breast   Hypertension Mother    Hyperlipidemia Mother    Diabetes Father    Heart disease Father    Hyperlipidemia Father    Hypertension Father    Diabetes Sister    Hypertension Sister    Hyperlipidemia Sister     Review of Systems - Oncology    PHYSICAL EXAMINATION   Onc Performance Status - 03/24/24 0912       ECOG Perf Status   ECOG Perf Status Restricted in physically strenuous activity but ambulatory and able to carry out work of a light or sedentary nature, e.g., light house work, office work (P)       KPS SCALE   KPS % SCORE Able to carry on normal activity, minor s/s of disease (P)           Vitals:   03/24/24 0859  BP: 117/62  Pulse: 66  Resp: 16  Temp: 97.8 F (36.6 C)  SpO2: 100%    Physical Exam Constitutional:      General: She is not in acute distress.    Appearance: Normal appearance. She is not toxic-appearing.  HENT:     Head: Normocephalic and atraumatic.     Mouth/Throat:     Mouth: Mucous membranes are moist.     Pharynx: Oropharynx is clear. No oropharyngeal exudate or posterior oropharyngeal erythema.  Eyes:     General: No scleral icterus. Cardiovascular:     Rate and Rhythm: Normal rate and regular rhythm.     Pulses: Normal pulses.     Heart sounds: Normal heart sounds.  Pulmonary:     Effort: Pulmonary effort is normal.     Breath sounds: Normal  breath sounds.  Chest:     Comments: Left breast s/p lumpectomy and radiation, no sign of local recurrence, right breast benign Abdominal:     General: Abdomen is flat. Bowel sounds are normal. There is no distension.     Palpations: Abdomen is soft.     Tenderness: There is no abdominal tenderness.  Musculoskeletal:        General: No swelling.     Cervical back: Neck supple.  Lymphadenopathy:     Cervical: No cervical adenopathy.     Upper Body:     Right upper body: No supraclavicular or axillary adenopathy.     Left upper body: No supraclavicular or axillary adenopathy.  Skin:    General: Skin is warm and dry.     Findings: No rash.  Neurological:     General: No focal deficit present.     Mental Status: She is alert.  Psychiatric:        Mood and Affect: Mood normal.        Behavior: Behavior normal.     LABORATORY DATA:  CBC    Component Value Date/Time   WBC 5.3 08/04/2021 1347   RBC 4.06 08/04/2021 1347   HGB 12.8 08/04/2021 1347   HGB 12.2 12/06/2015 1344   HCT 39.2 08/04/2021 1347   HCT 35.5 12/06/2015 1344   PLT 225 08/04/2021 1347   PLT 193 12/06/2015 1344   MCV 96.6 08/04/2021 1347   MCV 92.9 12/06/2015 1344   MCH 31.5 08/04/2021 1347   MCHC 32.7 08/04/2021 1347   RDW 11.7 08/04/2021 1347   RDW 11.9 12/06/2015 1344   LYMPHSABS 1.6 08/04/2021 1347   LYMPHSABS 1.6 12/06/2015 1344   MONOABS 0.4 08/04/2021 1347   MONOABS 0.3 12/06/2015 1344   EOSABS 0.2 08/04/2021 1347   EOSABS 0.2 12/06/2015 1344   BASOSABS 0.1 08/04/2021 1347   BASOSABS 0.0 12/06/2015 1344    CMP     Component Value Date/Time   NA 140 01/26/2021 1512   NA 143 12/07/2014 1329   K 4.9 01/26/2021 1512   K 4.3 12/07/2014 1329   CL 104 01/26/2021 1512   CL 107 05/21/2012 0936   CO2 25 01/26/2021 1512   CO2 30 (H) 12/07/2014 1329   GLUCOSE 93 01/26/2021 1512   GLUCOSE 91 02/03/2020 1555   GLUCOSE 86 12/07/2014 1329   GLUCOSE 66 (L) 05/21/2012 0936   BUN 14 01/26/2021 1512    BUN 16.3 12/07/2014 1329   CREATININE 0.93 01/26/2021 1512   CREATININE 0.9 12/07/2014 1329   CALCIUM 9.8 01/26/2021 1512   CALCIUM 9.8 12/07/2014 1329   PROT 6.8 12/07/2014 1329   ALBUMIN 4.0 12/07/2014 1329   AST 16 12/07/2014 1329   ALT 12 12/07/2014 1329   ALKPHOS 72 12/07/2014 1329   BILITOT 0.94 12/07/2014 1329   GFRNONAA >60 02/03/2020 1555     ASSESSMENT and THERAPY PLAN:   Assessment and Plan Assessment & Plan Personal history of left breast invasive ductal carcinoma Status post lumpectomy, adjuvant radiation, and anti-estrogen therapy completed October 2017. Asymptomatic with no recurrence. Normal mammogram June 2025. Reliable mammographic surveillance due to breast density. Engages in routine preventive care. - Ordered screening mammogram for June 2026. - Advised prompt reporting of new symptoms or concerns. - Encouraged regular physical activity and diet high in fruits and vegetables. - Reinforced adherence to routine preventive care, including colonoscopy and Pap smear per guidelines.  RTC in 1 year for continued long-term surveillance.       All  questions were answered. The patient knows to call the clinic with any problems, questions or concerns. We can certainly see the patient much sooner if necessary.  Total encounter time:20 minutes*in face-to-face visit time, chart review, lab review, care coordination, order entry, and documentation of the encounter time.    Morna Kendall, NP 03/24/2024 9:35 AM Medical Oncology and Hematology [facility] [address] [place], [place] [address] Tel. [phone]    Fax. [phone]  *Total Encounter Time as defined by the Centers for Medicare and Medicaid Services includes, in addition to the face-to-face time of a patient visit (documented in the note above) non-face-to-face time: obtaining and reviewing outside history, ordering and reviewing medications, tests or procedures, care coordination  (communications with other health care professionals or caregivers) and documentation in the medical record.

## 2024-03-29 ENCOUNTER — Other Ambulatory Visit: Payer: Self-pay | Admitting: Urology

## 2024-03-29 DIAGNOSIS — N3021 Other chronic cystitis with hematuria: Secondary | ICD-10-CM

## 2024-07-30 ENCOUNTER — Other Ambulatory Visit: Admitting: Urology

## 2025-03-24 ENCOUNTER — Inpatient Hospital Stay: Attending: Adult Health | Admitting: Adult Health
